# Patient Record
Sex: Female | Born: 1960
Health system: Southern US, Community
[De-identification: ages and names within clinical notes are randomized; demographics above are authoritative.]

## PROBLEM LIST (undated history)

## (undated) DIAGNOSIS — F32A Depression, unspecified: Secondary | ICD-10-CM

## (undated) DIAGNOSIS — F329 Major depressive disorder, single episode, unspecified: Secondary | ICD-10-CM

## (undated) DIAGNOSIS — G9081 Serotonin syndrome: Secondary | ICD-10-CM

## (undated) DIAGNOSIS — Z8619 Personal history of other infectious and parasitic diseases: Secondary | ICD-10-CM

## (undated) DIAGNOSIS — I341 Nonrheumatic mitral (valve) prolapse: Secondary | ICD-10-CM

## (undated) DIAGNOSIS — E785 Hyperlipidemia, unspecified: Secondary | ICD-10-CM

## (undated) DIAGNOSIS — K589 Irritable bowel syndrome without diarrhea: Secondary | ICD-10-CM

## (undated) DIAGNOSIS — G2579 Other drug induced movement disorders: Secondary | ICD-10-CM

## (undated) DIAGNOSIS — M797 Fibromyalgia: Secondary | ICD-10-CM

## (undated) DIAGNOSIS — F419 Anxiety disorder, unspecified: Secondary | ICD-10-CM

## (undated) DIAGNOSIS — R002 Palpitations: Secondary | ICD-10-CM

## (undated) DIAGNOSIS — K219 Gastro-esophageal reflux disease without esophagitis: Secondary | ICD-10-CM

## (undated) DIAGNOSIS — M1990 Unspecified osteoarthritis, unspecified site: Secondary | ICD-10-CM

## (undated) DIAGNOSIS — K8020 Calculus of gallbladder without cholecystitis without obstruction: Secondary | ICD-10-CM

## (undated) DIAGNOSIS — M419 Scoliosis, unspecified: Secondary | ICD-10-CM

## (undated) HISTORY — DX: Unspecified osteoarthritis, unspecified site: M19.90

## (undated) HISTORY — DX: Scoliosis, unspecified: M41.9

## (undated) HISTORY — DX: Palpitations: R00.2

## (undated) HISTORY — DX: Nonrheumatic mitral (valve) prolapse: I34.1

## (undated) HISTORY — DX: Calculus of gallbladder without cholecystitis without obstruction: K80.20

## (undated) HISTORY — DX: Fibromyalgia: M79.7

## (undated) HISTORY — DX: Other drug induced movement disorders: G25.79

## (undated) HISTORY — DX: Gastro-esophageal reflux disease without esophagitis: K21.9

## (undated) HISTORY — DX: Personal history of other infectious and parasitic diseases: Z86.19

## (undated) HISTORY — DX: Serotonin syndrome: G90.81

## (undated) HISTORY — DX: Hyperlipidemia, unspecified: E78.5

## (undated) HISTORY — DX: Anxiety disorder, unspecified: F41.9

## (undated) HISTORY — DX: Irritable bowel syndrome, unspecified: K58.9

---
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
## 1995-11-28 HISTORY — PX: CHOLECYSTECTOMY: SHX55

## 2002-01-26 ENCOUNTER — Emergency Department (HOSPITAL_COMMUNITY): Admission: EM | Admit: 2002-01-26 | Discharge: 2002-01-26 | Payer: Self-pay | Admitting: Emergency Medicine

## 2002-01-26 ENCOUNTER — Encounter: Payer: Self-pay | Admitting: Emergency Medicine

## 2002-08-06 ENCOUNTER — Encounter: Payer: Self-pay | Admitting: Internal Medicine

## 2002-08-06 ENCOUNTER — Encounter: Admission: RE | Admit: 2002-08-06 | Discharge: 2002-08-06 | Payer: Self-pay | Admitting: Internal Medicine

## 2003-06-01 ENCOUNTER — Emergency Department (HOSPITAL_COMMUNITY): Admission: EM | Admit: 2003-06-01 | Discharge: 2003-06-02 | Payer: Self-pay | Admitting: Emergency Medicine

## 2004-03-03 ENCOUNTER — Other Ambulatory Visit: Admission: RE | Admit: 2004-03-03 | Discharge: 2004-03-03 | Payer: Self-pay | Admitting: Family Medicine

## 2005-01-13 ENCOUNTER — Ambulatory Visit: Payer: Self-pay | Admitting: Internal Medicine

## 2005-01-20 ENCOUNTER — Ambulatory Visit: Payer: Self-pay | Admitting: Internal Medicine

## 2005-03-06 ENCOUNTER — Ambulatory Visit: Payer: Self-pay | Admitting: Family Medicine

## 2005-03-08 ENCOUNTER — Emergency Department: Payer: Self-pay | Admitting: Emergency Medicine

## 2005-04-11 ENCOUNTER — Ambulatory Visit: Payer: Self-pay | Admitting: Internal Medicine

## 2005-05-04 ENCOUNTER — Ambulatory Visit: Payer: Self-pay | Admitting: Internal Medicine

## 2005-08-22 ENCOUNTER — Ambulatory Visit (HOSPITAL_COMMUNITY): Admission: RE | Admit: 2005-08-22 | Discharge: 2005-08-22 | Payer: Self-pay | Admitting: *Deleted

## 2005-08-26 ENCOUNTER — Encounter: Admission: RE | Admit: 2005-08-26 | Discharge: 2005-08-26 | Payer: Self-pay | Admitting: *Deleted

## 2006-06-20 ENCOUNTER — Ambulatory Visit: Payer: Self-pay | Admitting: Family Medicine

## 2007-07-15 ENCOUNTER — Telehealth: Payer: Self-pay | Admitting: Internal Medicine

## 2007-07-15 DIAGNOSIS — R002 Palpitations: Secondary | ICD-10-CM | POA: Insufficient documentation

## 2007-07-30 ENCOUNTER — Ambulatory Visit: Payer: Self-pay | Admitting: Cardiology

## 2007-07-30 ENCOUNTER — Ambulatory Visit: Payer: Self-pay

## 2007-07-30 LAB — CONVERTED CEMR LAB
Basophils Relative: 0.6 % (ref 0.0–1.0)
Eosinophils Absolute: 0.2 10*3/uL (ref 0.0–0.6)
Lymphocytes Relative: 22 % (ref 12.0–46.0)
Monocytes Relative: 6.1 % (ref 3.0–11.0)
Neutro Abs: 5.2 10*3/uL (ref 1.4–7.7)
Platelets: 315 10*3/uL (ref 150–400)
RBC: 4.31 M/uL (ref 3.87–5.11)
TSH: 0.9 microintl units/mL (ref 0.35–5.50)

## 2007-10-05 ENCOUNTER — Telehealth: Payer: Self-pay | Admitting: Internal Medicine

## 2008-04-02 ENCOUNTER — Telehealth (INDEPENDENT_AMBULATORY_CARE_PROVIDER_SITE_OTHER): Payer: Self-pay | Admitting: *Deleted

## 2008-04-27 ENCOUNTER — Ambulatory Visit: Payer: Self-pay | Admitting: Family Medicine

## 2008-04-27 DIAGNOSIS — M129 Arthropathy, unspecified: Secondary | ICD-10-CM | POA: Insufficient documentation

## 2008-08-18 ENCOUNTER — Encounter: Admission: RE | Admit: 2008-08-18 | Discharge: 2008-08-18 | Payer: Self-pay | Admitting: Gastroenterology

## 2010-01-21 ENCOUNTER — Other Ambulatory Visit: Admission: RE | Admit: 2010-01-21 | Discharge: 2010-01-21 | Payer: Self-pay | Admitting: Family Medicine

## 2010-04-13 ENCOUNTER — Emergency Department (HOSPITAL_COMMUNITY): Admission: EM | Admit: 2010-04-13 | Discharge: 2010-04-13 | Payer: Self-pay | Admitting: Emergency Medicine

## 2010-10-07 ENCOUNTER — Ambulatory Visit: Payer: Self-pay | Admitting: Internal Medicine

## 2010-10-07 DIAGNOSIS — S335XXA Sprain of ligaments of lumbar spine, initial encounter: Secondary | ICD-10-CM | POA: Insufficient documentation

## 2010-10-07 DIAGNOSIS — M4120 Other idiopathic scoliosis, site unspecified: Secondary | ICD-10-CM | POA: Insufficient documentation

## 2010-12-08 ENCOUNTER — Ambulatory Visit
Admission: RE | Admit: 2010-12-08 | Discharge: 2010-12-08 | Payer: Self-pay | Source: Home / Self Care | Attending: Family Medicine | Admitting: Family Medicine

## 2010-12-08 DIAGNOSIS — J069 Acute upper respiratory infection, unspecified: Secondary | ICD-10-CM | POA: Insufficient documentation

## 2010-12-27 NOTE — Assessment & Plan Note (Signed)
Summary: BACK PAIN/CLE   Vital Signs:  Patient profile:   50 year old female Weight:      188 pounds Temp:     98.5 degrees F oral Pulse rate:   92 / minute Pulse rhythm:   regular BP sitting:   112 / 90  (left arm) Cuff size:   regular  Vitals Entered By: Delilah Shan CMA Duncan Dull) (October 07, 2010 4:14 PM) CC: Back pain   History of Present Illness: CC: back pain  presents with husband  4d h/o back pain R lower back/buttock.  pain down into hip.  throbbing pain.  No shooting pain down leg, no tingling/numbness/weakness.  No fevers/chills. No injury or falls on back.  Started when sweeping floor.  no bowel/bladder incontinence.  Trouble sleeping.  Tried tylenol, alleve and tramadol which isn't helping.  Alleve causes stomach upset.  tried flexeril as well.  heating pad hasn't helped.  h/o scoliosis in lower back around same area.  has had pain like this in past, usually resolves in 2-3 days.  PCP is [facility].  comes here because closer to home.  Current Medications (verified): 1)  Pantoprazole Sodium 40 Mg  Tbec (Pantoprazole Sodium) .... Take 1 Tablet By Mouth Once A Day 2)  Tramadol Hcl 50 Mg  Tabs (Tramadol Hcl) .... Take One Twice Daily As Needed 3)  Flexeril 10 Mg  Tabs (Cyclobenzaprine Hcl) .... 1/2 To 1 Every 8 Hrs As Needed Muscle Pain 4)  Alprazolam 0.25 Mg Tabs (Alprazolam) .... Take 1 By Mouth As Needed  Allergies: 1)  Nsaids  Past History:  Past Medical History: fibromyalgia arthritis h/o scoliosis  Past Surgical History: C/S cholecystectomy appendectomy  Social History: no smoking  Review of Systems       per HPI  Physical Exam  General:  alert, well-developed, well-nourished, and well-hydrated.  uncomfortable sitting in exam room, moves from 1 buttocks to other Msk:  no tenerness midline or paraspinous mm.  no point tenderness SI or greater trochanter.  + tenderness right mid buttock. pain with flexion/extension.  no pain with  lateral rotation or flexion. Negative SLR bilaterally. tenderness with FABER bilaterlaly R >L Pulses:  2+ dp/ pt bilat Extremities:  no edema Neurologic:  sensation intact to light touch and gait normal.     Impression & Recommendations:  Problem # 1:  LUMBAR SPRAIN AND STRAIN (ICD-847.2) lumbar strain vs mild sciatica.  treat with steroid course as intolerant to NSAIDs, tramadol not helping short course of vicodin for breakthrough pain  Problem # 2:  SCOLIOSIS, LUMBAR SPINE (ICD-737.30) exacerbating #1  Complete Medication List: 1)  Pantoprazole Sodium 40 Mg Tbec (Pantoprazole sodium) .... Take 1 tablet by mouth once a day 2)  Tramadol Hcl 50 Mg Tabs (Tramadol hcl) .... Take one twice daily as needed 3)  Flexeril 10 Mg Tabs (Cyclobenzaprine hcl) .... 1/2 to 1 every 8 hrs as needed muscle pain 4)  Alprazolam 0.25 Mg Tabs (Alprazolam) .... Take 1 by mouth as needed 5)  Prednisone 20 Mg Tabs (Prednisone) .... Take one twice daily for 7 days 6)  Vicodin 5-500 Mg Tabs (Hydrocodone-acetaminophen) .... Take one by mouth q6 hours as needed pain  Patient Instructions: 1)  I think you have inflmamation in your spine causing nerve irritation. 2)  treat with course of steroids for 7 days. 3)  course of vicodin. 4)  Continue flexeril, tramadol, tylenol, and heat. 5)  Good to see you today, call clinic with questions Prescriptions: VICODIN  5-500 MG TABS (HYDROCODONE-ACETAMINOPHEN) take one by mouth q6 hours as needed pain  #20 x 0   Entered and Authorized by:   Eustaquio Boyden  MD   Signed by:   Eustaquio Boyden  MD on 10/07/2010   Method used:   Print then Give to Patient   RxID:   [id number] PREDNISONE 20 MG TABS (PREDNISONE) take one twice daily for 7 days  #14 x 0   Entered and Authorized by:   Eustaquio Boyden  MD   Signed by:   Eustaquio Boyden  MD on 10/07/2010   Method used:   Electronically to        [facility]* (retail)       [address]       [place], [place]   [address]       Ph: [phone]       Fax: [phone]   RxID:   [id number]    Orders Added: 1)  Est. Patient Level III [64403]    Current Allergies (reviewed today): NSAIDS

## 2010-12-29 NOTE — Assessment & Plan Note (Signed)
Summary: ?SINUS INFECTION/CLE   Vital Signs:  Patient profile:   50 year old female Weight:      189 pounds Temp:     98.7 degrees F oral Pulse rate:   88 / minute Pulse rhythm:   regular BP sitting:   110 / 78  (left arm) Cuff size:   large  Vitals Entered By: Sydell Axon LPN (December 08, 2010 3:38 PM) CC: Has had a cold and fever X 3 weeks   History of Present Illness: Pt has had cold for three weeks. It began as congestion, ST, swollen glands and intestinal trouble. Now having fevers. The past few days has had white spot on the back of the throat. She has had 99.5 temp for the last few days at night, sha has cough productive of unknown color sputum. She has headache in the posterior occipital area with swollen glands behind the ears, ear congestion that has improved, rhinitis that is clear, ST that continues and cough productive from PND. She has had stomach upset with one day of N/V initially which has improved. She is achey and has been.  Problems Prior to Update: 1)  Scoliosis, Lumbar Spine  (ICD-737.30) 2)  Lumbar Sprain and Strain  (ICD-847.2) 3)  Unspecified Arthropathy Multiple Sites  (ICD-716.99) 4)  Palpitations  (ICD-785.1)  Medications Prior to Update: 1)  Pantoprazole Sodium 40 Mg  Tbec (Pantoprazole Sodium) .... Take 1 Tablet By Mouth Once A Day 2)  Tramadol Hcl 50 Mg  Tabs (Tramadol Hcl) .... Take One Twice Daily As Needed 3)  Flexeril 10 Mg  Tabs (Cyclobenzaprine Hcl) .... 1/2 To 1 Every 8 Hrs As Needed Muscle Pain 4)  Alprazolam 0.25 Mg Tabs (Alprazolam) .... Take 1 By Mouth As Needed 5)  Prednisone 20 Mg Tabs (Prednisone) .... Take One Twice Daily For 7 Days 6)  Vicodin 5-500 Mg Tabs (Hydrocodone-Acetaminophen) .... Take One By Mouth Q6 Hours As Needed Pain  Current Medications (verified): 1)  Pantoprazole Sodium 40 Mg  Tbec (Pantoprazole Sodium) .... Take 1 Tablet By Mouth Once A Day 2)  Tramadol Hcl 50 Mg  Tabs (Tramadol Hcl) .... Take One Twice Daily As  Needed 3)  Flexeril 10 Mg  Tabs (Cyclobenzaprine Hcl) .... 1/2 To 1 Every 8 Hrs As Needed Muscle Pain 4)  Alprazolam 0.25 Mg Tabs (Alprazolam) .... Take 1 By Mouth As Needed  Allergies: 1)  Nsaids  Physical Exam  General:  Well-developed,well-nourished,in no acute distress; alert,appropriate and cooperative throughout examination, conjgested and coughing some. Head:  Normocephalic and atraumatic without obvious abnormalities. Sinuses NT. Eyes:  Conjunctiva clear bilaterally.  Ears:  External ear exam shows no significant lesions or deformities.  Otoscopic examination reveals clear canals, tympanic membranes are intact bilaterally without bulging, retraction, inflammation or discharge. Hearing is grossly normal bilaterally. Nose:  External nasal examination shows no deformity or inflammation. Nasal mucosa are pink and moist without lesions or exudates. Min clear discharge with septum deviated left. Mouth:  Oral mucosa and oropharynx without lesions or exudates.  Teeth in good repair. Neck:  No deformities, masses, or tenderness noted. Lungs:  Normal respiratory effort, chest expands symmetrically. Lungs are clear to auscultation, no crackles or wheezes. Heart:  Normal rate and regular rhythm. S1 and S2 normal without gallop, murmur, click, rub or other extra sounds.   Impression & Recommendations:  Problem # 1:  URI (ICD-465.9) Assessment New  See instructions.  Instructed on symptomatic treatment. Call if symptoms persist or worsen.   Complete Medication  List: 1)  Pantoprazole Sodium 40 Mg Tbec (Pantoprazole sodium) .... Take 1 tablet by mouth once a day 2)  Tramadol Hcl 50 Mg Tabs (Tramadol hcl) .... Take one twice daily as needed 3)  Flexeril 10 Mg Tabs (Cyclobenzaprine hcl) .... 1/2 to 1 every 8 hrs as needed muscle pain 4)  Alprazolam 0.25 Mg Tabs (Alprazolam) .... Take 1 by mouth as needed  Patient Instructions: 1)  Take Guaifenesin by going to [facility] or [facility] and getting MUCOUS RELIEF EXPECTORANT (400mg ), take 11/2 tabs by mouth AM and NOON. 2)  Drink lots of fluids anytime taking Guaifenesin.  3)  Take Tyl ES 2 tabs three times a day  4)  Gargle as dir.  5)  RTC or call if sxs worsen.   Orders Added: 1)  Est. Patient Level III [91478]    Current Allergies (reviewed today): NSAIDS

## 2011-02-13 LAB — CBC
Platelets: 282 10*3/uL (ref 150–400)
RBC: 4.37 MIL/uL (ref 3.87–5.11)
WBC: 7.4 10*3/uL (ref 4.0–10.5)

## 2011-02-13 LAB — D-DIMER, QUANTITATIVE: D-Dimer, Quant: 0.22 ug/mL-FEU (ref 0.00–0.48)

## 2011-02-13 LAB — POCT CARDIAC MARKERS
CKMB, poc: <1 ng/mL — ABNORMAL LOW (ref 1.0–8.0)
Myoglobin, poc: 47 ng/mL (ref 12–200)
Troponin i, poc: <0.05 ng/mL (ref 0.00–0.09)

## 2011-02-13 LAB — POCT I-STAT, CHEM 8
Chloride: 109 mEq/L (ref 96–112)
HCT: 40 % (ref 36.0–46.0)
Potassium: 3.7 mEq/L (ref 3.5–5.1)

## 2011-04-11 ENCOUNTER — Ambulatory Visit (INDEPENDENT_AMBULATORY_CARE_PROVIDER_SITE_OTHER): Payer: BC Managed Care – PPO | Admitting: Family Medicine

## 2011-04-11 ENCOUNTER — Encounter: Payer: Self-pay | Admitting: Family Medicine

## 2011-04-11 VITALS — BP 92/70 | HR 88 | Temp 98.2°F | Ht 64.0 in | Wt 179.8 lb

## 2011-04-11 DIAGNOSIS — IMO0001 Reserved for inherently not codable concepts without codable children: Secondary | ICD-10-CM

## 2011-04-11 DIAGNOSIS — M797 Fibromyalgia: Secondary | ICD-10-CM | POA: Insufficient documentation

## 2011-04-11 DIAGNOSIS — Z1231 Encounter for screening mammogram for malignant neoplasm of breast: Secondary | ICD-10-CM

## 2011-04-11 DIAGNOSIS — M419 Scoliosis, unspecified: Secondary | ICD-10-CM

## 2011-04-11 DIAGNOSIS — R002 Palpitations: Secondary | ICD-10-CM

## 2011-04-11 NOTE — Assessment & Plan Note (Signed)
Unchanged. Discussed supplements often used for fibromyalgia along with therapies such as water therapy. Handouts given.   Pt will read them and get back to me.

## 2011-04-11 NOTE — Assessment & Plan Note (Signed)
[facility]                            CARDIOLOGY OFFICE NOTE   NAME:Margaret Wood, Margaret Wood                    MRN:          [id number]  DATE:07/30/2007                            DOB:          09/28/1961    PRIMARY CARE PHYSICIAN:  Karie Schwalbe, MD   REASON FOR CONSULTATION:  Evaluate patient with palpitations.   HISTORY OF PRESENT ILLNESS:  The patient is a 50 year old white female  with a history of palpitations. Ten years ago, she had these during her  pregnancy and hospitalization with twins. She said there was some  cardiac workup but no apparent etiology was identified. She says that  slowly over time her heart rate has continued to rise. She had some  palpitations apparently in 1999 as well and had an echocardiogram when  she was living in [place]. I do have these reports and I do not  see any evidence of abnormality. She says that she has been told in the  past that she had mitral valve prolapse. She is limited by fatigue. She  has some GI disorder that is chronic and does not carry a diagnosis at  this point. She has also been told that she has fibromyalgia. She has  back and neck pain related to arthritis. With this, she is slowly seeing  her activities of daily living reduced and her exercise tolerance become  worse. She is limited by the tiredness. She does notice at rest or maybe  while sitting at the computer or the TV, she will feel her heart racing.  She does not get any chest discomfort with this. However, she feels  weak. Her hands and toes start tingling. She may be mildly nauseated.  She goes and lies down on the couch and has these symptoms resolve after  about a half hour. She is not having any frank syncope with these. She  cannot bring these on. She does notice that her resting heart rate and  heart rate with activity seems always to be elevated and has been  creeping up over the years. She did see Dr.  Alphonsus Sias recently  and was  noted to have sinus tachycardia on an EKG.   PAST MEDICAL HISTORY:  1. Osteoarthritis.  2. Fibromyalgia.  3. Fifth's disease.  4. Stomach disorder.   PAST SURGICAL HISTORY:  1. Cholecystectomy.  2. Appendectomy.  3. C-section.   ALLERGIES:  None.   CURRENT MEDICATIONS:  1. Tramadol.  2. Protonix.   SOCIAL HISTORY:  The patient is married. She has two 53 year old sons.  She does not drink alcohol and does not smoke cigarettes.   FAMILY HISTORY:  Noncontributory for early coronary artery disease,  sudden cardiac death, syncope or heart failure.   REVIEW OF SYSTEMS:  As stated in the HPI and positive for headaches,  decreased sleep, vertigo, poor appetite, nausea, colitis, abdominal  pain, reflux, neck pain, scoliosis, leg pain, seasonal rhinitis.   PHYSICAL EXAMINATION:  The patient is in no acute distress. She is  pleasant. Blood pressure 130/88, heart rate 104 and regular. Weight 182  pounds.  HEENT: Eyelids unremarkable. Pupils equal, round, and reactive to light.  Fundi difficult to visualize. Oral mucosa is unremarkable.  NECK: No jugular venous distention at 45 degrees. Carotid upstroke brisk  and symmetrical. No bruits, no thyromegaly.  LYMPHATICS: No cervical, axillary or inguinal adenopathy.  LUNGS: Clear to auscultation bilaterally.  BACK: No costovertebral angle tenderness.  CHEST: Unremarkable.  HEART: PMI not displaced or sustained. S1, S2 within normal limits. No  S3. No S4. No clicks, rub or murmurs.  ABDOMEN: Flat, positive bowel sounds, normal in frequency and pitch. No  bruits. No rebounds. No guarding. No midline pulsatile mass. No  hepatomegaly or splenomegaly.  SKIN: No rashes, no nodules.  EXTREMITIES: 2+ pulses throughout. No edema, cyanosis or clubbing.  NEURO: Oriented to person, place and time. Cranial nerves II-XII grossly  intact. Motor grossly intact throughout.   EKG: Sinus tachycardia, rate 104, axis within normal limits, RSR  prime  in V1, no acute ST-T wave changes.   ASSESSMENT/PLAN:  1. Dizziness and tachycardia. The patient has symptoms and objective      finding. At this point, I am going to start with labs to include a      CBC, TSH. I will then start with a 24 hour Holter monitor. I want      to see what her heart rate does throughout the day. However, I will      most likely also get an event recorder unless she has an event      while wearing this monitor. I would like to see if there is any      sudden onset to any other rhythm besides sinus tachycardia.  2. Mitral valve prolapse. I do not hear any click. I suggested that      this is most likely a mild      form. No further testing is warranted.  3. Followup. I will see her back based on the results of the above.     Rollene Rotunda, MD, [facility]  Electronically Signed    JH/MedQ  DD: 07/30/2007  DT: 07/30/2007  Job #: [id number]   cc:   Karie Schwalbe, MD

## 2011-04-11 NOTE — Progress Notes (Signed)
50 yo here to establish care.  H/o palpitations- saw Dr. Antoine Poche in 2008 for this complaint. Notes reviewed.  TSH, CBC within normal limits. Holter monitor showed events but they were unremarkable. Pt still has palpitations a few times a day. Not associated with CP, SOB, diaphoresis, blurred vision or dizziness.  Fibromyalgia- takes Tramadol and Tylenol as needed. Tried SSRIs in past but could not tolerate side effects. Pain is mainly in hips and shoulders, under relatively good control right now.  Well woman- G1P2. Pap smear last February, no h/o abnormal pap smears. Has never had a mammogram.  The PMH, PSH, Social History, Family History, Medications, and allergies have been reviewed in [facility], and have been updated if relevant.  ROS: Patient reports no  vision/ hearing changes,anorexia, weight change, fever ,adenopathy, persistant / recurrent hoarseness, swallowing issues, chest pain, edema,persistant / recurrent cough, hemoptysis, dyspnea(rest, exertional, paroxysmal nocturnal), gastrointestinal  bleeding (melena, rectal bleeding), abdominal pain, excessive heart burn, GU symptoms(dysuria, hematuria, pyuria, voiding/incontinence  Issues) syncope, focal weakness, severe memory loss, concerning skin lesions, depression, anxiety, abnormal bruising/bleeding, major joint swelling, breast masses or abnormal vaginal bleeding.    Physical exam: BP 92/70  Pulse 88  Temp(Src) 98.2 F (36.8 C) (Oral)  Ht 5\' 4"  (1.626 m)  Wt 179 lb 12.8 oz (81.557 kg)  BMI 30.86 kg/m2  LMP 04/01/2011 The patient is in no acute distress. She is pleasant. HEENT: Eyelids unremarkable. Pupils equal, round, and reactive to light. Fundi difficult to visualize. Oral mucosa is unremarkable. NECK: No jugular venous distention at 45 degrees. Carotid upstroke brisk and symmetrical. No bruits, no thyromegaly. LYMPHATICS: No cervical, axillary or inguinal adenopathy. LUNGS: Clear to auscultation bilaterally. BACK: No  costovertebral angle tenderness. CHEST: Unremarkable. HEART: PMI not displaced or sustained. S1, S2 within normal limits. No S3. No S4. No clicks, rub or murmurs. ABDOMEN: Flat, positive bowel sounds, normal in frequency and pitch. No bruits. No rebounds. No guarding. No midline pulsatile mass. No hepatomegaly or splenomegaly. SKIN: No rashes, no nodules. EXTREMITIES: 2+ pulses throughout. No edema, cyanosis or clubbing. NEURO: Oriented to person, place and time. Cranial nerves II-XII grossly intact. Motor grossly intact throughout.

## 2011-04-11 NOTE — Assessment & Plan Note (Signed)
Unchanged. We can either refer back to cardiology or start low dose metoprolol. BP is rather low today so I would prefer to wait. Given pt information to read about metoprolol and she will get back to me about what she would prefer to do (referral).

## 2011-04-11 NOTE — Patient Instructions (Signed)
Please stop by to see Margaret Wood on your way out.  Here is some information about Metoprolol that can help with palpitations.   IMPORTANT: HOW TO USE THIS INFORMATION:  This is a summary and does NOT have all possible information about this product. This information does not assure that this product is safe, effective, or appropriate for you. This information is not individual medical advice and does not substitute for the advice of your health care professional. Always ask your health care professional for complete information about this product and your specific health needs.    METOPROLOL - ORAL (met-oh-PRO-lol)    COMMON BRAND NAME(S): Lopressor    WARNING:  Do not stop taking this medication without consulting your doctor. Some conditions may become worse when you suddenly stop this drug. Some people who have suddenly stopped taking similar drugs have had chest pain, heart attack, and irregular heartbeat. If your doctor decides you should no longer use this drug, he or she may direct you to gradually decrease your dose over 1 to 2 weeks. When gradually stopping this medication, it is recommended that you temporarily limit physical activity to decrease strain on the heart. Get medical help right away if you develop chest pain/tightness/pressure, chest pain spreading to the jaw/neck/arm, unusual sweating, trouble breathing, or fast/irregular heartbeat.    USES:  Metoprolol is used with or without other medications to treat high blood pressure (hypertension). Lowering high blood pressure helps prevent strokes, heart attacks, and kidney problems. This medication is also used to treat chest pain (angina) and to improve survival after a heart attack. Metoprolol belongs to a class of drugs known as beta blockers. It works by blocking the action of certain natural chemicals in your body, such as epinephrine, on the heart and blood vessels. This effect lowers the heart rate, blood pressure, and strain on the  heart.    OTHER USES:  This section contains uses of this drug that are not listed in the approved professional labeling for the drug but that may be prescribed by your health care professional. Use this drug for a condition that is listed in this section only if it has been so prescribed by your health care professional. This medication may also be used for irregular heartbeats, heart failure, migraine headache prevention, tremors and other conditions as determined by your doctor.    HOW TO USE:  See also Warning section. Take this medication by mouth, with or right after a meal, as directed by your doctor, usually once or twice a day. The dosage is based on your medical condition and response to treatment. To reduce your risk of side effects, your doctor may direct you to start this medication at a low dose and gradually increase your dose. Follow your doctor's instructions carefully. Use this medication regularly to get the most benefit from it. To help you remember, take it at the same time(s) each day. Do not suddenly stop taking this medication without consulting your doctor. Your condition may become worse when the drug is suddenly stopped. For the treatment of high blood pressure, it may take several weeks before you get the full benefit of this drug. It is important to continue taking this medication even if you feel well. Most people with high blood pressure do not feel sick. To prevent chest pain, a second heart attack, or migraine headaches, it is very important to take this medication regularly as prescribed. This drug should not be used to treat chest pain or migraines  when they occur. Use other medications to relieve sudden attacks as directed by your doctor (for example, nitroglycerin tablets placed under the tongue for chest pain, "triptan" drugs such as sumatriptan for migraines). Consult your doctor or pharmacist for details. Tell your doctor if your condition does not improve or if it worsens  (for example, if your routine blood pressure readings remain high or increase, if your chest pain or migraines occur more often).    SIDE EFFECTS:  Drowsiness, dizziness, tiredness, diarrhea, and slow heartbeat may occur. Decreased sexual ability has been reported infrequently. If any of these effects persist or worsen, tell your doctor or pharmacist promptly. To reduce the risk of dizziness and lightheadedness, get up slowly when rising from a sitting or lying position. This drug may reduce blood flow to your hands and feet, causing them to feel cold. Smoking may worsen this effect. Dress warmly and avoid tobacco use. Remember that your doctor has prescribed this medication because he or she has judged that the benefit to you is greater than the risk of side effects. Many people using this medication do not have serious side effects. Tell your doctor immediately if any of these unlikely but serious side effects occur: very slow heartbeat, severe dizziness, fainting, blue fingers/toes, trouble breathing, new or worsening symptoms of heart failure (such as swelling ankles/feet, severe tiredness, shortness of breath, unexplained/sudden weight gain), mental/mood changes (such as confusion, mood swings, depression). A very serious allergic reaction to this drug is rare. However, get medical help right away if you notice any symptoms of a serious allergic reaction, including: rash, itching/swelling (especially of the face/tongue/throat), severe dizziness, trouble breathing. This is not a complete list of possible side effects. If you notice other effects not listed above, contact your doctor or pharmacist. In the [place] - Call your doctor for medical advice about side effects. You may report side effects to FDA at 1-800-FDA-1088. In [place] - Call your doctor for medical advice about side effects. You may report side effects to [facility] [place] at [phone].    PRECAUTIONS:  Before taking metoprolol, tell your doctor or  pharmacist if you are allergic to it; or to other beta-blockers (such as atenolol, propranolol); or if you have any other allergies. This product may contain inactive ingredients, which can cause allergic reactions or other problems. Talk to your pharmacist for more details. Before using this medication, tell your doctor or pharmacist your medical history, especially of: certain types of heart rhythm problems (such as a slow heartbeat, sick sinus syndrome, second- or third-degree atrioventricular block), breathing problems (such as asthma, chronic bronchitis, emphysema), liver disease, serious allergic reactions, including those needing treatment with epinephrine, blood circulation problems (such as Raynaud's disease, peripheral vascular disease), mental/mood disorders (such as depression), a certain muscle disease (myasthenia gravis). If you have diabetes, this product may prevent the fast/pounding heartbeat you would usually feel when your blood sugar level falls too low (hypoglycemia). Other symptoms of a low blood sugar level, such as dizziness and sweating, are unaffected by this drug. This product may also make it harder to control your blood sugar levels. Check your blood sugar levels regularly as directed by your doctor. Tell your doctor immediately if you have symptoms of high blood sugar such as increased thirst/urination. Your doctor may need to adjust your diabetes medication, exercise program, or diet. Before having surgery, tell your doctor or dentist about all the products you use (including prescription drugs, nonprescription drugs, and herbal products). This drug may make  you dizzy or drowsy. Do not drive, use machinery, or do any activity that requires alertness until you are sure you can perform such activities safely. Limit alcoholic beverages. During pregnancy, this medication should be used only when clearly needed. It may harm an unborn baby. Discuss the risks and benefits with your doctor.  This drug passes into breast milk. Discuss the risks and benefits with your doctor before breast-feeding.    DRUG INTERACTIONS:  Drug interactions may change how your medications work or increase your risk for serious side effects. This document does not contain all possible drug interactions. Keep a list of all the products you use (including prescription/nonprescription drugs and herbal products) and share it with your doctor and pharmacist. Do not start, stop, or change the dosage of any medicines without your doctor's approval.    OVERDOSE:  If overdose is suspected, contact a poison control center or emergency room immediately. [place] residents can call the [place] National Poison Hotline at 856-832-3034. [place] residents can call a Technical sales engineer center. Symptoms of overdose may include: very slow heartbeat, severe dizziness, severe weakness, fainting, trouble breathing.    NOTES:  Do not share this medication with others. Talk with your doctor about lifestyle changes that may help this medication work better (such as stress reduction programs, exercise, and dietary changes). Have your blood pressure and pulse (heart rate) checked regularly while taking this medication. Learn how to check your own blood pressure and pulse at home, and share the results with your doctor. Laboratory and/or medical tests (such as liver function tests) may be performed periodically to monitor your progress or check for side effects. Consult your doctor for more details.    MISSED DOSE:  If you miss a dose, take it as soon as you remember. If it is near the time of the next dose, skip the missed dose and resume your usual dosing schedule. Do not double the dose to catch up.    STORAGE:  Store at room temperature away from light and moisture. Do not store in the bathroom. Keep all medicines away from children and pets. Do not flush medications down the toilet or pour them into a drain unless instructed to do so.  Properly discard this product when it is expired or no longer needed. Consult your pharmacist or local waste disposal company for more details about how to safely discard your product.    MEDICAL ALERT:  Your condition can cause complications in a medical emergency. For information about enrolling in [place], call [phone] ([place]) or [phone] ([place]).    Information last revised August 2010 Copyright(c) 2010 [employer].

## 2011-04-14 NOTE — Procedures (Signed)
EEG NUMBER:  [id number]   CLINICAL HISTORY:  The patient is a 50 year old woman with a history of tics  and tremors and the patient has generalized tics and left-sided tremors.  Study is being done to look for the presence of seizures.   PROCEDURE:  The tracing was carried out on a 32-channel digital Cadwell  recorder reformatted to 16-channel montages with 1 devoted to EKG.  The  patient was awake during the recording.  The International 10/20 System of  lead placement was used.  Medications include Zoloft and Protonix.   DESCRIPTION OF FINDINGS:  Dominant frequency is 10- to 11-Hz, well-  modulated, well-regulated 35- to 55-microvolt alpha range activity that was  prominent the posterior regions, but broadly distributed and attenuates with  eye opening.   Background activity is a mixture of 10-microvolt beta range activity  prominently in the frontal region.   Hyperventilation and photic stimulation failed to induce changes.  There was  no focal slowing.  There was no interictal epileptiform activity in the form  of spikes or sharp waves.   EKG showed a regular sinus rhythm with ventricular response of 90 beats per  minute.   IMPRESSION:  Normal waking record.      Deanna Artis. Sharene Skeans, M.D.  Electronically Signed     NWG:NFAO  D:  08/22/2005 15:38:54  T:  08/23/2005 08:52:26  Job #:  [id number]   cc:   Bevelyn Buckles. Nash Shearer, M.D.  Fax: [phone]

## 2011-05-22 ENCOUNTER — Encounter: Payer: Self-pay | Admitting: Family Medicine

## 2011-05-23 ENCOUNTER — Encounter: Payer: Self-pay | Admitting: Family Medicine

## 2011-05-23 ENCOUNTER — Ambulatory Visit (INDEPENDENT_AMBULATORY_CARE_PROVIDER_SITE_OTHER): Payer: BC Managed Care – PPO | Admitting: Family Medicine

## 2011-05-23 VITALS — BP 120/76 | HR 88 | Temp 98.4°F | Ht 64.0 in | Wt 177.2 lb

## 2011-05-23 DIAGNOSIS — J069 Acute upper respiratory infection, unspecified: Secondary | ICD-10-CM

## 2011-05-23 DIAGNOSIS — J029 Acute pharyngitis, unspecified: Secondary | ICD-10-CM

## 2011-05-23 NOTE — Assessment & Plan Note (Addendum)
Concern given recent abx course, however no fevers and exam overall normal.   Don't think recurrent bacterial infection as of now, ?viral infection given cough. Supportive care as per instructions.  Discussed ceftin should have covered strep throat well. Red flags to notify us discussed to make [place] consider other process (sinusitis, abscess, soft tissue infx etc), pt agrees.

## 2011-05-23 NOTE — Progress Notes (Signed)
  Subjective:    Patient ID: Margaret Wood, female    DOB: 03-14-1961, 50 y.o.   MRN: [id number]  HPI CC: "massive ST"  2 weeks ago, dx with rapid strep (elevated WBCs as well), took course of ceftin bid x 10 days and improved.  Feeling better for 4 days then 2d ago bad ST returned.  More pain R side extending into ear.  Endorses hoarseness.  No trismus.  Mild cough, tickle in back of throat.  Feeling somewhat dizzy.  No fevers, but has felt chilly.  No abd pain, n/v/d.  No change in HA.  No RN, congestion.  No other sick contacts at home.  No smokers at home.  No h/o asthma/copd.  Review of Systems Per HPI    Objective:   Physical Exam  Nursing note and vitals reviewed. Constitutional: She appears well-developed and well-nourished. No distress.  HENT:  Head: Normocephalic and atraumatic.  Right Ear: Hearing, tympanic membrane, external ear and ear canal normal.  Left Ear: Hearing, tympanic membrane, external ear and ear canal normal.  Nose: No mucosal edema or rhinorrhea. Right sinus exhibits no maxillary sinus tenderness and no frontal sinus tenderness. Left sinus exhibits no maxillary sinus tenderness and no frontal sinus tenderness.  Mouth/Throat: Uvula is midline, oropharynx is clear and moist and mucous membranes are normal. No oropharyngeal exudate, posterior oropharyngeal edema, posterior oropharyngeal erythema or tonsillar abscesses.       R tonsil slightly enlarged and erythematous compared to left.  No phlegmon, no exudate  Eyes: Conjunctivae and EOM are normal. Pupils are equal, round, and reactive to light. No scleral icterus.  Neck: Normal range of motion and full passive range of motion without pain. Neck supple. No edema, no erythema and normal range of motion present. No thyromegaly present.       No LAD, no neck swelling  Cardiovascular: Normal rate, regular rhythm, normal heart sounds and intact distal pulses.   No murmur heard. Pulmonary/Chest: Effort normal  and breath sounds normal. No respiratory distress. She has no wheezes. She has no rales.  Lymphadenopathy:       Head (right side): No submental, no submandibular, no tonsillar, no preauricular, no posterior auricular and no occipital adenopathy present.       Head (left side): No submental, no submandibular, no tonsillar, no preauricular, no posterior auricular and no occipital adenopathy present.    She has no cervical adenopathy.  Skin: Skin is warm and dry. No rash noted.          Assessment & Plan:

## 2011-05-23 NOTE — Patient Instructions (Signed)
Sounds like could be viral infection. Update [place] if any fever >101, worsening hoarseness, trouble opening mouth, or evident swelling or redness in neck. Push fluids and plenty of rest. Salt water gargles. Suck on cold things like popsicles or warm things like herbal teas (whichever soothes the throat better). Good to see you today, call clinic with questions.

## 2011-08-03 ENCOUNTER — Other Ambulatory Visit: Payer: Self-pay | Admitting: *Deleted

## 2011-08-03 MED ORDER — ALPRAZOLAM 0.25 MG PO TABS: 0.2500 mg | ORAL_TABLET | Freq: Every evening | ORAL | Status: DC | PRN

## 2011-08-03 MED ORDER — TRAMADOL HCL 50 MG PO TABS: 50.0000 mg | ORAL_TABLET | Freq: Two times a day (BID) | ORAL | Status: DC | PRN

## 2011-08-03 NOTE — Telephone Encounter (Signed)
Pt is asking that additional refills be added to both scripts.  She is ok to wait until Dr. Dayton Martes returns on Monday.  Please let her know when meds have been called in.

## 2011-08-08 NOTE — Telephone Encounter (Signed)
Xanax called to pharmacy, [facility] advising pt that meds had been sent in.

## 2011-08-09 ENCOUNTER — Telehealth: Payer: Self-pay | Admitting: Family Medicine

## 2011-08-09 NOTE — Telephone Encounter (Signed)
Patient advised.

## 2011-08-09 NOTE — Telephone Encounter (Signed)
Please call regarding questions on medications for trimadole and xanax.  They have questions about refills for doctor.  Pt can be called back at [phone] or cell [phone].

## 2011-08-09 NOTE — Telephone Encounter (Signed)
Patient wanted to know why she couldn't get more then one month supply on her xanax and tramadol. I explained to her that we only give one month supplies at a time at this office and it was nothing against her we do it for every patient. Also, her pervious Dr. Eulah Citizen give her 0.5 mg tablets of the xanax and let her cut them in half for 0.25 mg patient wants to know if their is anyway you could do this b/c it is more cost effective

## 2011-08-09 NOTE — Telephone Encounter (Signed)
Yes I am ok with prescribing 0.5 mg tablets and cutting them in half. I agree with your explanation.

## 2011-08-31 ENCOUNTER — Other Ambulatory Visit: Payer: Self-pay | Admitting: *Deleted

## 2011-08-31 MED ORDER — TRAMADOL HCL 50 MG PO TABS: 50.0000 mg | ORAL_TABLET | Freq: Two times a day (BID) | ORAL | Status: DC | PRN

## 2011-08-31 MED ORDER — ALPRAZOLAM 0.25 MG PO TABS: 0.2500 mg | ORAL_TABLET | Freq: Every evening | ORAL | Status: DC | PRN

## 2011-08-31 NOTE — Telephone Encounter (Signed)
Rx for Alprazolam called to [facility].

## 2011-09-18 ENCOUNTER — Encounter: Payer: Self-pay | Admitting: Family Medicine

## 2011-09-18 ENCOUNTER — Ambulatory Visit (INDEPENDENT_AMBULATORY_CARE_PROVIDER_SITE_OTHER): Payer: BC Managed Care – PPO | Admitting: Family Medicine

## 2011-09-18 VITALS — BP 115/93 | HR 109 | Temp 97.9°F | Ht 64.0 in | Wt 179.5 lb

## 2011-09-18 DIAGNOSIS — K1379 Other lesions of oral mucosa: Secondary | ICD-10-CM

## 2011-09-18 DIAGNOSIS — K1370 Unspecified lesions of oral mucosa: Secondary | ICD-10-CM

## 2011-09-18 DIAGNOSIS — R002 Palpitations: Secondary | ICD-10-CM

## 2011-09-18 DIAGNOSIS — N926 Irregular menstruation, unspecified: Secondary | ICD-10-CM

## 2011-09-18 NOTE — Patient Instructions (Signed)
Good to see you. Please stop by to see Margaret Wood on your way out. 

## 2011-09-18 NOTE — Progress Notes (Signed)
50 yo here for persistent palpitations. Initially was here for mouth sores but that has resolved.  H/o palpitations- saw Dr. Antoine Poche in 2008 for this complaint. Notes reviewed.  TSH, CBC within normal limits. Holter monitor showed events but they were unremarkable.  Saw me for same complaint in 03/2011.   We were contemplating starting metoprolol but she was hypotensive and orthostatic at the time.  For past month, her palpitations are occuring multiple times per day. Can occur at rest or with exertion. Not associated with pain but she can become dizzy.  No increased caffeine. No increased stessors.    The PMH, PSH, Social History, Family History, Medications, and allergies have been reviewed in [facility], and have been updated if relevant.  ROS: Patient reports no  vision/ hearing changes,anorexia, weight change, fever ,adenopathy, persistant / recurrent hoarseness, swallowing issues, chest pain, edema,persistant / recurrent cough, hemoptysis, dyspnea(rest, exertional, paroxysmal nocturnal), gastrointestinal  bleeding (melena, rectal bleeding), abdominal pain, excessive heart burn, GU symptoms(dysuria, hematuria, pyuria, voiding/incontinence  Issues) syncope, focal weakness, severe memory loss, concerning skin lesions, depression, anxiety, abnormal bruising/bleeding, major joint swelling, breast masses or abnormal vaginal bleeding.    Physical exam: BP 110/70  Pulse 94  Temp(Src) 97.9 F (36.6 C) (Oral)  Ht 5\' 4"  (1.626 m)  Wt 179 lb 8 oz (81.421 kg)  BMI 30.81 kg/m2  LMP 09/04/2011 The patient is in no acute distress. She is pleasant. HEENT: Eyelids unremarkable. Pupils equal, round, and reactive to light. Fundi difficult to visualize. Oral mucosa is unremarkable. NECK: No jugular venous distention at 45 degrees. Carotid upstroke brisk and symmetrical. No bruits, no thyromegaly. LYMPHATICS: No cervical, axillary or inguinal adenopathy. LUNGS: Clear to auscultation bilaterally. BACK:  No costovertebral angle tenderness. CHEST: Unremarkable. HEART: PMI not displaced or sustained. S1, S2 within normal limits. No S3. No S4. No clicks, rub or murmurs.  SKIN: No rashes, no nodules. EXTREMITIES: 2+ pulses throughout. No edema, cyanosis or clubbing. NEURO: Oriented to person, place and time. Cranial nerves II-XII grossly intact. Motor grossly intact throughout.  Assessment and Plan: 1. Palpitations   Deteriorated.   EKG relatively stable from 2008- tachycardia, V1 and short PR.   Still having symptoms of orthostasis so I am hesitant to start metoprolol. Will refer to cardiology ASAP for repeat holter monitor and further work up. The patient indicates understanding of these issues and agrees with the plan.   2. Mouth sore   New.   Resolved, likely aphthous ulcer.

## 2011-09-28 ENCOUNTER — Encounter: Payer: Self-pay | Admitting: Cardiovascular Disease

## 2011-10-02 ENCOUNTER — Encounter: Payer: Self-pay | Admitting: Cardiovascular Disease

## 2011-10-02 ENCOUNTER — Ambulatory Visit (INDEPENDENT_AMBULATORY_CARE_PROVIDER_SITE_OTHER): Payer: BC Managed Care – PPO | Admitting: Cardiovascular Disease

## 2011-10-02 DIAGNOSIS — R002 Palpitations: Secondary | ICD-10-CM

## 2011-10-02 DIAGNOSIS — R0602 Shortness of breath: Secondary | ICD-10-CM

## 2011-10-02 DIAGNOSIS — R0789 Other chest pain: Secondary | ICD-10-CM

## 2011-10-02 DIAGNOSIS — R000 Tachycardia, unspecified: Secondary | ICD-10-CM | POA: Insufficient documentation

## 2011-10-02 MED ORDER — METOPROLOL TARTRATE 25 MG PO TABS: 25.0000 mg | ORAL_TABLET | Freq: Two times a day (BID) | ORAL | Status: DC | PRN

## 2011-10-02 NOTE — Patient Instructions (Addendum)
You are doing well. No medication changes were made. Keep hydrated, high salt intake to keep your blood pressure up  Try metoprolol 1/2 twice a day for palpitations. You can take a full pill if needed for palpitations  Please call us if you have new issues that need to be addressed

## 2011-10-02 NOTE — Assessment & Plan Note (Signed)
Symptoms concerning for APCs or PVCs. She reports that this is what was found on her previous Holter or 4 years ago. She is more symptomatic this month. We have suggested she try low-dose beta blocker such as metoprolol tartrate 12.5 mg b.i.d. titrating upwards as tolerated. I suggested she increase her salt intake as well as fluid intake to maintain a high blood pressure.  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 She may need to replete her potassium and these episodes with bananas. Low potassium can be associated with arrhythmia. I suggested she avoid plain magnesium as this could make her GI condition worse.  If she has side effects from her metoprolol tartrate, she could try either shorter acting propranolol or  bystolic (less side effects). If symptoms get worse, a 30 day monitor could be performed

## 2011-10-02 NOTE — Progress Notes (Signed)
Patient ID: Margaret Wood, female    DOB: 05/07/1961, 50 y.o.   MRN: [id number]  HPI Comments: Very pleasant 50 year old woman with fibromyalgia, history of irritable bowel and dumping syndrome, long history of palpitations with previous Holter in 2008, who presents by referral for evaluation of palpitations.  She reports that she has occasional episodes of patient is described as a flip flopping in her chest. It has been worse over the past month, sometimes up to 20 extra beats per day. Other days she has no significant symptoms. Her husband wonders if it could be from menopause. She does continue to have occasional cycles of "dumping syndrome". She reports having a low potassium in the past. After the episodes of palpitations, sometimes she feels weak, sometimes with funny sensations in her hands. She denies any chest pain, lightheadedness, edema.   She is a nonsmoker, drinks one to 2 cups of coffee per day. She does report that for most of her life, her heart rate has been mildly elevated even at rest  EKG shows normal sinus rhythm with rate 97 beats per minute, no significant ST or T wave changes   Outpatient Encounter Prescriptions as of 10/02/2011  Medication Sig Dispense Refill  . acetaminophen (TYLENOL) 500 MG tablet Take 500 mg by mouth every 6 (six) hours as needed.        . ALPRAZolam (XANAX) 0.25 MG tablet Take 1 tablet (0.25 mg total) by mouth at bedtime as needed.  30 tablet  0  . cyclobenzaprine (FLEXERIL) 10 MG tablet Take 5-10 mg by mouth 3 (three) times daily as needed.       . dicyclomine (BENTYL) 10 MG capsule 1-2 tablets four times daily       . pantoprazole (PROTONIX) 40 MG tablet Take 40 mg by mouth daily.        . traMADol (ULTRAM) 50 MG tablet Take 1 tablet (50 mg total) by mouth 2 (two) times daily as needed.  60 tablet  0    Review of Systems  Constitutional: Negative.   HENT: Negative.   Eyes: Negative.   Respiratory: Negative.   Cardiovascular: Positive for  palpitations.  Gastrointestinal: Negative.   Musculoskeletal: Negative.   Skin: Negative.   Neurological: Negative.   Hematological: Negative.   Psychiatric/Behavioral: Negative.   All other systems reviewed and are negative.    BP 120/80  Pulse 97  Ht 5\' 4"  (1.626 m)  Wt 179 lb (81.194 kg)  BMI 30.73 kg/m2  LMP 09/04/2011  Physical Exam  Nursing note and vitals reviewed. Constitutional: She is oriented to person, place, and time. She appears well-developed and well-nourished.  HENT:  Head: Normocephalic.  Nose: Nose normal.  Mouth/Throat: Oropharynx is clear and moist.  Eyes: Conjunctivae are normal. Pupils are equal, round, and reactive to light.  Neck: Normal range of motion. Neck supple. No JVD present.  Cardiovascular: Normal rate, regular rhythm, S1 normal, S2 normal, normal heart sounds and intact distal pulses.  Exam reveals no gallop and no friction rub.   No murmur heard. Pulmonary/Chest: Effort normal and breath sounds normal. No respiratory distress. She has no wheezes. She has no rales. She exhibits no tenderness.  Abdominal: Soft. Bowel sounds are normal. She exhibits no distension. There is no tenderness.  Musculoskeletal: Normal range of motion. She exhibits no edema and no tenderness.  Lymphadenopathy:    She has no cervical adenopathy.  Neurological: She is alert and oriented to person, place, and time. Coordination normal.  Skin:  Skin is warm and dry. No rash noted. No erythema.  Psychiatric: She has a normal mood and affect. Her behavior is normal. Judgment and thought content normal.         Assessment and Plan

## 2011-10-02 NOTE — Assessment & Plan Note (Signed)
She does report having baseline tachycardia. We have suggested she try metoprolol as detailed above for better heart rate control. She does have occasional shortness of breath with exertion which could be secondary to tachycardia. Goal heart rate would be in the 70s to 80s at rest.

## 2011-10-05 ENCOUNTER — Other Ambulatory Visit: Payer: Self-pay | Admitting: *Deleted

## 2011-10-05 MED ORDER — TRAMADOL HCL 50 MG PO TABS: 50.0000 mg | ORAL_TABLET | Freq: Two times a day (BID) | ORAL | Status: DC | PRN

## 2011-10-05 MED ORDER — ALPRAZOLAM 0.25 MG PO TABS: 0.2500 mg | ORAL_TABLET | Freq: Every evening | ORAL | Status: DC | PRN

## 2011-10-05 NOTE — Telephone Encounter (Signed)
Rx called to [facility]. 

## 2011-10-05 NOTE — Telephone Encounter (Signed)
Both Rx's were last refilled on 08/31/2011.

## 2011-11-03 ENCOUNTER — Other Ambulatory Visit: Payer: Self-pay | Admitting: *Deleted

## 2011-11-04 MED ORDER — TRAMADOL HCL 50 MG PO TABS: 50.0000 mg | ORAL_TABLET | Freq: Two times a day (BID) | ORAL | Status: DC | PRN

## 2011-11-04 MED ORDER — CYCLOBENZAPRINE HCL 10 MG PO TABS: 5.0000 mg | ORAL_TABLET | Freq: Three times a day (TID) | ORAL | Status: DC | PRN

## 2011-11-04 MED ORDER — ALPRAZOLAM 0.25 MG PO TABS: 0.2500 mg | ORAL_TABLET | Freq: Every evening | ORAL | Status: DC | PRN

## 2011-11-06 NOTE — Telephone Encounter (Signed)
Rx for Alprazolam called to [facility], others were sent electronically by Dr. Dayton Martes.

## 2011-12-20 ENCOUNTER — Other Ambulatory Visit: Payer: Self-pay | Admitting: *Deleted

## 2011-12-20 MED ORDER — TRAMADOL HCL 50 MG PO TABS: 50.0000 mg | ORAL_TABLET | Freq: Two times a day (BID) | ORAL | Status: DC | PRN

## 2011-12-20 MED ORDER — ALPRAZOLAM 0.25 MG PO TABS: 0.2500 mg | ORAL_TABLET | Freq: Every evening | ORAL | Status: DC | PRN

## 2011-12-20 NOTE — Telephone Encounter (Signed)
Rx for Alprazolam called to [facility]. 

## 2011-12-20 NOTE — Telephone Encounter (Signed)
Please send back to [facility]

## 2011-12-20 NOTE — Telephone Encounter (Signed)
OK to refill? Last OV 09/18/11. Please send back to [facility].

## 2012-01-22 ENCOUNTER — Other Ambulatory Visit: Payer: Self-pay | Admitting: *Deleted

## 2012-01-22 MED ORDER — TRAMADOL HCL 50 MG PO TABS: 50.0000 mg | ORAL_TABLET | Freq: Two times a day (BID) | ORAL | Status: DC | PRN

## 2012-01-25 ENCOUNTER — Other Ambulatory Visit: Payer: Self-pay | Admitting: *Deleted

## 2012-01-25 MED ORDER — ALPRAZOLAM 0.25 MG PO TABS: 0.2500 mg | ORAL_TABLET | Freq: Every evening | ORAL | Status: DC | PRN

## 2012-01-25 NOTE — Telephone Encounter (Signed)
Medicine called to [facility]. 

## 2012-02-20 ENCOUNTER — Other Ambulatory Visit: Payer: Self-pay | Admitting: *Deleted

## 2012-02-20 MED ORDER — TRAMADOL HCL 50 MG PO TABS: 50.0000 mg | ORAL_TABLET | Freq: Two times a day (BID) | ORAL | Status: DC | PRN

## 2012-02-20 NOTE — Telephone Encounter (Signed)
Received faxed refill request from pharmacy for Tramadol, LR 01/22/12 #60. Is it okay to refill medication?

## 2012-03-13 ENCOUNTER — Other Ambulatory Visit: Payer: Self-pay | Admitting: *Deleted

## 2012-03-13 MED ORDER — PANTOPRAZOLE SODIUM 40 MG PO TBEC: 40.0000 mg | DELAYED_RELEASE_TABLET | Freq: Every day | ORAL | Status: DC

## 2012-03-13 MED ORDER — DICYCLOMINE HCL 10 MG PO CAPS: ORAL_CAPSULE | ORAL | Status: DC

## 2012-03-13 NOTE — Telephone Encounter (Signed)
Refill request from pt, prescribed by previous doctor.  Uses [facility].

## 2012-03-20 ENCOUNTER — Other Ambulatory Visit: Payer: Self-pay

## 2012-03-20 MED ORDER — TRAMADOL HCL 50 MG PO TABS: 50.0000 mg | ORAL_TABLET | Freq: Two times a day (BID) | ORAL | Status: DC | PRN

## 2012-03-20 MED ORDER — ALPRAZOLAM 0.25 MG PO TABS: 0.2500 mg | ORAL_TABLET | Freq: Every evening | ORAL | Status: DC | PRN

## 2012-03-20 NOTE — Telephone Encounter (Addendum)
[facility] faxed request Tramadol 50 mg and Alprazolam 0.25 mg. Pt last seen 09/18/11 and last refill on both meds 02/20/12.Please advise.

## 2012-03-20 NOTE — Telephone Encounter (Signed)
Xanax called to [facility]. 

## 2012-04-19 ENCOUNTER — Other Ambulatory Visit: Payer: Self-pay | Admitting: *Deleted

## 2012-04-19 MED ORDER — ALPRAZOLAM 0.25 MG PO TABS: 0.2500 mg | ORAL_TABLET | Freq: Every evening | ORAL | Status: DC | PRN

## 2012-04-19 NOTE — Telephone Encounter (Signed)
Faxed request from [facility], last filled 03/20/12 for # 90.

## 2012-04-19 NOTE — Telephone Encounter (Signed)
Alprazolam called to pharmacy  

## 2012-04-26 ENCOUNTER — Other Ambulatory Visit: Payer: Self-pay | Admitting: *Deleted

## 2012-04-26 NOTE — Telephone Encounter (Signed)
Faxed refill request from [facility], last filled 60 on 03/20/12.

## 2012-04-29 MED ORDER — TRAMADOL HCL 50 MG PO TABS: 50.0000 mg | ORAL_TABLET | Freq: Two times a day (BID) | ORAL | Status: DC | PRN

## 2012-05-23 ENCOUNTER — Ambulatory Visit: Payer: BC Managed Care – PPO | Admitting: Family Medicine

## 2012-05-29 ENCOUNTER — Ambulatory Visit (INDEPENDENT_AMBULATORY_CARE_PROVIDER_SITE_OTHER): Payer: BC Managed Care – PPO | Admitting: Family Medicine

## 2012-05-29 ENCOUNTER — Encounter: Payer: Self-pay | Admitting: Family Medicine

## 2012-05-29 VITALS — BP 128/84 | HR 76 | Temp 97.9°F | Wt 187.0 lb

## 2012-05-29 DIAGNOSIS — N951 Menopausal and female climacteric states: Secondary | ICD-10-CM | POA: Insufficient documentation

## 2012-05-29 MED ORDER — ALPRAZOLAM 0.25 MG PO TABS: 0.2500 mg | ORAL_TABLET | Freq: Every evening | ORAL | Status: DC | PRN

## 2012-05-29 MED ORDER — PROGESTERONE MICRONIZED 100 MG PO CAPS: ORAL_CAPSULE | ORAL | Status: DC

## 2012-05-29 MED ORDER — ESTROGENS CONJUGATED 0.3 MG PO TABS: ORAL_TABLET | ORAL | Status: DC

## 2012-05-29 MED ORDER — TRAMADOL HCL 50 MG PO TABS: 50.0000 mg | ORAL_TABLET | Freq: Two times a day (BID) | ORAL | Status: DC | PRN

## 2012-05-29 NOTE — Patient Instructions (Addendum)
Good to see you. Please call me in one month with an update of your symptoms.

## 2012-05-29 NOTE — Progress Notes (Signed)
  Subjective:    Patient ID: Margaret Wood, female    DOB: Oct 20, 1961, 51 y.o.   MRN: [id number]  HPI  51 yo here to discuss hot flashes.  Has missed last few periods. Hot flashes so severe, she cannot sleep. Also complains of vaginal dryness. Denies mood swings or HA.  She does not have any personal or family h/o breast, uterine, or ovarian CA. No h/o clotting disorders or DVTs.  Patient Active Problem List  Diagnosis  . UNSPECIFIED ARTHROPATHY MULTIPLE SITES  . SCOLIOSIS, LUMBAR SPINE  . PALPITATIONS  . LUMBAR SPRAIN AND STRAIN  . URI  . Fibromyalgia  . Scoliosis  . Mouth sore  . Tachycardia  . Symptoms, such as flushing, sleeplessness, headache, lack of concentration, associated with the menopause   Past Medical History  Diagnosis Date  . Palpitations   . Fibromyalgia   . Scoliosis   . Arthritis    Past Surgical History  Procedure Date  . Appendectomy 1980  . Cesarean section 1995  . Cholecystectomy 1997   History  Substance Use Topics  . Smoking status: Never Smoker   . Smokeless tobacco: Not on file  . Alcohol Use: No   No family history on file. Allergies  Allergen Reactions  . Nsaids     REACTION: abd pain   Current Outpatient Prescriptions on File Prior to Visit  Medication Sig Dispense Refill  . acetaminophen (TYLENOL) 500 MG tablet Take 500 mg by mouth every 6 (six) hours as needed.        . cyclobenzaprine (FLEXERIL) 10 MG tablet Take 0.5-1 tablets (5-10 mg total) by mouth 3 (three) times daily as needed.  30 tablet  0  . dicyclomine (BENTYL) 10 MG capsule 1-2 tablets four times daily  240 capsule  5  . metoprolol tartrate (LOPRESSOR) 25 MG tablet Take 1 tablet (25 mg total) by mouth 2 (two) times daily as needed.  60 tablet  11  . pantoprazole (PROTONIX) 40 MG tablet Take 1 tablet (40 mg total) by mouth daily.  30 tablet  5  . estrogens, conjugated, (PREMARIN) 0.3 MG tablet Take 1 tablet by mouth daily.  30 tablet  11  . progesterone  (PROMETRIUM) 100 MG capsule 1 tablet by mouth nightly  30 capsule  11   The PMH, PSH, Social History, Family History, Medications, and allergies have been reviewed in [facility], and have been updated if relevant.   Review of Systems See HPI    Objective:   Physical Exam BP 128/84  Pulse 76  Temp 97.9 F (36.6 C)  Wt 187 lb (84.823 kg) Gen:  Alert, pleasant, NAD Psych:  Good eye contact.  Not anxious or depressed appearing.    Assessment & Plan:   1. Symptoms, such as flushing, sleeplessness, headache, lack of concentration, associated with the menopause    >25 min spent with face to face with patient, >50% counseling and/or coordinating care Discussed risks and benefits of HRT.  Pt has a uterus and needs progesterone with estrogen to prevent endometrial hyperplasia (20-50% of patients on unopposed estrogen for 1 yr will develop hyperplasia). Start Prometrium 100 mg qhs with Premarin 0.3 mg daily. The patient indicates understanding of these issues and agrees with the plan.

## 2012-06-05 ENCOUNTER — Telehealth: Payer: Self-pay | Admitting: *Deleted

## 2012-06-05 MED ORDER — ESTROGENS CONJUGATED 0.625 MG PO TABS: ORAL_TABLET | ORAL | Status: DC

## 2012-06-05 NOTE — Telephone Encounter (Signed)
Advised patient

## 2012-06-05 NOTE — Telephone Encounter (Signed)
Patient called stating that she was recently seen and given Premarin and Progesterone. Patient stated that she is still having the symptoms that the medications were suppose to help with and they seem to be worse. Please advise if she should increase the medication or change to something else?

## 2012-06-05 NOTE — Telephone Encounter (Signed)
Let's try to increase dose of estrogen- new rx sent to pharmacy. Continue current dose of prometrium (she must take this while taking estrogen). Keep [place] posted with symptoms.

## 2012-06-27 ENCOUNTER — Other Ambulatory Visit: Payer: Self-pay | Admitting: *Deleted

## 2012-06-27 MED ORDER — ALPRAZOLAM 0.25 MG PO TABS: 0.2500 mg | ORAL_TABLET | Freq: Every evening | ORAL | Status: DC | PRN

## 2012-06-27 MED ORDER — TRAMADOL HCL 50 MG PO TABS: 50.0000 mg | ORAL_TABLET | Freq: Two times a day (BID) | ORAL | Status: DC | PRN

## 2012-06-27 NOTE — Telephone Encounter (Signed)
Faxed refill request from [facility], last filled 04/19/12.

## 2012-06-27 NOTE — Telephone Encounter (Signed)
Faxed refill request from [facility], last filled 05/29/12.

## 2012-06-27 NOTE — Telephone Encounter (Signed)
Medicine called to [facility]. 

## 2012-07-30 ENCOUNTER — Other Ambulatory Visit: Payer: Self-pay | Admitting: *Deleted

## 2012-07-30 MED ORDER — ALPRAZOLAM 0.25 MG PO TABS: 0.2500 mg | ORAL_TABLET | Freq: Every evening | ORAL | Status: DC | PRN

## 2012-07-30 MED ORDER — TRAMADOL HCL 50 MG PO TABS: 50.0000 mg | ORAL_TABLET | Freq: Two times a day (BID) | ORAL | Status: DC | PRN

## 2012-07-30 NOTE — Telephone Encounter (Signed)
Alprazolam called to [facility]. 

## 2012-07-30 NOTE — Telephone Encounter (Signed)
Faxed refill request from [facility] last filled 06/27/12.

## 2012-08-05 ENCOUNTER — Telehealth: Payer: Self-pay | Admitting: Family Medicine

## 2012-08-05 NOTE — Telephone Encounter (Signed)
Patient would like to change her (MEDS) Premarin to a Bioidentical Hormone.  Premarin is no longer available at [facility].

## 2012-08-06 NOTE — Telephone Encounter (Signed)
Noted! Thank you

## 2012-08-06 NOTE — Telephone Encounter (Signed)
Which preparation does [facility] carry?

## 2012-08-06 NOTE — Telephone Encounter (Signed)
Spoke with April at [facility], premarin is still available, she is confused as to why pt said that.  After talking with the patient I think she means that she can no longer get the premarin at [facility], needs [facility].  I suggested she call her insurance company to verify this.  Also, she thinks she may want to change to a more body friendly HRT.  I told her to check with her insurance about this also, to see what else they may cover.  Pt agreed.

## 2012-08-30 ENCOUNTER — Other Ambulatory Visit: Payer: Self-pay | Admitting: *Deleted

## 2012-08-30 MED ORDER — TRAMADOL HCL 50 MG PO TABS: 50.0000 mg | ORAL_TABLET | Freq: Two times a day (BID) | ORAL | Status: DC | PRN

## 2012-08-30 MED ORDER — ALPRAZOLAM 0.25 MG PO TABS: 0.2500 mg | ORAL_TABLET | Freq: Every evening | ORAL | Status: DC | PRN

## 2012-08-30 NOTE — Telephone Encounter (Signed)
Medicines called to pharmacy. 

## 2012-08-30 NOTE — Telephone Encounter (Signed)
Phoned refill request from [facility], both last filled 07/30/12.

## 2012-09-26 ENCOUNTER — Encounter: Payer: Self-pay | Admitting: Family Medicine

## 2012-09-26 ENCOUNTER — Ambulatory Visit (INDEPENDENT_AMBULATORY_CARE_PROVIDER_SITE_OTHER): Payer: BC Managed Care – PPO | Admitting: Family Medicine

## 2012-09-26 VITALS — BP 114/80 | HR 90 | Temp 98.4°F | Wt 190.0 lb

## 2012-09-26 DIAGNOSIS — G44209 Tension-type headache, unspecified, not intractable: Secondary | ICD-10-CM

## 2012-09-26 DIAGNOSIS — K1120 Sialoadenitis, unspecified: Secondary | ICD-10-CM

## 2012-09-26 MED ORDER — AMOXICILLIN-POT CLAVULANATE 875-125 MG PO TABS: 1.0000 | ORAL_TABLET | Freq: Two times a day (BID) | ORAL | Status: DC

## 2012-09-26 MED ORDER — BUTALBITAL-APAP-CAFFEINE 50-325-40 MG PO TABS: 1.0000 | ORAL_TABLET | Freq: Two times a day (BID) | ORAL | Status: DC | PRN

## 2012-09-26 NOTE — Patient Instructions (Addendum)
Warm compresses to side of jaw 3 times a day through the weekend.  If pain not improving by Monday, then please call dentist to have them evaluate you and make sure there is not a dental abscess

## 2012-09-26 NOTE — Progress Notes (Signed)
Nature conservation officer at [facility] [address] [place] [address] Phone: [phone] Fax: [phone]  Date:  09/26/2012   Name:  Margaret Wood   DOB:  [birth year]   MRN:  [id number] Gender: female Age: 51 y.o.  PCP:  Ruthe Mannan, MD  Evaluating MD: Hannah Beat, MD   Chief Complaint: Jaw Pain and Migraine   History of Present Illness:  Margaret Wood is a 51 y.o. pleasant patient who presents with the following:  Having pain on the left side of her face there's been ongoing for the last 3 days. -- has had trigeminal neuralgia in the past, but this pain feels quite different in that previous..  She is also having a significant amount of pain when she is biting down with her back molars. All on the upper LEFT side. There is no swelling on the inside of the mouth or around the lips or in the mandibular region. She also has some mild pain slightly posterior to the year and anterior to the ear and ear or canal.   No fever, chills --- hurts with biting down on food.   She denies sinus pain.  She actually went to the dentist several weeks ago, and had a full evaluation and full set of x-rays which were normal reportedly. At that time she was completely asymptomatic.  Patient Active Problem List  Diagnosis  . UNSPECIFIED ARTHROPATHY MULTIPLE SITES  . SCOLIOSIS, LUMBAR SPINE  . PALPITATIONS  . LUMBAR SPRAIN AND STRAIN  . URI  . Fibromyalgia  . Scoliosis  . Mouth sore  . Tachycardia  . Symptoms, such as flushing, sleeplessness, headache, lack of concentration, associated with the menopause    Past Medical History  Diagnosis Date  . Palpitations   . Fibromyalgia   . Scoliosis   . Arthritis     Past Surgical History  Procedure Date  . Appendectomy 1980  . Cesarean section 1995  . Cholecystectomy 1997    History  Substance Use Topics  . Smoking status: Never Smoker   . Smokeless tobacco: Not on file  . Alcohol Use: No    No family history on  file.  Allergies  Allergen Reactions  . Nsaids     REACTION: abd pain  . Tegretol (Carbamazepine)     Made her ill    Medication list has been reviewed and updated.  Outpatient Prescriptions Prior to Visit  Medication Sig Dispense Refill  . acetaminophen (TYLENOL) 500 MG tablet Take 500 mg by mouth every 6 (six) hours as needed.        . ALPRAZolam (XANAX) 0.25 MG tablet Take 1 tablet (0.25 mg total) by mouth at bedtime as needed.  30 tablet  0  . cyclobenzaprine (FLEXERIL) 10 MG tablet Take 0.5-1 tablets (5-10 mg total) by mouth 3 (three) times daily as needed.  30 tablet  0  . dicyclomine (BENTYL) 10 MG capsule 1-2 tablets four times daily  240 capsule  5  . estrogens, conjugated, (PREMARIN) 0.625 MG tablet Take 1 tablet by mouth daily.  30 tablet  11  . metoprolol tartrate (LOPRESSOR) 25 MG tablet Take 1 tablet (25 mg total) by mouth 2 (two) times daily as needed.  60 tablet  11  . progesterone (PROMETRIUM) 100 MG capsule 1 tablet by mouth nightly  30 capsule  11  . traMADol (ULTRAM) 50 MG tablet Take 1 tablet (50 mg total) by mouth 2 (two) times daily as needed.  60 tablet  0  . pantoprazole (PROTONIX) 40 MG tablet Take 1 tablet (40 mg total) by mouth daily.  30 tablet  5    Review of Systems:  Headache. Posterior. No photophobia. No photophobia. No aura or other visual disturbance. Mouth pain as described above. No chest pain or shortness of breath.  Physical Examination: Filed Vitals:   09/26/12 0824  BP: 114/80  Pulse: 90  Temp: 98.4 F (36.9 C)  TempSrc: Oral  Weight: 190 lb (86.183 kg)  SpO2: 97%    There is no height on file to calculate BMI. Ideal Body Weight:     GEN: WDWN, NAD, Non-toxic, Alert & Oriented x 3 HEENT: Atraumatic, Normocephalic. Tympanic membranes are clear bilaterally with easily visualized landmarks and normal appearance without redness or dullness. Nontender with movement of the entire year and nontender at the tragus. Minimally tender  posterior, consistent with posterior node. Notably tender at the angle of the jaw, just posterior to this at the parotid gland of the LEFT. Nontender the TMJ. Extraocular movements are intact.  Mouth: The entire LEFT side of the mouth was explored, and no pain was identified and the swelling was identified or warmth in the gumline. Ears and Nose: No external deformity. EXTR: No clubbing/cyanosis/edema NEURO: Normal gait.  PSYCH: Normally interactive. Conversant. Not depressed or anxious appearing.  Calm demeanor.    Assessment and Plan:  1. Parotiditis   2. Tension headache    The history and exam, most consistent with stone in the parotid gland versus potential infection of the parotid gland the LEFT. We'll treat with appropriate antibiotic coverage, Augmentin. Also recommended routine warm compresses.  The differential would also include dental abscess, and I recommended that if symptoms persist that she have a full dental evaluation next week to exclude this process or different mandibular or tooth involvement.   The patient did not agree with this advice, so my best recommendation would be if her symptoms persist that she follow up with her primary care provider, Dr. Dayton Martes.  Headaches are not consistent with migraine. The patient has no history of migraine. She has no symptoms that are classically associated with migraine and are most consistent with posterior tension headaches. Likely exacerbated by ongoing jaw pain. Tylenol, tramadol, or esgic prn.  Orders Today:  No orders of the defined types were placed in this encounter.    Updated Medication List: (Includes new medications, updates to list, dose adjustments) Meds ordered this encounter  Medications  . pantoprazole (PROTONIX) 40 MG tablet    Sig: Take 40 mg by mouth as needed.  Marland Kitchen amoxicillin-clavulanate (AUGMENTIN) 875-125 MG per tablet    Sig: Take 1 tablet by mouth 2 (two) times daily.    Dispense:  20 tablet    Refill:   0  . butalbital-acetaminophen-caffeine (ESGIC) 50-325-40 MG per tablet    Sig: Take 1 tablet by mouth 2 (two) times daily as needed for headache.    Dispense:  30 tablet    Refill:  0    Medications Discontinued: Medications Discontinued During This Encounter  Medication Reason  . pantoprazole (PROTONIX) 40 MG tablet      Hannah Beat, MD

## 2012-10-02 ENCOUNTER — Encounter: Payer: Self-pay | Admitting: Family Medicine

## 2012-10-02 ENCOUNTER — Ambulatory Visit (INDEPENDENT_AMBULATORY_CARE_PROVIDER_SITE_OTHER): Payer: BC Managed Care – PPO | Admitting: Family Medicine

## 2012-10-02 ENCOUNTER — Other Ambulatory Visit: Payer: Self-pay | Admitting: *Deleted

## 2012-10-02 VITALS — BP 122/88 | HR 88 | Temp 98.2°F | Wt 192.0 lb

## 2012-10-02 DIAGNOSIS — M129 Arthropathy, unspecified: Secondary | ICD-10-CM

## 2012-10-02 DIAGNOSIS — M797 Fibromyalgia: Secondary | ICD-10-CM

## 2012-10-02 DIAGNOSIS — R6884 Jaw pain: Secondary | ICD-10-CM

## 2012-10-02 MED ORDER — CYCLOBENZAPRINE HCL 10 MG PO TABS: ORAL_TABLET | ORAL | Status: DC

## 2012-10-02 MED ORDER — TRAMADOL HCL 50 MG PO TABS: 50.0000 mg | ORAL_TABLET | Freq: Two times a day (BID) | ORAL | Status: DC | PRN

## 2012-10-02 MED ORDER — ALPRAZOLAM 0.25 MG PO TABS: 0.2500 mg | ORAL_TABLET | Freq: Every evening | ORAL | Status: DC | PRN

## 2012-10-02 NOTE — Telephone Encounter (Signed)
Faxed refill requests from [facility], both last filled 08/30/12.

## 2012-10-02 NOTE — Patient Instructions (Addendum)
Temporomandibular Joint Pain Your exam shows that you have a problem with your temporomandibular joint (TMJ), the joint that moves when you open your mouth or chew food. TMJ problems can result from direct injuries, bite abnormalities, or tension states which cause you to grind or clench your teeth. Typical symptoms include pain around the joint, clicking, restricted movement, and headaches. The TMJ is like any other joint in the body; when it is strained, it needs rest to repair itself. To keep the joint at rest it is important that you do not open your mouth wider than the width of your index finger. If you must yawn, be sure to support your chin with your hand so your mouth does not open wide. Eat a soft diet (nothing firmer than ground beef, no raw vegetables), do not chew gum and do not talk if it causes you pain.  Apply topical heat by using a warm, moist cloth placed in front of the ear for 15 to 20 minutes several times daily. Alternating heat and ice may give even more relief. Anti-inflammatory pain medicine and muscle relaxants can also be helpful. A dental orthotic or splint may be used for temporary relief. Long-term problems may require treatment for stress as well as braces or surgery. Please check with your doctor or dentist if your symptoms do not improve within one week. Document Released: 12/21/2004 Document Revised: 02/05/2012 Document Reviewed: 11/13/2005 [facility] Patient Information 2013 [place], [place].   Take Flexeril 10 mg nightly- call me before the end of the week with an update.

## 2012-10-02 NOTE — Progress Notes (Signed)
Nature conservation officer at [facility] [address] [place] [address] Phone: [phone] Fax: [phone]  Date:  10/02/2012   Name:  Margaret Wood   DOB:  12-16-1960   MRN:  [id number] Gender: female Age: 51 y.o.   History of Present Illness:  Margaret Wood is a 51 y.o. pleasant patient who presents with the following:  Having pain on the left side of her face there's been ongoing for for 2 weeks.  Started immediately after she had extensive xrays done at the two separate dentists offices- per pt, no abscesses on xray.   Saw Dr. Patsy Lager on 10/31 for this complaint- note reviewed.   He felt this was most consistent with stone in the parotid gland versus potential infection of the parotid gland the LEFT and treated her appropriately with Augmentin.  Also recommended warm compresses. She did not take the Augmentin because she felt this was not an infection.  Has had trigeminal neuralgia in the past, but this pain feels quite different in that previous..    No fever, chills, significant pain with opening her mouth and chewing.  She denies sinus pain.  She also feels her jaw clicking and popping when she opens her mouth more than a few centimeters.  Patient Active Problem List  Diagnosis  . UNSPECIFIED ARTHROPATHY MULTIPLE SITES  . SCOLIOSIS, LUMBAR SPINE  . PALPITATIONS  . LUMBAR SPRAIN AND STRAIN  . URI  . Fibromyalgia  . Scoliosis  . Mouth sore  . Tachycardia  . Symptoms, such as flushing, sleeplessness, headache, lack of concentration, associated with the menopause    Past Medical History  Diagnosis Date  . Palpitations   . Fibromyalgia   . Scoliosis   . Arthritis     Past Surgical History  Procedure Date  . Appendectomy 1980  . Cesarean section 1995  . Cholecystectomy 1997    History  Substance Use Topics  . Smoking status: Never Smoker   . Smokeless tobacco: Not on file  . Alcohol Use: No    No family history on file.  Allergies    Allergen Reactions  . Nsaids     REACTION: abd pain  . Tegretol (Carbamazepine)     Made her ill    Medication list has been reviewed and updated.  Outpatient Prescriptions Prior to Visit  Medication Sig Dispense Refill  . acetaminophen (TYLENOL) 500 MG tablet Take 500 mg by mouth every 6 (six) hours as needed.        . ALPRAZolam (XANAX) 0.25 MG tablet Take 1 tablet (0.25 mg total) by mouth at bedtime as needed.  30 tablet  0  . butalbital-acetaminophen-caffeine (ESGIC) 50-325-40 MG per tablet Take 1 tablet by mouth 2 (two) times daily as needed for headache.  30 tablet  0  . cyclobenzaprine (FLEXERIL) 10 MG tablet Take 0.5-1 tablets (5-10 mg total) by mouth 3 (three) times daily as needed.  30 tablet  0  . dicyclomine (BENTYL) 10 MG capsule 1-2 tablets four times daily  240 capsule  5  . estrogens, conjugated, (PREMARIN) 0.625 MG tablet Take 1 tablet by mouth daily.  30 tablet  11  . pantoprazole (PROTONIX) 40 MG tablet Take 40 mg by mouth as needed.      . progesterone (PROMETRIUM) 100 MG capsule 1 tablet by mouth nightly  30 capsule  11  . traMADol (ULTRAM) 50 MG tablet Take 1 tablet (50 mg total) by mouth 2 (two) times daily as needed.  60 tablet  0  . metoprolol tartrate (LOPRESSOR) 25 MG tablet Take 1 tablet (25 mg total) by mouth 2 (two) times daily as needed.  60 tablet  11  . amoxicillin-clavulanate (AUGMENTIN) 875-125 MG per tablet Take 1 tablet by mouth 2 (two) times daily.  20 tablet  0    Review of Systems:  Headache. Posterior. No photophobia. No photophobia. No aura or other visual disturbance. Mouth pain as described above. No chest pain or shortness of breath.  Physical Examination: BP 122/88  Pulse 88  Temp 98.2 F (36.8 C)  Wt 192 lb (87.091 kg)  LMP 09/08/2012   GEN: WDWN, NAD, Non-toxic, Alert & Oriented x 3 HEENT: Atraumatic, Normocephalic. Tympanic membranes are clear bilaterally with easily visualized landmarks and normal appearance without redness or  dullness.  Left jaw:  Pain with opening mouth, audible TMJ noises with function.   EXTR: No clubbing/cyanosis/edema NEURO: Normal gait.  PSYCH: Normally interactive. Conversant. Not depressed or anxious appearing.  Calm demeanor.    Assessment and Plan: 1.  Jaw pain- Seems most consistent with TMJ likely triggered from having her mouth open for prolonged periods of time for dental work.  She does not think she grinds her teeth but does chew gum. Start with flexeril 10 mg qhs, warm compresses, avoid hard foods and gum.  If no improvement in next several days, refer to ENT and for possible PT. The patient indicates understanding of these issues and agrees with the plan.   1.  Jaw Pain-

## 2012-10-03 NOTE — Telephone Encounter (Signed)
Medicines called to pharmacy. 

## 2012-10-30 ENCOUNTER — Other Ambulatory Visit: Payer: Self-pay | Admitting: *Deleted

## 2012-10-30 MED ORDER — ALPRAZOLAM 0.25 MG PO TABS: 0.2500 mg | ORAL_TABLET | Freq: Every evening | ORAL | Status: DC | PRN

## 2012-10-30 MED ORDER — TRAMADOL HCL 50 MG PO TABS
50.0000 mg | ORAL_TABLET | Freq: Two times a day (BID) | ORAL | Status: DC | PRN
Start: 2012-10-30 — End: 2012-12-03

## 2012-10-30 NOTE — Telephone Encounter (Signed)
rx called in

## 2012-12-03 ENCOUNTER — Other Ambulatory Visit: Payer: Self-pay | Admitting: *Deleted

## 2012-12-03 MED ORDER — ALPRAZOLAM 0.25 MG PO TABS: 0.2500 mg | ORAL_TABLET | Freq: Every evening | ORAL | Status: DC | PRN

## 2012-12-03 MED ORDER — TRAMADOL HCL 50 MG PO TABS: 50.0000 mg | ORAL_TABLET | Freq: Two times a day (BID) | ORAL | Status: DC | PRN

## 2012-12-03 NOTE — Telephone Encounter (Signed)
Faxed requests from [facility], last filled 10/30/12.

## 2012-12-03 NOTE — Telephone Encounter (Signed)
Medicine called to [facility]. 

## 2013-01-02 ENCOUNTER — Other Ambulatory Visit: Payer: Self-pay | Admitting: *Deleted

## 2013-01-02 MED ORDER — ALPRAZOLAM 0.25 MG PO TABS: 0.2500 mg | ORAL_TABLET | Freq: Every evening | ORAL | Status: DC | PRN

## 2013-01-02 MED ORDER — TRAMADOL HCL 50 MG PO TABS: 50.0000 mg | ORAL_TABLET | Freq: Two times a day (BID) | ORAL | Status: DC | PRN

## 2013-01-06 NOTE — Telephone Encounter (Signed)
Medicine called to [facility]. 

## 2013-01-14 ENCOUNTER — Other Ambulatory Visit: Payer: Self-pay | Admitting: Family Medicine

## 2013-01-14 MED ORDER — ALPRAZOLAM 0.25 MG PO TABS: 0.2500 mg | ORAL_TABLET | Freq: Every evening | ORAL | Status: DC | PRN

## 2013-01-14 NOTE — Telephone Encounter (Signed)
Faxed refill request from [facility].  Last filled 01/02/2013.

## 2013-01-14 NOTE — Telephone Encounter (Signed)
Phoned in to [facility]. 

## 2013-01-17 ENCOUNTER — Ambulatory Visit: Payer: BC Managed Care – PPO | Admitting: Family Medicine

## 2013-01-22 ENCOUNTER — Encounter: Payer: Self-pay | Admitting: Family Medicine

## 2013-01-22 ENCOUNTER — Ambulatory Visit (INDEPENDENT_AMBULATORY_CARE_PROVIDER_SITE_OTHER): Payer: BC Managed Care – PPO | Admitting: Family Medicine

## 2013-01-22 VITALS — BP 112/90 | HR 64 | Temp 97.8°F | Wt 193.0 lb

## 2013-01-22 DIAGNOSIS — K644 Residual hemorrhoidal skin tags: Secondary | ICD-10-CM | POA: Insufficient documentation

## 2013-01-22 MED ORDER — HYDROCORTISONE ACETATE 25 MG RE SUPP: 25.0000 mg | Freq: Two times a day (BID) | RECTAL | Status: DC

## 2013-01-22 NOTE — Progress Notes (Signed)
Subjective:    Patient ID: Margaret Wood, female    DOB: 1961/01/23, 52 y.o.   MRN: [id number]  HPI  52 yo with h/o IBS with 1 month of hemorrhoid irritation- itching, some bright red blood. Not very painful.  Has had intermittent hemorrhoids for years.  No abdominal pain.  No nausea or vomiting.  Appetite ok.   Patient Active Problem List  Diagnosis  . UNSPECIFIED ARTHROPATHY MULTIPLE SITES  . SCOLIOSIS, LUMBAR SPINE  . Fibromyalgia  . Scoliosis  . Symptoms, such as flushing, sleeplessness, headache, lack of concentration, associated with the menopause  . External hemorrhoid, bleeding   Past Medical History  Diagnosis Date  . Palpitations   . Fibromyalgia   . Scoliosis   . Arthritis    Past Surgical History  Procedure Laterality Date  . Appendectomy  1980  . Cesarean section  1995  . Cholecystectomy  1997   History  Substance Use Topics  . Smoking status: Never Smoker   . Smokeless tobacco: Not on file  . Alcohol Use: No   No family history on file. Allergies  Allergen Reactions  . Nsaids     REACTION: abd pain  . Tegretol (Carbamazepine)     Made her ill   Current Outpatient Prescriptions on File Prior to Visit  Medication Sig Dispense Refill  . acetaminophen (TYLENOL) 500 MG tablet Take 500 mg by mouth every 6 (six) hours as needed.        . ALPRAZolam (XANAX) 0.25 MG tablet Take 1 tablet (0.25 mg total) by mouth at bedtime as needed.  30 tablet  0  . butalbital-acetaminophen-caffeine (ESGIC) 50-325-40 MG per tablet Take 1 tablet by mouth 2 (two) times daily as needed for headache.  30 tablet  0  . cyclobenzaprine (FLEXERIL) 10 MG tablet 1 tablet by mouth at bedtime.  30 tablet  0  . dicyclomine (BENTYL) 10 MG capsule 1-2 tablets four times daily  240 capsule  5  . estrogens, conjugated, (PREMARIN) 0.625 MG tablet Take 1 tablet by mouth daily.  30 tablet  11  . metoprolol tartrate (LOPRESSOR) 25 MG tablet Take 25 mg by mouth 2 (two) times daily as  needed.      . pantoprazole (PROTONIX) 40 MG tablet Take 40 mg by mouth as needed.      . progesterone (PROMETRIUM) 100 MG capsule 1 tablet by mouth nightly  30 capsule  11  . traMADol (ULTRAM) 50 MG tablet Take 1 tablet (50 mg total) by mouth 2 (two) times daily as needed.  60 tablet  0   No current facility-administered medications on file prior to visit.   The PMH, PSH, Social History, Family History, Medications, and allergies have been reviewed in [facility], and have been updated if relevant.  Review of Systems See HPI    Objective:   Physical Exam BP 112/90  Pulse 64  Temp(Src) 97.8 F (36.6 C)  Wt 193 lb (87.544 kg)  BMI 33.11 kg/m2  General:  Well-developed,well-nourished,in no acute distress; alert,appropriate and cooperative throughout examination Head:  normocephalic and atraumatic.   Eyes:  vision grossly intact, pupils equal, pupils round, and pupils reactive to light.   Ears:  R ear normal and L ear normal.   Nose:  no external deformity.   Mouth:  good dentition.   Lungs:  Normal respiratory effort, chest expands symmetrically. Lungs are clear to auscultation, no crackles or wheezes. Heart:  Normal rate and regular rhythm. S1 and S2 normal without gallop,  murmur, click, rub or other extra sounds. Abdomen:  Bowel sounds positive,abdomen soft and non-tender without masses, organomegaly or hernias noted. Rectal:  + external hemorrhoid, non thrombosed Msk:  No deformity or scoliosis noted of thoracic or lumbar spine.   Extremities:  No clubbing, cyanosis, edema, or deformity noted with normal full range of motion of all joints.   Psych:  Cognition and judgment appear intact. Alert and cooperative with normal attention span and concentration. No apparent delusions, illusions, hallucinations     Assessment & Plan:  1. External hemorrhoid, bleeding Non thrombosed Supportive care outlined in AVS. Anusol-HC suppositories twice daily until symptoms improve.

## 2013-01-22 NOTE — Patient Instructions (Signed)

## 2013-02-05 ENCOUNTER — Telehealth: Payer: Self-pay

## 2013-02-05 NOTE — Telephone Encounter (Signed)
Pt said Anusol HC suppository is causing terrible gas problems. Pt feels bloated when uses suppository and request changed to Clobetasol. [facility]; pt said hemorrhoids are somewhat better.Please advise.

## 2013-02-05 NOTE — Telephone Encounter (Signed)
Advised patient.  She says she has used the hydrocortisone and it didn't help.  She will wait and see how she does.  Will call back if problem continues.

## 2013-02-05 NOTE — Telephone Encounter (Signed)
Advised patient.  She says she has tried Prep H and it didn't help, didn't do anything.  Please advise.

## 2013-02-05 NOTE — Telephone Encounter (Signed)
I prefer she not use clobetasol in the rectum as it is a stronger steroid.  I would stop using the anusol and use either OTC prep H or other preparation OTC>

## 2013-02-05 NOTE — Telephone Encounter (Signed)
We don't want you to use steroids for more than two weeks and I cannot use something stronger than hydrocortisone.  If she does not want to use the suppositories, I would recommend OTC hydrocortisone 1% twice daily to area and use with desitin or other zinc barrier creams to help with irritation.

## 2013-02-10 ENCOUNTER — Other Ambulatory Visit: Payer: Self-pay | Admitting: *Deleted

## 2013-02-10 MED ORDER — ALPRAZOLAM 0.25 MG PO TABS: 0.2500 mg | ORAL_TABLET | Freq: Every evening | ORAL | Status: DC | PRN

## 2013-02-10 MED ORDER — TRAMADOL HCL 50 MG PO TABS: 50.0000 mg | ORAL_TABLET | Freq: Two times a day (BID) | ORAL | Status: DC | PRN

## 2013-02-10 NOTE — Telephone Encounter (Signed)
Xanax called to [facility]. 

## 2013-02-10 NOTE — Telephone Encounter (Signed)
Faxed refill requests from [facility], xanax last filled on 01/14/13, tramadol last filled on 01/06/13.

## 2013-02-17 ENCOUNTER — Other Ambulatory Visit: Payer: Self-pay | Admitting: *Deleted

## 2013-02-17 MED ORDER — HYDROCORTISONE ACETATE 25 MG RE SUPP: 25.0000 mg | Freq: Two times a day (BID) | RECTAL | Status: DC

## 2013-02-17 NOTE — Telephone Encounter (Signed)
Faxed refill request from [facility], last filled 01/22/13.

## 2013-03-19 ENCOUNTER — Other Ambulatory Visit: Payer: Self-pay | Admitting: *Deleted

## 2013-03-19 MED ORDER — ALPRAZOLAM 0.25 MG PO TABS: 0.2500 mg | ORAL_TABLET | Freq: Every evening | ORAL | Status: DC | PRN

## 2013-03-19 MED ORDER — TRAMADOL HCL 50 MG PO TABS: 50.0000 mg | ORAL_TABLET | Freq: Two times a day (BID) | ORAL | Status: DC | PRN

## 2013-03-19 NOTE — Telephone Encounter (Signed)
Last filled 02/10/13 

## 2013-03-19 NOTE — Telephone Encounter (Signed)
Alprazolam called to [facility].

## 2013-04-22 ENCOUNTER — Other Ambulatory Visit: Payer: Self-pay | Admitting: Family Medicine

## 2013-04-22 NOTE — Telephone Encounter (Signed)
Xanax called to [facility]. 

## 2013-06-05 ENCOUNTER — Other Ambulatory Visit: Payer: Self-pay | Admitting: Family Medicine

## 2013-06-11 ENCOUNTER — Other Ambulatory Visit: Payer: Self-pay | Admitting: Family Medicine

## 2013-06-12 NOTE — Telephone Encounter (Signed)
Refill called to [facility]. 

## 2013-07-11 ENCOUNTER — Other Ambulatory Visit: Payer: Self-pay | Admitting: Family Medicine

## 2013-07-11 MED ORDER — ALPRAZOLAM 0.25 MG PO TABS: ORAL_TABLET | ORAL | Status: DC

## 2013-07-11 MED ORDER — TRAMADOL HCL 50 MG PO TABS: ORAL_TABLET | ORAL | Status: DC

## 2013-07-11 NOTE — Telephone Encounter (Addendum)
Xanax Last filled 7/16 Tramadol last filled 7/10

## 2013-07-14 ENCOUNTER — Other Ambulatory Visit: Payer: Self-pay | Admitting: Family Medicine

## 2013-07-14 NOTE — Telephone Encounter (Signed)
Refills called to [facility].

## 2013-07-15 ENCOUNTER — Other Ambulatory Visit: Payer: Self-pay | Admitting: *Deleted

## 2013-07-15 MED ORDER — PROGESTERONE MICRONIZED 100 MG PO CAPS: ORAL_CAPSULE | ORAL | Status: DC

## 2013-07-21 ENCOUNTER — Other Ambulatory Visit: Payer: Self-pay | Admitting: Family Medicine

## 2013-07-21 MED ORDER — PROGESTERONE MICRONIZED 100 MG PO CAPS: ORAL_CAPSULE | ORAL | Status: DC

## 2013-07-21 NOTE — Telephone Encounter (Signed)
Pt requesting RX be called in to [facility] instead of [facility].

## 2013-07-22 ENCOUNTER — Emergency Department (HOSPITAL_COMMUNITY)
Admission: EM | Admit: 2013-07-22 | Discharge: 2013-07-23 | Disposition: A | Discharge status: Home / Self Care | Payer: BC Managed Care – PPO | Attending: Emergency Medicine | Admitting: Emergency Medicine

## 2013-07-22 ENCOUNTER — Encounter (HOSPITAL_COMMUNITY): Payer: Self-pay

## 2013-07-22 DIAGNOSIS — T450X4A Poisoning by antiallergic and antiemetic drugs, undetermined, initial encounter: Secondary | ICD-10-CM | POA: Insufficient documentation

## 2013-07-22 DIAGNOSIS — T43502A Poisoning by unspecified antipsychotics and neuroleptics, intentional self-harm, initial encounter: Secondary | ICD-10-CM | POA: Insufficient documentation

## 2013-07-22 DIAGNOSIS — T50992A Poisoning by other drugs, medicaments and biological substances, intentional self-harm, initial encounter: Secondary | ICD-10-CM | POA: Insufficient documentation

## 2013-07-22 DIAGNOSIS — Z8739 Personal history of other diseases of the musculoskeletal system and connective tissue: Secondary | ICD-10-CM | POA: Insufficient documentation

## 2013-07-22 DIAGNOSIS — R45851 Suicidal ideations: Secondary | ICD-10-CM

## 2013-07-22 DIAGNOSIS — T424X4A Poisoning by benzodiazepines, undetermined, initial encounter: Secondary | ICD-10-CM | POA: Insufficient documentation

## 2013-07-22 DIAGNOSIS — T1491XA Suicide attempt, initial encounter: Secondary | ICD-10-CM

## 2013-07-22 DIAGNOSIS — T398X1A Poisoning by other nonopioid analgesics and antipyretics, not elsewhere classified, accidental (unintentional), initial encounter: Secondary | ICD-10-CM | POA: Insufficient documentation

## 2013-07-22 DIAGNOSIS — F329 Major depressive disorder, single episode, unspecified: Secondary | ICD-10-CM | POA: Insufficient documentation

## 2013-07-22 DIAGNOSIS — Z79899 Other long term (current) drug therapy: Secondary | ICD-10-CM | POA: Insufficient documentation

## 2013-07-22 DIAGNOSIS — F3289 Other specified depressive episodes: Secondary | ICD-10-CM | POA: Insufficient documentation

## 2013-07-22 DIAGNOSIS — R404 Transient alteration of awareness: Secondary | ICD-10-CM | POA: Insufficient documentation

## 2013-07-22 DIAGNOSIS — Z3202 Encounter for pregnancy test, result negative: Secondary | ICD-10-CM | POA: Insufficient documentation

## 2013-07-22 DIAGNOSIS — T394X2A Poisoning by antirheumatics, not elsewhere classified, intentional self-harm, initial encounter: Secondary | ICD-10-CM | POA: Insufficient documentation

## 2013-07-22 HISTORY — DX: Major depressive disorder, single episode, unspecified: F32.9

## 2013-07-22 HISTORY — DX: Depression, unspecified: F32.A

## 2013-07-22 LAB — COMPREHENSIVE METABOLIC PANEL
ALT: 9 U/L (ref 0–35)
Albumin: 4.4 g/dL (ref 3.5–5.2)
Alkaline Phosphatase: 58 U/L (ref 39–117)
Potassium: 3.7 mEq/L (ref 3.5–5.1)
Sodium: 137 mEq/L (ref 135–145)
Total Protein: 8.4 g/dL — ABNORMAL HIGH (ref 6.0–8.3)

## 2013-07-22 LAB — GLUCOSE, CAPILLARY

## 2013-07-22 LAB — CBC
MCHC: 36.1 g/dL — ABNORMAL HIGH (ref 30.0–36.0)
Platelets: 262 10*3/uL (ref 150–400)
RDW: 12.7 % (ref 11.5–15.5)

## 2013-07-22 LAB — RAPID URINE DRUG SCREEN, HOSP PERFORMED
Amphetamines: NOT DETECTED
Cocaine: NOT DETECTED
Opiates: NOT DETECTED
Tetrahydrocannabinol: NOT DETECTED

## 2013-07-22 MED ORDER — SODIUM CHLORIDE 0.9 % IV SOLN
1000.0000 mL | INTRAVENOUS | Status: DC
  Administered 2013-07-22: 1000 mL via INTRAVENOUS

## 2013-07-22 MED ORDER — SODIUM CHLORIDE 0.9 % IV SOLN
1000.0000 mL | Freq: Once | INTRAVENOUS | Status: AC
  Administered 2013-07-22: 1000 mL via INTRAVENOUS

## 2013-07-22 NOTE — ED Notes (Signed)
PER EMS: pt overdosed, approx 20 0.25mg  xanax, 4 tramadol, 4 benadryl. Pt left a note and went into woods, ems took 20-30 mins to find her. Pt very lethargic but alert and oriented. Hx of depression. VS- BP-130/70, 100HR, NSR, 98% RA.

## 2013-07-22 NOTE — ED Provider Notes (Signed)
CSN: [id number]     Arrival date & time 07/22/13  2102 History   First MD Initiated Contact with Patient 07/22/13 2109     Chief Complaint  Patient presents with  . Drug Overdose   (Consider location/radiation/quality/duration/timing/severity/associated sxs/prior Treatment) Patient is a 52 y.o. female presenting with Overdose. The history is provided by the patient and medical records. No language interpreter was used.  Drug Overdose Pertinent negatives include no abdominal pain, chest pain, coughing, diaphoresis, fatigue, fever, headaches, nausea, rash or vomiting.    Margaret Wood is a 52 y.o. female  with a hx of depression, fibromyalgia, scoliosis, arthritis, depression presents to the Emergency Department complaining of acute overdose of Xanax, tramadol and Benadryl.   He states she took approximately 20 Xanax (0.25mg ) but she did not count and she is unsure of how many she had in her hand. She took 2 tramadol and 4 Benadryl. Associated symptoms include drowsiness.  Patient left a suicide note for her family are fine and wanted into the [place]. He took the family more than 30 minutes to find her. Patient's ingestion occurred greater than 2 hours ago.  Patient reports that she was suicidal and that was her intent to kill herself tonight. She states that Margaret Wood stresses drove her to this and she has never sought any counseling.  She denies auditory or visual hallucinations.    Past Medical History  Diagnosis Date  . Palpitations   . Fibromyalgia   . Scoliosis   . Arthritis   . Depression    Past Surgical History  Procedure Laterality Date  . Appendectomy  1980  . Cesarean section  1995  . Cholecystectomy  1997   No family history on file. History  Substance Use Topics  . Smoking status: Never Smoker   . Smokeless tobacco: Not on file  . Alcohol Use: No   OB History   Grav Para Term Preterm Abortions TAB SAB Ect Mult Living                 Review of Systems   Constitutional: Negative for fever, diaphoresis, appetite change, fatigue and unexpected weight change.  HENT: Negative for mouth sores and neck stiffness.   Eyes: Negative for visual disturbance.  Respiratory: Negative for cough, chest tightness, shortness of breath and wheezing.   Cardiovascular: Negative for chest pain.  Gastrointestinal: Negative for nausea, vomiting, abdominal pain, diarrhea and constipation.  Endocrine: Negative for polydipsia, polyphagia and polyuria.  Genitourinary: Negative for dysuria, urgency, frequency and hematuria.  Musculoskeletal: Negative for back pain.  Skin: Negative for rash.  Allergic/Immunologic: Negative for immunocompromised state.  Neurological: Negative for syncope, light-headedness and headaches.  Hematological: Does not bruise/bleed easily.  Psychiatric/Behavioral: Positive for suicidal ideas, self-injury and dysphoric mood. Negative for sleep disturbance. The patient is not nervous/anxious.     Allergies  Amoxicillin; Nsaids; and Tegretol  Home Medications   Current Outpatient Rx  Name  Route  Sig  Dispense  Refill  . acetaminophen (TYLENOL) 500 MG tablet   Oral   Take 500 mg by mouth every 6 (six) hours as needed.           . ALPRAZolam (XANAX) 0.25 MG tablet   Oral   Take 0.25 mg by mouth at bedtime as needed for sleep.         . cyclobenzaprine (FLEXERIL) 10 MG tablet      1 tablet by mouth at bedtime.   30 tablet   0   .  dicyclomine (BENTYL) 10 MG capsule      1-2 tablets four times daily   240 capsule   5   . estrogens, conjugated, (PREMARIN) 0.625 MG tablet   Oral   Take 0.625 mg by mouth daily. Take daily for 21 days then do not take for 7 days.         . metoprolol tartrate (LOPRESSOR) 25 MG tablet   Oral   Take 25 mg by mouth 2 (two) times daily as needed (arrythmia).          . pantoprazole (PROTONIX) 40 MG tablet   Oral   Take 40 mg by mouth as needed.         . progesterone (PROMETRIUM) 100 MG  capsule      1 tablet by mouth nightly   30 capsule   5   . traMADol (ULTRAM) 50 MG tablet   Oral   Take 50 mg by mouth 2 (two) times daily as needed for pain.          BP 103/58  Pulse 89  Temp(Src) 98.3 F (36.8 C) (Oral)  Resp 16  SpO2 100%  LMP 06/17/2013 Physical Exam  Nursing note and vitals reviewed. Constitutional: She appears well-developed and well-nourished. No distress.  Awake, alert, nontoxic appearance  HENT:  Head: Normocephalic and atraumatic.  Mouth/Throat: Oropharynx is clear and moist. No oropharyngeal exudate.  Eyes: Conjunctivae are normal. No scleral icterus.  Neck: Normal range of motion. Neck supple.  Cardiovascular: Normal rate, regular rhythm, normal heart sounds and intact distal pulses.   No murmur heard. Pulmonary/Chest: Effort normal and breath sounds normal. No respiratory distress. She has no wheezes. She has no rales.  Abdominal: Soft. Bowel sounds are normal. She exhibits no distension. There is no tenderness. There is no rebound.  Musculoskeletal: Normal range of motion. She exhibits no edema.  Neurological: She is alert. She exhibits normal muscle tone. Coordination normal.  Speech is clear and goal oriented Moves extremities without ataxia  Skin: Skin is warm and dry. No rash noted. She is not diaphoretic. No erythema.  Psychiatric: She is slowed. She is not actively hallucinating. She exhibits a depressed mood. She expresses suicidal ideation. She expresses no homicidal ideation. She expresses suicidal plans. She expresses no homicidal plans.  Pt drowsy    ED Course  Procedures (including critical care time) Labs Review Labs Reviewed  CBC - Abnormal; Notable for the following:    Hemoglobin 15.9 (*)    MCHC 36.1 (*)    All other components within normal limits  COMPREHENSIVE METABOLIC PANEL - Abnormal; Notable for the following:    Total Protein 8.4 (*)    Total Bilirubin 1.3 (*)    GFR calc non Af Amer 73 (*)    GFR calc Af  Amer 85 (*)    All other components within normal limits  SALICYLATE LEVEL - Abnormal; Notable for the following:    Salicylate Lvl <2.0 (*)    All other components within normal limits  URINE RAPID DRUG SCREEN (HOSP PERFORMED) - Abnormal; Notable for the following:    Benzodiazepines POSITIVE (*)    All other components within normal limits  ETHANOL  ACETAMINOPHEN LEVEL  GLUCOSE, CAPILLARY  POCT PREGNANCY, URINE   Imaging Review No results found.  ECG:  Date: 07/23/2013  Rate: 99  Rhythm: normal sinus rhythm  QRS Axis: normal  Intervals: normal  ST/T Wave abnormalities: normal  Conduction Disutrbances:none  Narrative Interpretation: Nonischemic ECG, unchanged from 04/13/10  Old EKG Reviewed: unchanged    MDM   1. Suicidal ideation   2. Suicide attempt   3. Overdose, initial encounter      GIANINA OLINDE presents after intentional overdose.  Poison control recommended 6 hours of monitoring, seizure precautions, fluids, ECG monitoring and four-hour Tylenol level.  Patient develops any further symptoms they recommended overnight observation.  We'll begin fluids, gather labs and monitor.  12:06 AM Patient remains alert, no longer tachycardic and without seizures.  Will recheck Tylenol level in one hour. Consult to ACT for placement.  1:02 AM 4 hr tylenol level pending.  Discussed patient with Junious Silk, PA-C who will follow.  Pt can be moved to Pod C at 0300.  Pt remains arousable, without hypoxia or seizure.    Dahlia Client Callaway Hailes, PA-C 07/23/13 0103  Dahlia Client Evalisse Prajapati, PA-C 07/23/13 1610

## 2013-07-22 NOTE — ED Notes (Signed)
Seizure pads placed on pt's bed.

## 2013-07-22 NOTE — ED Notes (Signed)
This nurse spoke with Patty from Poison control and Patty told this nurse to initiate seizure precautions, fluids, cardiac monitoring, monitor pt for 6 hours but more if pt still symptomatic, and a 4 hour post ingestion tylenol level. PA Hannah at bedside during conversation and aware.

## 2013-07-23 ENCOUNTER — Encounter (HOSPITAL_COMMUNITY): Payer: Self-pay

## 2013-07-23 ENCOUNTER — Inpatient Hospital Stay (HOSPITAL_COMMUNITY)
Admission: AD | Admit: 2013-07-23 | Discharge: 2013-07-28 | DRG: 885 | Disposition: A | Discharge status: Home / Self Care | Payer: PRIVATE HEALTH INSURANCE | Source: Intra-hospital | Attending: Emergency Medicine | Admitting: Emergency Medicine

## 2013-07-23 DIAGNOSIS — R110 Nausea: Secondary | ICD-10-CM

## 2013-07-23 DIAGNOSIS — M4120 Other idiopathic scoliosis, site unspecified: Secondary | ICD-10-CM

## 2013-07-23 DIAGNOSIS — IMO0001 Reserved for inherently not codable concepts without codable children: Secondary | ICD-10-CM | POA: Diagnosis present

## 2013-07-23 DIAGNOSIS — N951 Menopausal and female climacteric states: Secondary | ICD-10-CM

## 2013-07-23 DIAGNOSIS — K644 Residual hemorrhoidal skin tags: Secondary | ICD-10-CM

## 2013-07-23 DIAGNOSIS — F332 Major depressive disorder, recurrent severe without psychotic features: Principal | ICD-10-CM | POA: Diagnosis present

## 2013-07-23 DIAGNOSIS — Z79899 Other long term (current) drug therapy: Secondary | ICD-10-CM

## 2013-07-23 DIAGNOSIS — F411 Generalized anxiety disorder: Secondary | ICD-10-CM | POA: Diagnosis present

## 2013-07-23 DIAGNOSIS — M129 Arthropathy, unspecified: Secondary | ICD-10-CM

## 2013-07-23 DIAGNOSIS — M419 Scoliosis, unspecified: Secondary | ICD-10-CM

## 2013-07-23 DIAGNOSIS — R45851 Suicidal ideations: Secondary | ICD-10-CM

## 2013-07-23 DIAGNOSIS — M797 Fibromyalgia: Secondary | ICD-10-CM

## 2013-07-23 MED ORDER — ESTROGENS CONJUGATED 0.625 MG PO TABS
0.6250 mg | ORAL_TABLET | Freq: Every day | ORAL | Status: DC
  Administered 2013-07-24 – 2013-07-28 (×5): 0.625 mg via ORAL
  Filled 2013-07-23 (×8): qty 1
  Filled 2013-07-23: qty 3

## 2013-07-23 MED ORDER — TRAZODONE HCL 100 MG PO TABS
50.0000 mg | ORAL_TABLET | Freq: Every evening | ORAL | Status: DC | PRN
  Administered 2013-07-24 – 2013-07-26 (×3): 50 mg via ORAL
  Filled 2013-07-23 (×9): qty 1
  Filled 2013-07-23: qty 3
  Filled 2013-07-23 (×3): qty 1
  Filled 2013-07-23: qty 3
  Filled 2013-07-23 (×2): qty 1

## 2013-07-23 MED ORDER — TRAZODONE HCL 50 MG PO TABS: 50.0000 mg | ORAL_TABLET | Freq: Every evening | ORAL | Status: DC | PRN

## 2013-07-23 MED ORDER — PANTOPRAZOLE SODIUM 40 MG PO TBEC
40.0000 mg | DELAYED_RELEASE_TABLET | Freq: Every day | ORAL | Status: DC
  Administered 2013-07-24 – 2013-07-28 (×5): 40 mg via ORAL
  Filled 2013-07-23 (×7): qty 1
  Filled 2013-07-23: qty 3

## 2013-07-23 MED ORDER — PROGESTERONE MICRONIZED 100 MG PO CAPS
100.0000 mg | ORAL_CAPSULE | Freq: Every day | ORAL | Status: DC
  Filled 2013-07-23: qty 1

## 2013-07-23 MED ORDER — METOPROLOL TARTRATE 25 MG PO TABS: 25.0000 mg | ORAL_TABLET | Freq: Two times a day (BID) | ORAL | Status: DC | PRN

## 2013-07-23 MED ORDER — NICOTINE 21 MG/24HR TD PT24
21.0000 mg | MEDICATED_PATCH | Freq: Every day | TRANSDERMAL | Status: DC
  Filled 2013-07-23: qty 1

## 2013-07-23 MED ORDER — DICYCLOMINE HCL 10 MG PO CAPS: 10.0000 mg | ORAL_CAPSULE | Freq: Four times a day (QID) | ORAL | Status: DC | PRN

## 2013-07-23 MED ORDER — MAGNESIUM HYDROXIDE 400 MG/5ML PO SUSP
30.0000 mL | Freq: Every day | ORAL | Status: DC | PRN
  Filled 2013-07-23: qty 30

## 2013-07-23 MED ORDER — ACETAMINOPHEN 325 MG PO TABS
650.0000 mg | ORAL_TABLET | Freq: Four times a day (QID) | ORAL | Status: DC | PRN
  Administered 2013-07-23 – 2013-07-25 (×3): 650 mg via ORAL
  Filled 2013-07-23: qty 2

## 2013-07-23 MED ORDER — ONDANSETRON HCL 4 MG PO TABS: 4.0000 mg | ORAL_TABLET | Freq: Three times a day (TID) | ORAL | Status: DC | PRN

## 2013-07-23 MED ORDER — METOPROLOL TARTRATE 50 MG PO TABS: 25.0000 mg | ORAL_TABLET | Freq: Two times a day (BID) | ORAL | Status: DC | PRN

## 2013-07-23 MED ORDER — PROGESTERONE MICRONIZED 100 MG PO CAPS
100.0000 mg | ORAL_CAPSULE | Freq: Every day | ORAL | Status: DC
  Administered 2013-07-23 – 2013-07-27 (×5): 100 mg via ORAL
  Filled 2013-07-23 (×3): qty 1
  Filled 2013-07-23: qty 3
  Filled 2013-07-23 (×5): qty 1

## 2013-07-23 MED ORDER — ALUM & MAG HYDROXIDE-SIMETH 200-200-20 MG/5ML PO SUSP: 30.0000 mL | ORAL | Status: DC | PRN

## 2013-07-23 MED ORDER — PANTOPRAZOLE SODIUM 40 MG PO TBEC
40.0000 mg | DELAYED_RELEASE_TABLET | Freq: Every day | ORAL | Status: DC
  Administered 2013-07-23: 40 mg via ORAL
  Filled 2013-07-23: qty 1

## 2013-07-23 MED ORDER — DICYCLOMINE HCL 10 MG PO CAPS
10.0000 mg | ORAL_CAPSULE | Freq: Four times a day (QID) | ORAL | Status: DC | PRN
  Filled 2013-07-23: qty 1

## 2013-07-23 MED ORDER — ESTROGENS CONJUGATED 0.625 MG PO TABS
0.6250 mg | ORAL_TABLET | Freq: Every day | ORAL | Status: DC
Start: 2013-07-23 — End: 2013-07-23
  Administered 2013-07-23: 0.625 mg via ORAL
  Filled 2013-07-23: qty 1

## 2013-07-23 NOTE — ED Notes (Signed)
PATIENT HAS BEEN ACCEPTED AT [facility]. SPOKE TO DONNA. GAVE HER REPORT. SHE ADVISES TO LET PT LEAVE OUR ED AT 1930.

## 2013-07-23 NOTE — ED Notes (Signed)
This nurse spoke with Patty from Poison control and this nurse gave her an update on vitals, pts mentation, meds given and recent tylenol level. Patty stated she will close the case at this moment.

## 2013-07-23 NOTE — ED Notes (Signed)
Patient beginnning to question how long she will be held here in the emergency department. Explained to pt that she is awaiting a bed to become available for her to be moved into.

## 2013-07-23 NOTE — Progress Notes (Signed)
Pt came to the nurses station in tears and apologized for her behavior and writer talked to pt and pt seemed very sincere.

## 2013-07-23 NOTE — ED Notes (Signed)
Patient given water per request.

## 2013-07-23 NOTE — Progress Notes (Signed)
Pt got upset and was complaining that she wanted to leave, pt stated she was going to call the police, and she was going to call her husband to pick her up tonight. Writer tried to explain the 72 hr discharge paperwork, but pt got upset with writer walked away and did not want to talk. Another attempt to talk to pt after the pt went into the phone room and attempted to make a call, was unsuccessful, because the pt just walked away from the writer fussing. The situation was explained to charge nurse.

## 2013-07-23 NOTE — ED Notes (Signed)
PT AMBULATORY TO DESK TO CALL HER FAMILY TO BRING HER GLASSES. PT STATES SHE CANNOT SEE ANYTHING WITHOUT THEM BECAUSE SHE IS VERY NEARSIGHTED

## 2013-07-23 NOTE — ED Notes (Signed)
Patient brought meal tray. 

## 2013-07-23 NOTE — ED Provider Notes (Signed)
7:09 PM patient accepted to [facility]. Will transfer.  Audree Camel, MD 07/23/13 1910

## 2013-07-23 NOTE — BH Assessment (Signed)
Tele Assessment Note   Margaret Wood is an 52 y.o. female. Pt presents to [facility] via EMS after suicide attempt last night. Pt sts she intentionally ingested approx. 20 Xanax 0.5, 4 benadryl, and 4 tramadol. Pt says, "I have just been really overwhelmed and depressed". She says she left a note after ingestion and walked into woods. Pt is vague as to contents of note. Pt does say that she wrote that she loved her husband "but couldn't take it anymore". Pt has no prior suicide attempts. She reports current stressors including her sister and brother relying on her too much emotionally and financially. She also cites her depression which she has experienced since her teenage years as a source of current stress. Pt states she is formerly [employer] and when she left the church at age 65, she lost her entire community as the church shunned her and her family d/t their departure. Pt polite and cooperative. She keeps her eyes closed b/c she can't find her glasses and can't see screen well. She is oriented. Pt sts she can contract for safety. Pt endorses depressed mood with isolating, tearfulness, loss of appetite, insomnia, irritability, and fatigue. She currently denies SI. Pt denies HI and [facility]. No delusions noted. She doesn't have a current MH provider. Her PCP Dr. Dayton Martes writes her script for Xanax  which she takes to help her sleep. She has never been inpatient and saw a psychiatrist one time as a teenager for depression. Pt is in need of inpatient admission as she carried out her plan for suicide including leaving suicide note for her husband. Pt sts she used to take Prozac 20 yrs ago but "it didn't do anything". Pt sts she doesn't remember who found her in woods.    Axis I: Major Depressive Disorder, Recurrent, Severe without Psychotic Features Axis II: Deferred Axis III:  Past Medical History  Diagnosis Date  . Palpitations   . Fibromyalgia   . Scoliosis   . Arthritis   . Depression    Axis IV:  other psychosocial or environmental problems and problems related to social environment Axis V: 31-40 impairment in reality testing  Past Medical History:  Past Medical History  Diagnosis Date  . Palpitations   . Fibromyalgia   . Scoliosis   . Arthritis   . Depression     Past Surgical History  Procedure Laterality Date  . Appendectomy  1980  . Cesarean section  1995  . Cholecystectomy  1997    Family History: No family history on file.  Social History:  reports that she has never smoked. She does not have any smokeless tobacco history on file. She reports that she does not drink alcohol or use illicit drugs.  Additional Social History:  Alcohol / Drug Use Pain Medications: see PTA meds list Prescriptions: see PTA meds list Over the Counter: see PTA meds list History of alcohol / drug use?: No history of alcohol / drug abuse Longest period of sobriety (when/how long): n/a  CIWA: CIWA-Ar BP: 96/65 mmHg Pulse Rate: 96 COWS:    Allergies:  Allergies  Allergen Reactions  . Amoxicillin Nausea Only  . Nsaids     REACTION: abd pain  . Tegretol [Carbamazepine]     Made her ill    Home Medications:  (Not in a hospital admission)  OB/GYN Status:  Patient's last menstrual period was 06/17/2013.  General Assessment Data Location of Assessment: [facility] Is this a Tele or Face-to-Face Assessment?: Tele Assessment  Is this an Initial Assessment or a Re-assessment for this encounter?: Initial Assessment Living Arrangements: Spouse/significant other;Children (husband and twin 69 yo boys) Can pt return to current living arrangement?: Yes Admission Status: Voluntary Is patient capable of signing voluntary admission?: Yes Transfer from: Acute Hospital Referral Source: Other (EMS)     [facility] Crisis Care Plan Living Arrangements: Spouse/significant other;Children (husband and twin 25 yo boys)  Education Status Is patient currently in school?: No Current  Grade: na Highest grade of school patient has completed: 12 HS grad  Risk to self Suicidal Ideation: No Suicidal Intent: No Is patient at risk for suicide?: Yes Suicidal Plan?: No (pt denies SI, intent or plan) Access to Means: Yes Specify Access to Suicidal Means: access to pills as she took OD last night What has been your use of drugs/alcohol within the last 12 months?: none Previous Attempts/Gestures: No How many times?: 0 Other Self Harm Risks: none Intentional Self Injurious Behavior: None Family Suicide History: No Recent stressful life event(s): Conflict (Comment);Other (Comment) (family stress including brother & sister relying on her too ) Persecutory voices/beliefs?: No Depression: Yes Depression Symptoms: Insomnia;Tearfulness;Isolating;Feeling angry/irritable;Fatigue (loss of appetite) Substance abuse history and/or treatment for substance abuse?: No Suicide prevention information given to non-admitted patients: Not applicable  Risk to Others Homicidal Ideation: No Thoughts of Harm to Others: No Current Homicidal Intent: No Current Homicidal Plan: No Access to Homicidal Means: No Identified Victim: none History of harm to others?: No Assessment of Violence: None Noted Violent Behavior Description: none Does patient have access to weapons?: No Criminal Charges Pending?: No Does patient have a court date: No  Psychosis Hallucinations: None noted Delusions: None noted  Mental Status Report Appear/Hygiene: Other (Comment) (appropriate) Eye Contact: Other (Comment) (pt kept eyes closed b/c doesn't have her glasses andcantsee) Motor Activity: Freedom of movement Speech: Logical/coherent Level of Consciousness: Alert Mood: Depressed;Sad Affect: Appropriate to circumstance;Depressed Anxiety Level: Minimal Thought Processes: Coherent;Relevant Judgement: Unimpaired Orientation: Person;Place;Time;Situation Obsessive Compulsive Thoughts/Behaviors:  None  Cognitive Functioning Concentration: Normal Memory: Remote Intact;Recent Intact IQ: Average Insight: Fair Impulse Control: Poor Appetite: Fair Weight Loss: 0 Weight Gain: 0 Sleep: Decreased Total Hours of Sleep: 4 Vegetative Symptoms: None  ADLScreening [facility] Assessment Services) Patient's cognitive ability adequate to safely complete daily activities?: Yes Patient able to express need for assistance with ADLs?: Yes Independently performs ADLs?: Yes (appropriate for developmental age)  Prior Inpatient Therapy Prior Inpatient Therapy: No Prior Therapy Dates: none Prior Therapy Facilty/Provider(s): none Reason for Treatment: none  Prior Outpatient Therapy Prior Outpatient Therapy: Yes Prior Therapy Dates: 30 yrs ago Prior Therapy Facilty/Provider(s): saw an MD once Reason for Treatment: depression  ADL Screening (condition at time of admission) Patient's cognitive ability adequate to safely complete daily activities?: Yes Is the patient deaf or have difficulty hearing?: No Does the patient have difficulty seeing, even when wearing glasses/contacts?: No Does the patient have difficulty concentrating, remembering, or making decisions?: No Patient able to express need for assistance with ADLs?: Yes Does the patient have difficulty dressing or bathing?: No Independently performs ADLs?: Yes (appropriate for developmental age) Does the patient have difficulty walking or climbing stairs?: No Weakness of Legs: None Weakness of Arms/Hands: None  Home Assistive Devices/Equipment Home Assistive Devices/Equipment: Eyeglasses    Abuse/Neglect Assessment (Assessment to be complete while patient is alone) Physical Abuse: Denies Verbal Abuse: Denies Sexual Abuse: Denies Exploitation of patient/patient's resources: Denies Self-Neglect: Denies Values / Beliefs Cultural Requests During Hospitalization: None Spiritual Requests During Hospitalization: None   Advance  Directives  (For Healthcare) Advance Directive: Patient does not have advance directive;Patient would not like information    Additional Information 1:1 In Past 12 Months?: No CIRT Risk: No Elopement Risk: No Does patient have medical clearance?: Yes     Disposition:  Disposition Initial Assessment Completed for this Encounter: Yes Disposition of Patient: Inpatient treatment program Type of inpatient treatment program: Adult  Thornell Sartorius 07/23/2013 10:29 AM

## 2013-07-23 NOTE — Treatment Plan (Signed)
Pt accepted to [facility] by Dr. Lucianne Muss to room [phone].

## 2013-07-23 NOTE — ED Provider Notes (Signed)
Medical screening examination/treatment/procedure(s) were conducted as a shared visit with non-physician practitioner(s) and myself.  I personally evaluated the patient during the encounter  Acute overdose of benadryl, xanax, tramadol in suicide attempt. Sleepy but arousable, protecting airway and following commands.  Glynn Octave, MD 07/23/13 [phone]

## 2013-07-23 NOTE — ED Notes (Signed)
Pt placed in paper scrubs.

## 2013-07-23 NOTE — Progress Notes (Signed)
Patient ID: Margaret Wood, female   DOB: [birth year], 52 y.o.   MRN: [id number] Admission note: D:Patient is a  Voluntary admission in no acute distress for depression and drug overdose. Pt stated she told husband yesterday that she was depressed and stressed out but he didn't believe her and he left the house to get take out. Pt reports she took a handful of 0.25 mg of xanax and run to the woods. Husband called police and search dogs found her passed out in the woods. Pt reports stressed out because she left her church ([employer]) 17 years ago because she doesn't believe in their doctrine. She stated losing a lot of her support system from the church. Her family have problems with alcoholism and she is tired of taking care of them. Pt also states her hormone imbalance is part to blame for her depression. Pt is currently taking estrogen and progesterone. Pt sign a 72 hour discharge because she was not allowed to bring her kindle to the unit.   A: Pt admitted to unit per protocol, skin assessment and belonging search done. No skin issues noted. Consent signed by pt. Pt educated on therapeutic milieu rules. Pt was introduced to milieu by nursing staff. Fall risk safety plan explained to the patient. 15 minutes checks started for safety.  R: Pt was receptive to education. Writer offered support.

## 2013-07-23 NOTE — ED Notes (Signed)
TELE ACT COMPLETED WITH PAIGE.

## 2013-07-24 ENCOUNTER — Telehealth: Payer: Self-pay | Admitting: *Deleted

## 2013-07-24 DIAGNOSIS — F332 Major depressive disorder, recurrent severe without psychotic features: Secondary | ICD-10-CM | POA: Diagnosis present

## 2013-07-24 DIAGNOSIS — R45851 Suicidal ideations: Secondary | ICD-10-CM

## 2013-07-24 DIAGNOSIS — F411 Generalized anxiety disorder: Secondary | ICD-10-CM | POA: Diagnosis present

## 2013-07-24 MED ORDER — BUSPIRONE HCL 15 MG PO TABS
7.5000 mg | ORAL_TABLET | Freq: Two times a day (BID) | ORAL | Status: DC
  Administered 2013-07-24 – 2013-07-25 (×2): 7.5 mg via ORAL
  Filled 2013-07-24 (×7): qty 1

## 2013-07-24 MED ORDER — DULOXETINE HCL 20 MG PO CPEP
20.0000 mg | ORAL_CAPSULE | Freq: Every day | ORAL | Status: DC
  Administered 2013-07-24 – 2013-07-25 (×2): 20 mg via ORAL
  Filled 2013-07-24 (×5): qty 1

## 2013-07-24 MED ORDER — DULOXETINE HCL 20 MG PO CPEP
20.0000 mg | ORAL_CAPSULE | Freq: Every day | ORAL | Status: DC
  Filled 2013-07-24 (×2): qty 1

## 2013-07-24 MED ORDER — DIPHENHYDRAMINE HCL 25 MG PO CAPS
25.0000 mg | ORAL_CAPSULE | Freq: Four times a day (QID) | ORAL | Status: DC | PRN
  Administered 2013-07-24 – 2013-07-27 (×2): 25 mg via ORAL

## 2013-07-24 MED ORDER — BUSPIRONE HCL 15 MG PO TABS
7.5000 mg | ORAL_TABLET | Freq: Two times a day (BID) | ORAL | Status: DC
  Filled 2013-07-24 (×2): qty 1

## 2013-07-24 NOTE — [facility] Counselor (Signed)
Adult Comprehensive Assessment  Patient ID: Margaret Wood, female   DOB: [birth year], 52 y.o.   MRN: [id number]  Information Source: Information source: Patient  Current Stressors:  Educational / Learning stressors: None Employment / Job issues: None Family Relationships: Stressors related to brother and sister who have addiction problems Surveyor, quantity / Lack of resources (include bankruptcy): None Housing / Lack of housing: None Physical health (include injuries & life threatening diseases): Fibromyaligia , Scoliosis and IBS Social relationships: None Bereavement / Loss: Loss father eight years ago - mother-in-law of three years ago and sister-in-law to suicide  Living/Environment/Situation:  Living Arrangements: Spouse/significant other;Children Living conditions (as described by patient or guardian): good How long has patient lived in current situation?: Two years What is atmosphere in current home: Comfortable;Loving;Supportive  Family History:  Marital status: Married Number of Years Married: 27 What types of issues is patient dealing with in the relationship?: None Additional relationship information: None Does patient have children?: Yes How many children?: 2 How is patient's relationship with their children?: Gets along well with twin sons who are 18  Childhood History:  By whom was/is the patient raised?: Both parents Additional childhood history information: Realy good Description of patient's relationship with caregiver when they were a child: Good relationships Patient's description of current relationship with people who raised him/her: Good relationship wth mother - father is deceased Does patient have siblings?: Yes Number of Siblings: 2 Description of patient's current relationship with siblings: For the most part, good relationships  Did patient suffer any verbal/emotional/physical/sexual abuse as a child?: No Did patient suffer from severe childhood neglect?:  No Has patient ever been sexually abused/assaulted/raped as an adolescent or adult?: No Was the patient ever a victim of a crime or a disaster?: No Witnessed domestic violence?: No Has patient been effected by domestic violence as an adult?: No  Education:  Highest grade of school patient has completed: High school Currently a student?: No  Employment/Work Situation:   Employment situation: Unemployed Patient's job has been impacted by current illness: No What is the longest time patient has a held a job?: two years 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 Has patient ever been in the [employer]?: No Has patient ever served in Buyer, retail?: No  Financial Resources:   Financial resources: Income from spouse Does patient have a representative payee or guardian?: No  Alcohol/Substance Abuse:   What has been your use of drugs/alcohol within the last 12 months?: None If attempted suicide, did drugs/alcohol play a role in this?: No Alcohol/Substance Abuse Treatment Hx: Denies past history Has alcohol/substance abuse ever caused legal problems?: No  Social Support System:   Forensic psychologist System: None Describe Community Support System: N/A Type of faith/religion: None How does patient's faith help to cope with current illness?: N/A  Leisure/Recreation:   Leisure and Hobbies: Reads a lot  Strengths/Needs:   What things does the patient do well?: Likes to draw In what areas does patient struggle / problems for patient: None  Discharge Plan:   Does patient have access to transportation?: Yes Will patient be returning to same living situation after discharge?: Yes Currently receiving community mental health services: No If no, would patient like referral for services when discharged?: Yes (What county?) ([place]) Does patient have financial barriers related to discharge medications?: No  Summary/Recommendations:  Margaret Wood is a 52 year old  Caucasian female admitted with Major Depression Disorder.  She will benefit from crisis stabilization, evaluation for medication,  psycho-education groups for coping skills development, group therapy and case management for discharge planning.     Margaret Wood, Margaret Wood July. 07/24/2013

## 2013-07-24 NOTE — Progress Notes (Signed)
NP notified of pt's complaint of itching. Pt concerned she is having a drug reaction to new meds buspar and cymabalta that she received at 1800 however bites noted on observation. Per NP instructions, pt assisted into shower, linens changed, mattress and pillow case washed down with purple top wipes.  Roommate moved and order received for private room. Clothes gathered and placed in washer with hot soapy water. Pt currently in gowns. Continues to complain of itching. Reports finding 3 small ticks while showering. Pt given benardryl along with hs meds. She reports mood improvement. "I will never do that again (SI attempt). Next time I will go to my husband and I know he will take me seriously." Pt flat, depressed. Slow to process at times. Denies SI/HI/AVH and remains safe. Lawrence Marseilles

## 2013-07-24 NOTE — [facility] Group Notes (Signed)
[facility] LCSW Group Therapy  [facility] 1:15 - 2:30 PM  07/24/2013   Type of Therapy:  Group Therapy  Participation Level:  Minimal  Participation Quality:  Attentive  Affect:  Appropriate  Cognitive:  Appropriate  Insight:  Developing/Improving and Engaged  Engagement in Therapy:  Developing/Improving Engaged  Modes of Intervention:  Discussion, Education, Exploration, Problem-Solving, Rapport Building, Support   Summary of Progress/Problems:  Patient listened attentively to speaker from [facility].  Patient made no comments on the presentation.  Wynn Banker 07/24/2013

## 2013-07-24 NOTE — H&P (Signed)
Psychiatric Admission Assessment Adult  Patient Identification:  Margaret Wood Date of Evaluation:  07/24/2013 Chief Complaint:  MAJOR DEPRESSIVE DISORDER History of Present Illness: Patient admitted voluntarily and emergently from process: Emergency department for the diagnosis of major depressive disorder anxiety and suicide attempt with overdose on her medication.  patient reported she has been feeling overwhelmed anxiety and has taken intentionally  overdose on her medication approximately 20 Xanax 0.5, 4 benadryl, and 4 tramadol with the intention to kill herself. She says she left a note after ingestion and walked into woods.  patient reported he having symptoms of depression anxiety crying repeat lites and also has anxiety each episode consists of uncontrollable anxiety, increased heart rate, flushing in her face, cannot sit still and has a fright response. Patient wrote a suicide note saying that she loved her husband "but couldn't take it anymore". She reports current stressors including her sister and brother relying on her too much emotionally and financially.  Patient has been experiencing symptoms of depression since she was teenage years and was received medication in the past but did not work well for her which is Prozac. Reportedly she was  formerly [employer] and when she left the church at age 52. Reportedly she was alienated by her entire family  andentire community as the church shunned her and her family  because of their departure religiously. Patient endorses depressed mood with isolating, tearfulness, loss of appetite, insomnia, irritability, and fatigue. Her PCP Dr. Dayton Martes writes her script for Xanax which she takes to  as needed. She has never been inpatient and saw a psychiatrist one time as a teenager for depression.  Patient states she doesn't remember who found her in woods.  Elements:  Location:  [facility] adult unit. Quality:  depression and anxiety. Severity:  overdose on  xanax. Timing:  family stresses. Duration:  few weeks. Context:  needs crisis stabilization. Associated Signs/Synptoms: Depression Symptoms:  depressed mood, anhedonia, insomnia, psychomotor retardation, fatigue, feelings of worthlessness/guilt, difficulty concentrating, hopelessness, impaired memory, recurrent thoughts of death, anxiety, panic attacks, disturbed sleep, decreased labido, decreased appetite, (Hypo) Manic Symptoms:  Impulsivity, Anxiety Symptoms:  Excessive Worry, Panic Symptoms, Psychotic Symptoms:  no PTSD Symptoms: NA  Psychiatric Specialty Exam: Physical Exam  ROS  Blood pressure 120/83, pulse 108, temperature 98 F (36.7 C), temperature source Oral, resp. rate 18, height 5\' 2"  (1.575 m), weight 86.637 kg (191 lb), last menstrual period 06/17/2013.Body mass index is 34.93 kg/(m^2).  General Appearance: Casual and Fairly Groomed  Patent attorney::  Fair  Speech:  Clear and Coherent and Normal Rate  Volume:  Normal  Mood:  Anxious, Depressed, Hopeless and Worthless  Affect:  Congruent and Depressed  Thought Process:  Goal Directed, Intact and Linear  Orientation:  Full (Time, Place, and Person)  Thought Content:  Rumination  Suicidal Thoughts:  Yes.  with intent/plan  Homicidal Thoughts:  No  Memory:  Immediate;   Fair  Judgement:  Impaired  Insight:  Lacking  Psychomotor Activity:  Psychomotor Retardation and Restlessness  Concentration:  Fair  Recall:  Fair  Akathisia:  NA  Handed:  Right  AIMS (if indicated):     Assets:  Communication Skills Desire for Improvement Financial Resources/Insurance Physical Health Resilience Social Support Transportation  Sleep:       Past Psychiatric History: Diagnosis: anxiety  Hospitalizations:no  Outpatient Care:no  Substance Abuse Care:no  Self-Mutilation:no  Suicidal Attempts: yes  Violent Behaviors:no   Past Medical History:   Past Medical History  Diagnosis Date  . Palpitations   .  Fibromyalgia   . Scoliosis   . Arthritis   . Depression    None. Allergies:   Allergies  Allergen Reactions  . Amoxicillin Nausea Only  . Nsaids     REACTION: abd pain  . Tegretol [Carbamazepine]     Made her ill   PTA Medications: Prescriptions prior to admission  Medication Sig Dispense Refill  . acetaminophen (TYLENOL) 500 MG tablet Take 500 mg by mouth every 6 (six) hours as needed.        . ALPRAZolam (XANAX) 0.25 MG tablet Take 0.25 mg by mouth at bedtime as needed for sleep.      . cyclobenzaprine (FLEXERIL) 10 MG tablet Take 10 mg by mouth at bedtime. 1 tablet by mouth at bedtime.      . dicyclomine (BENTYL) 10 MG capsule Take 10-20 mg by mouth 4 (four) times daily. 1-2 tablets four times daily      . estrogens, conjugated, (PREMARIN) 0.625 MG tablet Take 0.625 mg by mouth daily. Take daily for 21 days then do not take for 7 days.      . pantoprazole (PROTONIX) 40 MG tablet Take 40 mg by mouth as needed.      . progesterone (PROMETRIUM) 100 MG capsule Take 100 mg by mouth at bedtime. 1 tablet by mouth nightly      . traMADol (ULTRAM) 50 MG tablet Take 50 mg by mouth 2 (two) times daily as needed for pain.      . [DISCONTINUED] cyclobenzaprine (FLEXERIL) 10 MG tablet 1 tablet by mouth at bedtime.  30 tablet  0  . [DISCONTINUED] dicyclomine (BENTYL) 10 MG capsule 1-2 tablets four times daily  240 capsule  5  . [DISCONTINUED] progesterone (PROMETRIUM) 100 MG capsule 1 tablet by mouth nightly  30 capsule  5  . metoprolol tartrate (LOPRESSOR) 25 MG tablet Take 25 mg by mouth 2 (two) times daily as needed (arrythmia).         Previous Psychotropic Medications:  Medication/Dose  Xanax by PCP               Substance Abuse History in the last 12 months:  no  Consequences of Substance Abuse: NA  Social History:  reports that she has never smoked. She does not have any smokeless tobacco history on file. She reports that she does not drink alcohol or use illicit  drugs. Additional Social History:  Current Place of Residence:   Place of Birth:   Family Members: Marital Status:  Married Children:  Sons:  Daughters: Relationships: Education:  [employer] Problems/Performance: Religious Beliefs/Practices: History of Abuse (Emotional/Phsycial/Sexual) Teacher, music History:  None. Legal History: Hobbies/Interests:  Family History:  History reviewed. No pertinent family history.  Results for orders placed during the hospital encounter of 07/22/13 (from the past 72 hour(s))  GLUCOSE, CAPILLARY     Status: None   Collection Time    07/22/13  9:30 PM      Result Value Range   Glucose-Capillary 74  70 - 99 mg/dL   Comment 1 Documented in Chart     Comment 2 Notify RN    URINE RAPID DRUG SCREEN (HOSP PERFORMED)     Status: Abnormal   Collection Time    07/22/13  9:54 PM      Result Value Range   Opiates NONE DETECTED  NONE DETECTED   Cocaine NONE DETECTED  NONE DETECTED   Benzodiazepines POSITIVE (*) NONE DETECTED  Amphetamines NONE DETECTED  NONE DETECTED   Tetrahydrocannabinol NONE DETECTED  NONE DETECTED   Barbiturates NONE DETECTED  NONE DETECTED   Comment:            DRUG SCREEN FOR MEDICAL PURPOSES     ONLY.  IF CONFIRMATION IS NEEDED     FOR ANY PURPOSE, NOTIFY LAB     WITHIN 5 DAYS.                LOWEST DETECTABLE LIMITS     FOR URINE DRUG SCREEN     Drug Class       Cutoff (ng/mL)     Amphetamine      1000     Barbiturate      200     Benzodiazepine   200     Tricyclics       300     Opiates          300     Cocaine          300     THC              50  POCT PREGNANCY, URINE     Status: None   Collection Time    07/22/13 10:00 PM      Result Value Range   Preg Test, Ur NEGATIVE  NEGATIVE   Comment:            THE SENSITIVITY OF THIS     METHODOLOGY IS >24 mIU/mL  CBC     Status: Abnormal   Collection Time    07/22/13 10:11 PM      Result Value Range   WBC 8.4  4.0 - 10.5 K/uL    RBC 5.01  3.87 - 5.11 MIL/uL   Hemoglobin 15.9 (*) 12.0 - 15.0 g/dL   HCT 08.6  57.8 - 46.9 %   MCV 88.0  78.0 - 100.0 fL   MCH 31.7  26.0 - 34.0 pg   MCHC 36.1 (*) 30.0 - 36.0 g/dL   RDW 62.9  52.8 - 41.3 %   Platelets 262  150 - 400 K/uL  COMPREHENSIVE METABOLIC PANEL     Status: Abnormal   Collection Time    07/22/13 10:11 PM      Result Value Range   Sodium 137  135 - 145 mEq/L   Potassium 3.7  3.5 - 5.1 mEq/L   Chloride 101  96 - 112 mEq/L   CO2 24  19 - 32 mEq/L   Glucose, Bld 75  70 - 99 mg/dL   BUN 10  6 - 23 mg/dL   Creatinine, Ser 2.44  0.50 - 1.10 mg/dL   Calcium 9.5  8.4 - 01.0 mg/dL   Total Protein 8.4 (*) 6.0 - 8.3 g/dL   Albumin 4.4  3.5 - 5.2 g/dL   AST 17  0 - 37 U/L   ALT 9  0 - 35 U/L   Alkaline Phosphatase 58  39 - 117 U/L   Total Bilirubin 1.3 (*) 0.3 - 1.2 mg/dL   GFR calc non Af Amer 73 (*) >90 mL/min   GFR calc Af Amer 85 (*) >90 mL/min   Comment: (NOTE)     The eGFR has been calculated using the CKD EPI equation.     This calculation has not been validated in all clinical situations.     eGFR's persistently <90 mL/min signify possible Chronic Kidney     Disease.  ETHANOL  Status: None   Collection Time    07/22/13 10:11 PM      Result Value Range   Alcohol, Ethyl (B) <11  0 - 11 mg/dL   Comment:            LOWEST DETECTABLE LIMIT FOR     SERUM ALCOHOL IS 11 mg/dL     FOR MEDICAL PURPOSES ONLY  ACETAMINOPHEN LEVEL     Status: None   Collection Time    07/22/13 10:11 PM      Result Value Range   Acetaminophen (Tylenol), Serum <15.0  10 - 30 ug/mL   Comment:            THERAPEUTIC CONCENTRATIONS VARY     SIGNIFICANTLY. A RANGE OF 10-30     ug/mL MAY BE AN EFFECTIVE     CONCENTRATION FOR MANY PATIENTS.     HOWEVER, SOME ARE BEST TREATED     AT CONCENTRATIONS OUTSIDE THIS     RANGE.     ACETAMINOPHEN CONCENTRATIONS     >150 ug/mL AT 4 HOURS AFTER     INGESTION AND >50 ug/mL AT 12     HOURS AFTER INGESTION ARE     OFTEN ASSOCIATED  WITH TOXIC     REACTIONS.  SALICYLATE LEVEL     Status: Abnormal   Collection Time    07/22/13 10:11 PM      Result Value Range   Salicylate Lvl <2.0 (*) 2.8 - 20.0 mg/dL  ACETAMINOPHEN LEVEL     Status: None   Collection Time    07/23/13 12:52 AM      Result Value Range   Acetaminophen (Tylenol), Serum <15.0  10 - 30 ug/mL   Comment:            THERAPEUTIC CONCENTRATIONS VARY     SIGNIFICANTLY. A RANGE OF 10-30     ug/mL MAY BE AN EFFECTIVE     CONCENTRATION FOR MANY PATIENTS.     HOWEVER, SOME ARE BEST TREATED     AT CONCENTRATIONS OUTSIDE THIS     RANGE.     ACETAMINOPHEN CONCENTRATIONS     >150 ug/mL AT 4 HOURS AFTER     INGESTION AND >50 ug/mL AT 12     HOURS AFTER INGESTION ARE     OFTEN ASSOCIATED WITH TOXIC     REACTIONS.   Psychological Evaluations:  Assessment:   DSM5:  Schizophrenia Disorders:   Obsessive-Compulsive Disorders:   Trauma-Stressor Disorders:   Substance/Addictive Disorders:   Depressive Disorders:  Major Depressive Disorder - Severe (296.23)  AXIS I:  Generalized Anxiety Disorder and Major Depression, Recurrent severe AXIS II:  Deferred AXIS III:   Past Medical History  Diagnosis Date  . Palpitations   . Fibromyalgia   . Scoliosis   . Arthritis   . Depression    AXIS IV:  economic problems, other psychosocial or environmental problems, problems related to social environment and problems with primary support group AXIS V:  41-50 serious symptoms  Treatment Plan/Recommendations:  Admit for crisis stabilization and safety monitoring  Treatment Plan Summary: Daily contact with patient to assess and evaluate symptoms and progress in treatment Medication management Current Medications:  Current Facility-Administered Medications  Medication Dose Route Frequency Provider Last Rate Last Dose  . acetaminophen (TYLENOL) tablet 650 mg  650 mg Oral Q6H PRN Nelly Rout, MD   650 mg at 07/23/13 2338  . alum & mag hydroxide-simeth  (MAALOX/MYLANTA) 200-200-20 MG/5ML suspension 30 mL  30 mL Oral  Q4H PRN Nelly Rout, MD      . dicyclomine (BENTYL) capsule 10 mg  10 mg Oral Q6H PRN Nelly Rout, MD      . estrogens (conjugated) (PREMARIN) tablet 0.625 mg  0.625 mg Oral Daily Nelly Rout, MD   0.625 mg at 07/24/13 1610  . magnesium hydroxide (MILK OF MAGNESIA) suspension 30 mL  30 mL Oral Daily PRN Nelly Rout, MD      . metoprolol (LOPRESSOR) tablet 25 mg  25 mg Oral BID PRN Nelly Rout, MD      . pantoprazole (PROTONIX) EC tablet 40 mg  40 mg Oral Daily Nelly Rout, MD   40 mg at 07/24/13 9604  . progesterone (PROMETRIUM) capsule 100 mg  100 mg Oral QHS Nelly Rout, MD   100 mg at 07/23/13 2339  . traZODone (DESYREL) tablet 50 mg  50 mg Oral QHS,MR X 1 Spencer E Simon, PA-C   50 mg at 07/24/13 0010    Observation Level/Precautions:  15 minute checks  Laboratory:  reviewed admssion labs  Psychotherapy:  Individual, group and milieu therapy  Medications:  Start Cymbalata for depression and buspar for GAD  Consultations:  None   Discharge Concerns:  safety  Estimated LOS: 4-7 days  Other:     I certify that inpatient services furnished can reasonably be expected to improve the patient's condition.    Nehemiah Settle., MD 8/28/20149:55 AM

## 2013-07-24 NOTE — Progress Notes (Signed)
Adult Psychoeducational Group Note  Date:  07/24/2013 Time:  10:15 PM  Group Topic/Focus:  Making Healthy Choices:   The focus of this group is to help patients identify negative/unhealthy choices they were using prior to admission and identify positive/healthier coping strategies to replace them upon discharge.  Participation Level:  Did Not Attend  Participation Quality:  did not attend  Affect:  did not attend  Cognitive:  did not attend  Insight: None  Engagement in Group:  did not attend  Modes of Intervention:  Problem-solving  Additional Comments:  Did not attend   Annell Greening Casina 07/24/2013, 10:15 PM

## 2013-07-24 NOTE — Progress Notes (Signed)
Adult Psychoeducational Group Note  Date:  07/24/2013 Time:  11:00AM Group Topic/Focus:  Leisure and Lifestyle Changes  Participation Level:  Active  Participation Quality:  Appropriate and Attentive  Affect:  Appropriate  Cognitive:  Alert and Appropriate  Insight: Appropriate  Engagement in Group:  Engaged  Modes of Intervention:  Discussion  Additional Comments: Pt. Was attentive and appropriate during today's group discussion. Pt. Was able to complete self esteem worksheet on positive thing about yourself for each letter A-Z. Pt was able to come up with the following words in a group setting better and honest.      April Manson 07/24/2013, 1:36 PM

## 2013-07-24 NOTE — Progress Notes (Signed)
D:  Patient's self inventory sheet, patient has fair sleep, good appetite, normal energy level, good attention span.  Rated depression and hopelessness #1.  Denied withdrawals.  Denied SI.  "No, just tired!  Not used to 6:00 a.m. Hahaha!  No pain."  Worst pain #1.  After discharge, plans to see MD, takes antidepressants, meds.  No questions for staff.  No problems taking meds after discharge. A:  Medications administered per MD orders.  Emotional support and encouragement given patient. R:  Denied SI and HI.  Denied A/V hallucinations.  Denied pain.  Will continue to monitor patient for safety with 15 minute checks.  Safety maintained.

## 2013-07-24 NOTE — Telephone Encounter (Signed)
Pt's husband has brought in letter explaining to Dr Dayton Martes that pt is in hospital and why, asks for referral for therapy for himself and his family.  Advised him that Dr Alphonsus Sias, his doctor, will address this when he arrives in the office this afternoon.

## 2013-07-24 NOTE — [facility] Suicide Risk Assessment (Signed)
[facility] INPATIENT:  Family/Significant Other Suicide Prevention Education  Suicide Prevention Education:  Education Completed; Margaret Wood, Husband, [phone]  has been identified by the patient as the family member/significant other with whom the patient will be residing, and identified as the person(s) who will aid the patient in the event of a mental health crisis (suicidal ideations/suicide attempt).  With written consent from the patient, the family member/significant other has been provided the following suicide prevention education, prior to the and/or following the discharge of the patient.  The suicide prevention education provided includes the following:  Suicide risk factors  Suicide prevention and interventions  National Suicide Hotline telephone number  [facility] assessment telephone number  [facility] Emergency Assistance 911  [facility] and/or Residential Mobile Crisis Unit telephone number  Request made of family/significant other to:  Remove weapons (e.g., guns, rifles, knives), all items previously/currently identified as safety concern.  Husband advised patient does not have access to guns.  Remove drugs/medications (over-the-counter, prescriptions, illicit drugs), all items previously/currently identified as a safety concern.  The family member/significant other verbalizes understanding of the suicide prevention education information provided.  The family member/significant other agrees to remove the items of safety concern listed above.  Wynn Banker 07/24/2013, 11:34 AM

## 2013-07-24 NOTE — Progress Notes (Signed)
The focus of this group is to educate the patient on the purpose and policies of crisis stabilization and provide a format to answer questions about their admission.  The group details unit policies and expectations of patients while admitted.  Patient attended 0900 nurse education orientation group.  Patient actively participated, appropriate affect, alert, appropriate insight and engagement.  Patient will work on goals for discharge today.  

## 2013-07-24 NOTE — Progress Notes (Signed)
Patient ID: Margaret Wood, female   DOB: 02/07/1961, 52 y.o.   MRN: [id number]  Patient complains of widespread pruritis that has been present since this morning.  She denies known contact with infectious persons. Linear erythematous uniformed purpuric macules noted on lower and upper abdomen.  Advised RN to administer Benadryl as ordered for pruritis. Patient and her roommates clothing and bed linen to be washed in hot water- [employer] notified.  Charge Nurse Patty- RN also notified, new bed assignments to be arranged.

## 2013-07-24 NOTE — Progress Notes (Signed)
[facility] Group Notes:  (Nursing/MHT/Case Management/Adjunct)  Date:  07/24/2013  Time:  10:51 AM  Type of Therapy:  Therapeutic Activity  Participation Level:  Minimal  Participation Quality:  Appropriate and Resistant  Affect:  Appropriate and Flat  Cognitive:  Appropriate  Insight:  Limited  Engagement in Group:  Engaged and Poor  Modes of Intervention:  Activity and Socialization  Summary of Progress/Problems: Pt attended group with minimal participation.  Caswell Corwin 07/24/2013, 10:51 AM

## 2013-07-25 MED ORDER — BUSPIRONE HCL 10 MG PO TABS
10.0000 mg | ORAL_TABLET | Freq: Two times a day (BID) | ORAL | Status: DC
  Administered 2013-07-25 – 2013-07-28 (×6): 10 mg via ORAL
  Filled 2013-07-25 (×5): qty 1
  Filled 2013-07-25 (×2): qty 6
  Filled 2013-07-25 (×5): qty 1

## 2013-07-25 MED ORDER — ONDANSETRON HCL 4 MG PO TABS
4.0000 mg | ORAL_TABLET | Freq: Three times a day (TID) | ORAL | Status: DC | PRN
  Administered 2013-07-25 – 2013-07-28 (×4): 4 mg via ORAL
  Filled 2013-07-25 (×2): qty 1

## 2013-07-25 MED ORDER — DULOXETINE HCL 30 MG PO CPEP
30.0000 mg | ORAL_CAPSULE | Freq: Every day | ORAL | Status: DC
  Administered 2013-07-26 – 2013-07-28 (×3): 30 mg via ORAL
  Filled 2013-07-25 (×3): qty 1
  Filled 2013-07-25: qty 3
  Filled 2013-07-25 (×2): qty 1

## 2013-07-25 NOTE — Progress Notes (Signed)
Adult Psychoeducational Group Note  Date:  07/25/2013 Time:  12:37 PM  Group Topic/Focus:  Relapse Prevention Planning:   The focus of this group is to define relapse and discuss the need for planning to combat relapse.  Participation Level:  Active  Participation Quality:  Appropriate and Attentive  Affect:  Appropriate  Cognitive:  Alert and Appropriate  Insight: Good  Engagement in Group:  Engaged  Modes of Intervention:  Discussion, Socialization and Support  Additional Comments:  Pt came to group and shared that breathing techniques and talking to someone would be her go to techniques when faced with relapse.   Cathlean Cower 07/25/2013, 12:37 PM

## 2013-07-25 NOTE — Progress Notes (Signed)
[facility] MD Progress Note  07/25/2013 12:58 PM EDLA PARA  MRN:  [id number] Subjective:  Patient continues to be restless, anxious and depressed. She has suicidal attempt with her medication with intention to end her life. She has limited insight into her emotional problems. She has no outpatient psychiatric services at this time.   Diagnosis:   DSM5: Schizophrenia Disorders:   Obsessive-Compulsive Disorders:   Trauma-Stressor Disorders:   Substance/Addictive Disorders:   Depressive Disorders:  Major Depressive Disorder - Severe (296.23)  Axis I: Generalized Anxiety Disorder and Major Depression, Recurrent severe  ADL's:  Impaired  Sleep: Poor  Appetite:  Fair  Suicidal Ideation:  Plan:  Yes Intent:  YES Means:  YEs Homicidal Ideation:  NO AEB (as evidenced by):  Psychiatric Specialty Exam: ROS  Blood pressure 119/87, pulse 110, temperature 97.7 F (36.5 C), temperature source Oral, resp. rate 18, height 5\' 2"  (1.575 m), weight 86.637 kg (191 lb), last menstrual period 06/17/2013.Body mass index is 34.93 kg/(m^2).  General Appearance: Bizarre and Guarded  Eye Contact::  Minimal  Speech:  Slow  Volume:  Decreased  Mood:  Anxious, Depressed, Dysphoric, Hopeless and Worthless  Affect:  Congruent, Depressed and Flat  Thought Process:  Irrelevant and Loose  Orientation:  Full (Time, Place, and Person)  Thought Content:  Rumination  Suicidal Thoughts:  Yes.  with intent/plan  Homicidal Thoughts:  No  Memory:  Immediate;   Fair  Judgement:  Impaired  Insight:  Lacking  Psychomotor Activity:  Psychomotor Retardation and Restlessness  Concentration:  Fair  Recall:  Fair  Akathisia:  NA  Handed:  Right  AIMS (if indicated):     Assets:  Communication Skills Desire for Improvement Physical Health Resilience  Sleep:  Number of Hours: 4.75   Current Medications: Current Facility-Administered Medications  Medication Dose Route Frequency Provider Last Rate Last Dose   . acetaminophen (TYLENOL) tablet 650 mg  650 mg Oral Q6H PRN Nelly Rout, MD   650 mg at 07/24/13 2159  . alum & mag hydroxide-simeth (MAALOX/MYLANTA) 200-200-20 MG/5ML suspension 30 mL  30 mL Oral Q4H PRN Nelly Rout, MD      . busPIRone (BUSPAR) tablet 10 mg  10 mg Oral BID Nehemiah Settle, MD      . dicyclomine (BENTYL) capsule 10 mg  10 mg Oral Q6H PRN Nelly Rout, MD      . diphenhydrAMINE (BENADRYL) capsule 25 mg  25 mg Oral Q6H PRN Nehemiah Settle, MD   25 mg at 07/24/13 2100  . [START ON 07/26/2013] DULoxetine (CYMBALTA) DR capsule 30 mg  30 mg Oral Daily Nehemiah Settle, MD      . estrogens (conjugated) (PREMARIN) tablet 0.625 mg  0.625 mg Oral Daily Nelly Rout, MD   0.625 mg at 07/25/13 0930  . magnesium hydroxide (MILK OF MAGNESIA) suspension 30 mL  30 mL Oral Daily PRN Nelly Rout, MD      . metoprolol (LOPRESSOR) tablet 25 mg  25 mg Oral BID PRN Nelly Rout, MD      . ondansetron [facility] tablet 4 mg  4 mg Oral Q8H PRN Nehemiah Settle, MD   4 mg at 07/25/13 0846  . pantoprazole (PROTONIX) EC tablet 40 mg  40 mg Oral Daily Nelly Rout, MD   40 mg at 07/25/13 0930  . progesterone (PROMETRIUM) capsule 100 mg  100 mg Oral QHS Nelly Rout, MD   100 mg at 07/24/13 2100  . traZODone (DESYREL) tablet 50 mg  50  mg Oral QHS,MR X 1 Kerry Hough, PA-C   50 mg at 07/24/13 0010    Lab Results: No results found for this or any previous visit (from the past 48 hour(s)).  Physical Findings: AIMS: Facial and Oral Movements Muscles of Facial Expression: None, normal Lips and Perioral Area: None, normal Jaw: None, normal Tongue: None, normal,Extremity Movements Upper (arms, wrists, hands, fingers): None, normal Lower (legs, knees, ankles, toes): None, normal, Trunk Movements Neck, shoulders, hips: None, normal, Overall Severity Severity of abnormal movements (highest score from questions above): None, normal Incapacitation due to  abnormal movements: None, normal Patient's awareness of abnormal movements (rate only patient's report): No Awareness, Dental Status Current problems with teeth and/or dentures?: No Does patient usually wear dentures?: No  CIWA:  CIWA-Ar Total: 1 COWS:  COWS Total Score: 3  Treatment Plan Summary: Daily contact with patient to assess and evaluate symptoms and progress in treatment Medication management  Plan: Increase Buspar 10 mg BId Increase Cymbalta 30 mg QD Treatment Plan/Recommendations:  1. Admit for crisis management and stabilization. 2. Medication management to reduce current symptoms to base line and improve the patient's overall level of functioning. 3. Treat health problems as indicated. 4. Develop treatment plan to decrease risk of relapse upon discharge and to reduce the need for readmission. 5. Psycho-social education regarding relapse prevention and self care. 6. Health care follow up as needed for medical problems. 7. Restart home medications where appropriate.   Medical Decision Making Problem Points:  Established problem, worsening (2), Review of last therapy session (1) and Review of psycho-social stressors (1) Data Points:  Review or order clinical lab tests (1) Review or order medicine tests (1) Review of medication regiment & side effects (2) Review of new medications or change in dosage (2)  I certify that inpatient services furnished can reasonably be expected to improve the patient's condition.   Nehemiah Settle., MD 07/25/2013, 12:58 PM

## 2013-07-25 NOTE — Progress Notes (Signed)
Patient came to medication window and c/o headache she rated a 6. Patient received 2 tylenol for headache. Patient requested her scheduled medications at the same time reporting that she was tired and wanted to get some rest. Patient voiced no other complaints. She attended group this evening and participated. Support and encouragement given, Clinical research associate will monitor effectiveness of meds given. Patient denies si/hi/a/v hallucinations. Safety maintained with 15 min checks, will continue to monitor.

## 2013-07-25 NOTE — Progress Notes (Signed)
Date: 07/25/2013  Time: 9:11 PM  Group Topic/Focus:  Wrap-Up Group: The focus of this group is to help patients review their daily goal of treatment and discuss progress on daily workbooks.  Participation Level: Active  Participation Quality: Appropriate, Sharing and Supportive  Affect: Appropriate  Cognitive: Appropriate  Insight: Appropriate  Engagement in Group: Engaged and Supportive  Modes of Intervention: Discussion, Education and Support  Additional Comments: pt attend group, pt is working on getting on the right medication working on depression and going to out patient therapy.  Isla Pence M  07/25/2013, 9:11 PM

## 2013-07-25 NOTE — Tx Team (Signed)
Interdisciplinary Treatment Plan Update   Date Reviewed:  07/25/2013  Time Reviewed:  10:18 AM  Progress in Treatment:   Attending groups: Yes Participating in groups: Yes Taking medication as prescribed: Yes  Tolerating medication: Yes Family/Significant other contact made: Yes, contact made with her husband  Patient understands diagnosis: Yes  Discussing patient identified problems/goals with staff: Yes Medical problems stabilized or resolved: Yes Denies suicidal/homicidal ideation: Yes Patient has not harmed self or others: Yes  For review of initial/current patient goals, please see plan of care.  Estimated Length of Stay:  1-3 days  Reasons for Continued Hospitalization:  Anxiety Depression Medication stabilization  New Problems/Goals identified:    Discharge Plan or Barriers:   Home with outpatient follow up [place]  Additional Comments: N/A  Attendees:  Patient:  07/25/2013 10:18 AM   Signature: Mervyn Gay, MD 07/25/2013 10:18 AM  Signature:  Verne Spurr, PA 07/25/2013 10:18 AM  Signature: Harold Barban, RN 07/25/2013 10:18 AM  Signature:Janet Hyman Hopes, RN 07/25/2013 10:18 AM  Signature:   07/25/2013 10:18 AM  Signature:  Juline Patch, LCSW 07/25/2013 10:18 AM  Signature:  Reyes Ivan, LCSW 07/25/2013 10:18 AM  Signature:  Maseta Dorley,Care Coordinator 07/25/2013 10:18 AM  Signature: 07/25/2013 10:18 AM  Signature:    Signature:    Signature:      Scribe for Treatment Team:   Juline Patch,  07/25/2013 10:18 AM

## 2013-07-25 NOTE — [facility] Group Notes (Signed)
[facility] LCSW Group Therapy  Feelings Around Relapse 1:15 -2:30        07/25/2013     Type of Therapy:  Group Therapy  Participation Level:  Appropriate  Participation Quality:  Appropriate  Affect:  Appropriate  Cognitive:  Attentive Appropriate  Insight:  Developing/Improving  Engagement in Therapy: Developing/Improving  Modes of Intervention:  Discussion Exploration Problem-Solving Supportive  Summary of Progress/Problems:  The topic for today was feelings around relapse.    Patient processed feelings toward relapse and was able to relate to peers.  She shared she spends too much time alone.  She shared she plans to spend more time with her sister and go to a movie.   Patient identified coping skills that can be used to prevent a relapse.   Wynn Banker 07/25/2013

## 2013-07-25 NOTE — Progress Notes (Addendum)
Pt states she does not feel good this am. C/O vomitting times 4 this am. MD aware and pt was given zofran. Pt refused to take meds as scheduled will take when she feels better. Pt missed the first group secondary to not feeling good. No SI or HI and contracts for safety. Pt states her energy level is very low today. Pt states she has not been sleeping good here. Pt does plan to see a therapist once discharged form [facility]. Pt stated she feels much better from the zofran and did go outside with the other pts. Pt has been drinking gatarode and has not had anymore episodes of N/V. 6pm -family came to visit pt. Pt requested 4mg  of zofran for nausea. Given this.

## 2013-07-25 NOTE — Progress Notes (Signed)
Patient ID: Margaret Wood, female   DOB: 1961/11/01, 52 y.o.   MRN: [id number]

## 2013-07-26 ENCOUNTER — Emergency Department (HOSPITAL_COMMUNITY)
Admission: EM | Admit: 2013-07-26 | Discharge: 2013-07-26 | Disposition: A | Discharge status: Other Acute Inpatient Hospital | Payer: PRIVATE HEALTH INSURANCE | Attending: Emergency Medicine | Admitting: Emergency Medicine

## 2013-07-26 ENCOUNTER — Encounter (HOSPITAL_COMMUNITY): Payer: Self-pay | Admitting: Emergency Medicine

## 2013-07-26 DIAGNOSIS — Z79899 Other long term (current) drug therapy: Secondary | ICD-10-CM | POA: Insufficient documentation

## 2013-07-26 DIAGNOSIS — F332 Major depressive disorder, recurrent severe without psychotic features: Secondary | ICD-10-CM | POA: Insufficient documentation

## 2013-07-26 DIAGNOSIS — IMO0001 Reserved for inherently not codable concepts without codable children: Secondary | ICD-10-CM | POA: Insufficient documentation

## 2013-07-26 DIAGNOSIS — F411 Generalized anxiety disorder: Secondary | ICD-10-CM | POA: Insufficient documentation

## 2013-07-26 DIAGNOSIS — R45851 Suicidal ideations: Secondary | ICD-10-CM | POA: Insufficient documentation

## 2013-07-26 LAB — POCT I-STAT, CHEM 8
Chloride: 107 mEq/L (ref 96–112)
HCT: 42 % (ref 36.0–46.0)
Hemoglobin: 14.3 g/dL (ref 12.0–15.0)
Potassium: 3.4 mEq/L — ABNORMAL LOW (ref 3.5–5.1)
Sodium: 138 mEq/L (ref 135–145)

## 2013-07-26 LAB — CBC WITH DIFFERENTIAL/PLATELET
Eosinophils Absolute: 0 10*3/uL (ref 0.0–0.7)
Eosinophils Relative: 0 % (ref 0–5)
Lymphs Abs: 1.2 10*3/uL (ref 0.7–4.0)
MCH: 30.2 pg (ref 26.0–34.0)
MCV: 87.6 fL (ref 78.0–100.0)
Platelets: 254 10*3/uL (ref 150–400)
RBC: 4.5 MIL/uL (ref 3.87–5.11)

## 2013-07-26 LAB — URINALYSIS, ROUTINE W REFLEX MICROSCOPIC
Bilirubin Urine: NEGATIVE
Glucose, UA: NEGATIVE mg/dL
Ketones, ur: 15 mg/dL — AB
Nitrite: NEGATIVE
Protein, ur: NEGATIVE mg/dL
Urobilinogen, UA: 0.2 mg/dL (ref 0.0–1.0)

## 2013-07-26 LAB — URINE MICROSCOPIC-ADD ON

## 2013-07-26 LAB — HEPATIC FUNCTION PANEL
ALT: 8 U/L (ref 0–35)
AST: 19 U/L (ref 0–37)
Albumin: 4.1 g/dL (ref 3.5–5.2)
Bilirubin, Direct: 0.2 mg/dL (ref 0.0–0.3)
Total Protein: 7.8 g/dL (ref 6.0–8.3)

## 2013-07-26 MED ORDER — DOXYCYCLINE HYCLATE 100 MG PO CAPS: 100.0000 mg | ORAL_CAPSULE | Freq: Two times a day (BID) | ORAL | Status: DC

## 2013-07-26 MED ORDER — PANTOPRAZOLE SODIUM 40 MG PO TBEC: 40.0000 mg | DELAYED_RELEASE_TABLET | ORAL | Status: DC | PRN

## 2013-07-26 MED ORDER — ACETAMINOPHEN 325 MG PO TABS
650.0000 mg | ORAL_TABLET | Freq: Once | ORAL | Status: AC
  Administered 2013-07-26: 650 mg via ORAL

## 2013-07-26 MED ORDER — DICYCLOMINE HCL 10 MG PO CAPS: 10.0000 mg | ORAL_CAPSULE | Freq: Four times a day (QID) | ORAL | Status: DC | PRN

## 2013-07-26 MED ORDER — ONDANSETRON HCL 4 MG/2ML IJ SOLN
4.0000 mg | Freq: Once | INTRAMUSCULAR | Status: AC
  Administered 2013-07-26: 4 mg via INTRAVENOUS
  Filled 2013-07-26: qty 2

## 2013-07-26 MED ORDER — ONDANSETRON HCL 4 MG PO TABS: 4.0000 mg | ORAL_TABLET | Freq: Three times a day (TID) | ORAL | Status: DC | PRN

## 2013-07-26 MED ORDER — TRAZODONE HCL 50 MG PO TABS: 50.0000 mg | ORAL_TABLET | Freq: Every evening | ORAL | Status: DC | PRN

## 2013-07-26 MED ORDER — SODIUM CHLORIDE 0.9 % IV BOLUS (SEPSIS)
1000.0000 mL | Freq: Once | INTRAVENOUS | Status: AC
  Administered 2013-07-26: 1000 mL via INTRAVENOUS

## 2013-07-26 MED ORDER — BUSPIRONE HCL 10 MG PO TABS: 10.0000 mg | ORAL_TABLET | Freq: Two times a day (BID) | ORAL | Status: DC

## 2013-07-26 MED ORDER — DULOXETINE HCL 30 MG PO CPEP: 30.0000 mg | ORAL_CAPSULE | Freq: Every day | ORAL | Status: DC

## 2013-07-26 MED ORDER — PROMETHAZINE HCL 25 MG PO TABS
25.0000 mg | ORAL_TABLET | Freq: Once | ORAL | Status: AC
  Administered 2013-07-26: 25 mg via ORAL
  Filled 2013-07-26: qty 1

## 2013-07-26 NOTE — Progress Notes (Signed)
Patient ID: Margaret Wood, female   DOB: 03/10/1961, 52 y.o.   MRN: [id number] Psychoeducational Group Note  Date:  07/26/2013 Time:1100am  Group Topic/Focus:  Identifying Needs:   The focus of this group is to help patients identify their personal needs that have been historically problematic and identify healthy behaviors to address their needs.  Participation Level:  Did Not Attend  Participation Quality:    Affect: Cognitive:   Insight:Engagement in Group:   Additional Comments:  Psychoeducational group-life skills   Valente David 07/26/2013,12:39 PM

## 2013-07-26 NOTE — ED Notes (Signed)
Notified RN. Margaret Wood, pt. i-stat Chem 8 results potassium 3.4.

## 2013-07-26 NOTE — Progress Notes (Signed)
Writer spoke with patient and she reports that she feels great compared to last night. She reports her husband and children came to visit on today. Patient has voiced no complaints, preparing to attend wrap up group. Support and encouragement offered, safety maintained with 15 min checks.

## 2013-07-26 NOTE — [facility] Group Notes (Signed)
[facility] Group Notes:  (Clinical Social Work)  07/26/2013   3:00-4:00PM  Summary of Progress/Problems:   The main focus of today's process group was for the patient to identify ways in which they have sabotaged their own mental health wellness/recovery.  Motivational interviewing was used to explore the reasons they engage in this behavior, and reasons they may have for wanting to change.  The Stages of Change were explained to the group using a handout, and patients identified where they are with regard to changing self-defeating behaviors.  The patient expressed that she has been married 27 years, and that she self-sabotages by assuming that her husband knows what she is thinking about, and what is wrong.  The patient was in and out of the room several times needing the restroom, and missed the discussion about stages of change.  Type of Therapy:  Process Group  Participation Level:  Active  Participation Quality:  Attentive  Affect:  Anxious and Blunted  Cognitive:  Oriented  Insight:  Developing/Improving  Engagement in Therapy:  Developing  Modes of Intervention:  Education, Motivational Interviewing   Ambrose Mantle, LCSW 07/26/2013, 4:55 PM

## 2013-07-26 NOTE — ED Notes (Signed)
Awaiting security for transport.

## 2013-07-26 NOTE — ED Notes (Addendum)
Pt. Transferred from [facility] with complaint of Nausea, vomiting and headache which started in the morning of yesterday. Denies diarrhea. Pt. Has sitter for SI. Pt. Reported of  3 ticks that were found on her body after being in woods   Prior to admission at [facility].

## 2013-07-26 NOTE — ED Notes (Signed)
Patient having emesis

## 2013-07-26 NOTE — ED Notes (Signed)
Patient continues to complain of nausea. Will hold medications until phenergan given

## 2013-07-26 NOTE — Progress Notes (Signed)
[facility] MD Progress Note  07/26/2013 2:05 PM Margaret Wood  MRN:  [id number] Subjective:  Patient resended her 72 hour discharge papers because of her physical issues.  She is feeling better mentally but physically feeling poorly--she had found ticks on her abdomen and back and was sent to the ED early this am.  Antibiotics started and patient currently lying in her bed. Diagnosis:   DSM5:  Depressive Disorders:  Major Depressive Disorder - Severe (296.23)  Axis I: Anxiety Disorder NOS and Major Depression, Recurrent severe Axis II: Deferred Axis III:  Past Medical History  Diagnosis Date  . Palpitations   . Fibromyalgia   . Scoliosis   . Arthritis   . Depression    Axis IV: other psychosocial or environmental problems, problems related to social environment and problems with primary support group Axis V: 41-50 serious symptoms  ADL's:  Intact  Sleep: Fair  Appetite:  Poor  Suicidal Ideation:  Denies Homicidal Ideation:  Denies  Psychiatric Specialty Exam: Review of Systems  Constitutional: Positive for malaise/fatigue.  HENT: Negative.   Eyes: Negative.   Respiratory: Negative.   Cardiovascular: Negative.   Gastrointestinal: Positive for nausea.  Genitourinary: Negative.   Musculoskeletal: Negative.   Skin: Negative.   Neurological: Negative.   Endo/Heme/Allergies: Negative.   Psychiatric/Behavioral: Positive for depression. The patient is nervous/anxious.     Blood pressure 133/66, pulse 112, temperature 98.1 F (36.7 C), temperature source Oral, resp. rate 20, height 5\' 2"  (1.575 m), weight 86.637 kg (191 lb), last menstrual period 06/17/2013, SpO2 97.00%.Body mass index is 34.93 kg/(m^2).  General Appearance: Casual  Eye Contact::  Fair  Speech:  Normal Rate  Volume:  Normal  Mood:  Anxious and Depressed  Affect:  Congruent  Thought Process:  Coherent  Orientation:  Full (Time, Place, and Person)  Thought Content:  WDL  Suicidal Thoughts:  No   Homicidal Thoughts:  No  Memory:  Immediate;   Fair Recent;   Fair Remote;   Fair  Judgement:  Impaired  Insight:  Fair  Psychomotor Activity:  Decreased  Concentration:  Fair  Recall:  Fair  Akathisia:  No  Handed:  Right  AIMS (if indicated):     Assets:  Communication Skills Physical Health Resilience Social Support  Sleep:  Number of Hours: 2.25   Current Medications: Current Facility-Administered Medications  Medication Dose Route Frequency Provider Last Rate Last Dose  . acetaminophen (TYLENOL) tablet 650 mg  650 mg Oral Q6H PRN Nelly Rout, MD   650 mg at 07/25/13 2057  . alum & mag hydroxide-simeth (MAALOX/MYLANTA) 200-200-20 MG/5ML suspension 30 mL  30 mL Oral Q4H PRN Nelly Rout, MD      . busPIRone (BUSPAR) tablet 10 mg  10 mg Oral BID Nehemiah Settle, MD   10 mg at 07/26/13 1346  . dicyclomine (BENTYL) capsule 10 mg  10 mg Oral Q6H PRN Nelly Rout, MD      . diphenhydrAMINE (BENADRYL) capsule 25 mg  25 mg Oral Q6H PRN Nehemiah Settle, MD   25 mg at 07/24/13 2100  . DULoxetine (CYMBALTA) DR capsule 30 mg  30 mg Oral Daily Nehemiah Settle, MD   30 mg at 07/26/13 1346  . estrogens (conjugated) (PREMARIN) tablet 0.625 mg  0.625 mg Oral Daily Nelly Rout, MD   0.625 mg at 07/26/13 1347  . magnesium hydroxide (MILK OF MAGNESIA) suspension 30 mL  30 mL Oral Daily PRN Nelly Rout, MD      .  metoprolol (LOPRESSOR) tablet 25 mg  25 mg Oral BID PRN Nelly Rout, MD      . ondansetron [facility]) tablet 4 mg  4 mg Oral Q8H PRN Nehemiah Settle, MD   4 mg at 07/26/13 0319  . pantoprazole (PROTONIX) EC tablet 40 mg  40 mg Oral Daily Nelly Rout, MD   40 mg at 07/26/13 1346  . progesterone (PROMETRIUM) capsule 100 mg  100 mg Oral QHS Nelly Rout, MD   100 mg at 07/25/13 2056  . traZODone (DESYREL) tablet 50 mg  50 mg Oral QHS,MR X 1 Kerry Hough, PA-C   50 mg at 07/25/13 2057    Lab Results:  Results for orders placed during  the hospital encounter of 07/23/13 (from the past 48 hour(s))  CBC WITH DIFFERENTIAL     Status: Abnormal   Collection Time    07/26/13  6:49 AM      Result Value Range   WBC 9.6  4.0 - 10.5 K/uL   RBC 4.50  3.87 - 5.11 MIL/uL   Hemoglobin 13.6  12.0 - 15.0 g/dL   HCT 45.4  09.8 - 11.9 %   MCV 87.6  78.0 - 100.0 fL   MCH 30.2  26.0 - 34.0 pg   MCHC 34.5  30.0 - 36.0 g/dL   RDW 14.7  82.9 - 56.2 %   Platelets 254  150 - 400 K/uL   Neutrophils Relative % 83 (*) 43 - 77 %   Neutro Abs 7.9 (*) 1.7 - 7.7 K/uL   Lymphocytes Relative 12  12 - 46 %   Lymphs Abs 1.2  0.7 - 4.0 K/uL   Monocytes Relative 4  3 - 12 %   Monocytes Absolute 0.4  0.1 - 1.0 K/uL   Eosinophils Relative 0  0 - 5 %   Eosinophils Absolute 0.0  0.0 - 0.7 K/uL   Basophils Relative 0  0 - 1 %   Basophils Absolute 0.0  0.0 - 0.1 K/uL  HEPATIC FUNCTION PANEL     Status: Abnormal   Collection Time    07/26/13  6:49 AM      Result Value Range   Total Protein 7.8  6.0 - 8.3 g/dL   Albumin 4.1  3.5 - 5.2 g/dL   AST 19  0 - 37 U/L   ALT 8  0 - 35 U/L   Alkaline Phosphatase 56  39 - 117 U/L   Total Bilirubin 1.6 (*) 0.3 - 1.2 mg/dL   Bilirubin, Direct 0.2  0.0 - 0.3 mg/dL   Indirect Bilirubin 1.4 (*) 0.3 - 0.9 mg/dL  LIPASE, BLOOD     Status: None   Collection Time    07/26/13  6:49 AM      Result Value Range   Lipase 34  11 - 59 U/L  POCT I-STAT, CHEM 8     Status: Abnormal   Collection Time    07/26/13  6:57 AM      Result Value Range   Sodium 138  135 - 145 mEq/L   Potassium 3.4 (*) 3.5 - 5.1 mEq/L   Chloride 107  96 - 112 mEq/L   BUN 7  6 - 23 mg/dL   Creatinine, Ser 1.30  0.50 - 1.10 mg/dL   Glucose, Bld 865 (*) 70 - 99 mg/dL   Calcium, Ion 7.84 (*) 1.12 - 1.23 mmol/L   TCO2 21  0 - 100 mmol/L   Hemoglobin 14.3  12.0 -  15.0 g/dL   HCT 16.1  09.6 - 04.5 %  URINALYSIS, ROUTINE W REFLEX MICROSCOPIC     Status: Abnormal   Collection Time    07/26/13  7:02 AM      Result Value Range   Color, Urine YELLOW   YELLOW   APPearance CLEAR  CLEAR   Specific Gravity, Urine 1.012  1.005 - 1.030   pH 7.0  5.0 - 8.0   Glucose, UA NEGATIVE  NEGATIVE mg/dL   Hgb urine dipstick TRACE (*) NEGATIVE   Bilirubin Urine NEGATIVE  NEGATIVE   Ketones, ur 15 (*) NEGATIVE mg/dL   Protein, ur NEGATIVE  NEGATIVE mg/dL   Urobilinogen, UA 0.2  0.0 - 1.0 mg/dL   Nitrite NEGATIVE  NEGATIVE   Leukocytes, UA NEGATIVE  NEGATIVE  URINE MICROSCOPIC-ADD ON     Status: Abnormal   Collection Time    07/26/13  7:02 AM      Result Value Range   Squamous Epithelial / LPF MANY (*) RARE   RBC / HPF 0-2  <3 RBC/hpf    Physical Findings: AIMS: Facial and Oral Movements Muscles of Facial Expression: None, normal Lips and Perioral Area: None, normal Jaw: None, normal Tongue: None, normal,Extremity Movements Upper (arms, wrists, hands, fingers): None, normal Lower (legs, knees, ankles, toes): None, normal, Trunk Movements Neck, shoulders, hips: None, normal, Overall Severity Severity of abnormal movements (highest score from questions above): None, normal Incapacitation due to abnormal movements: None, normal Patient's awareness of abnormal movements (rate only patient's report): No Awareness, Dental Status Current problems with teeth and/or dentures?: No Does patient usually wear dentures?: No  CIWA:  CIWA-Ar Total: 1 COWS:  COWS Total Score: 3  Treatment Plan Summary: Daily contact with patient to assess and evaluate symptoms and progress in treatment Medication management  Plan:  Review of chart, vital signs, medications, and notes. 1-Individual and group therapy 2-Medication management for depression and anxiety:  Medications reviewed with the patient and changes made in the ED this am 3-Coping skills for depression and anxiety 4-Continue crisis stabilization and management 5-Address health issues--monitoring vital signs, stable 6-Treatment plan in progress to prevent relapse of depression and anxiety  Medical  Decision Making Problem Points:  Established problem, stable/improving (1) and Review of psycho-social stressors (1) Data Points:  Review of new medications or change in dosage (2)  I certify that inpatient services furnished can reasonably be expected to improve the patient's condition.   Nanine Means, PMH-NP 07/26/2013, 2:05 PM

## 2013-07-26 NOTE — Progress Notes (Signed)
Patient ID: Margaret Wood, female   DOB: 01-25-1961, 52 y.o.   MRN: [id number] S-requested to evaluate bugs pt got off her inframammary area on the rt.Pt has hx of lying in woods after attempting OD.She has c/o of nausea /malaise in addition to irritability.She has been tachycardic since admission.  O- brown 2 mm roundish appearing bugs able to crawl in specimen cup.Under microscope there is the typical appendage of a tic .The central body is filled with red blood  A-Tic infestation-seed dog vs deer  P-Referred to ED for total body exam and tic removal as indicated.Will order labs here.Treatment per ED or attending.

## 2013-07-26 NOTE — ED Provider Notes (Signed)
CSN: [id number]     Arrival date & time 07/26/13  0609 History   First MD Initiated Contact with Patient 07/26/13 [phone]     Chief Complaint  Patient presents with  . Nausea  . Emesis  . Suicidal   (Consider location/radiation/quality/duration/timing/severity/associated sxs/prior Treatment) HPI Comments: 52 year old female who is currently in  [facility] for an overdose and suicidal attempt presents with nausea vomiting and headache. [place] and states she's taken off 3-5 ticks the last 2 days. She feels generally weak but has no focal weakness. She has not had any fevers. She also is complaining of right upper quadrant abdominal pain that is chronic for her. States she's had problems with her gallbladder. Denies any neck pain or neck stiffness. Denies any urinary symptoms.  The history is provided by the patient.    Past Medical History  Diagnosis Date  . Palpitations   . Fibromyalgia   . Scoliosis   . Arthritis   . Depression    Past Surgical History  Procedure Laterality Date  . Appendectomy  1980  . Cesarean section  1995  . Cholecystectomy  1997   History reviewed. No pertinent family history. History  Substance Use Topics  . Smoking status: Never Smoker   . Smokeless tobacco: Not on file  . Alcohol Use: No   OB History   Grav Para Term Preterm Abortions TAB SAB Ect Mult Living                 Review of Systems  Constitutional: Negative for fever and chills.  HENT: Negative for neck pain and neck stiffness.   Gastrointestinal: Positive for nausea, vomiting and abdominal pain.  Genitourinary: Negative for dysuria.  Skin: Positive for rash (has noticed small spots wheres she picked off the ticks).  Neurological: Positive for weakness (generalized) and headaches. Negative for numbness.  All other systems reviewed and are negative.    Allergies  Amoxicillin; Nsaids; and Tegretol  Home Medications   Current Outpatient Rx  Name  Route  Sig  Dispense   Refill  . acetaminophen (TYLENOL) 500 MG tablet   Oral   Take 500 mg by mouth every 6 (six) hours as needed.           . ALPRAZolam (XANAX) 0.25 MG tablet   Oral   Take 0.25 mg by mouth at bedtime as needed for sleep.         . cyclobenzaprine (FLEXERIL) 10 MG tablet   Oral   Take 10 mg by mouth at bedtime. 1 tablet by mouth at bedtime.         . dicyclomine (BENTYL) 10 MG capsule   Oral   Take 10-20 mg by mouth 4 (four) times daily. 1-2 tablets four times daily         . estrogens, conjugated, (PREMARIN) 0.625 MG tablet   Oral   Take 0.625 mg by mouth daily. Take daily for 21 days then do not take for 7 days.         . pantoprazole (PROTONIX) 40 MG tablet   Oral   Take 40 mg by mouth as needed.         . progesterone (PROMETRIUM) 100 MG capsule   Oral   Take 100 mg by mouth at bedtime. 1 tablet by mouth nightly         . traMADol (ULTRAM) 50 MG tablet   Oral   Take 50 mg by mouth 2 (two) times  daily as needed for pain.         . metoprolol tartrate (LOPRESSOR) 25 MG tablet   Oral   Take 25 mg by mouth 2 (two) times daily as needed (arrythmia).           BP 142/102  Pulse 115  Temp(Src) 98.4 F (36.9 C) (Oral)  Resp 20  Ht 5\' 2"  (1.575 m)  Wt 191 lb (86.637 kg)  BMI 34.93 kg/m2  SpO2 95%  LMP 06/17/2013 Physical Exam  Vitals reviewed. Constitutional: She is oriented to person, place, and time. She appears well-developed and well-nourished.  HENT:  Head: Normocephalic and atraumatic.  Right Ear: External ear normal.  Left Ear: External ear normal.  Nose: Nose normal.  Eyes: EOM are normal. Pupils are equal, round, and reactive to light. Right eye exhibits no discharge. Left eye exhibits no discharge.  Neck: Normal range of motion. Neck supple.  Cardiovascular: Normal rate, regular rhythm and normal heart sounds.   Pulmonary/Chest: Effort normal and breath sounds normal.  Abdominal: Soft. There is tenderness (patient very dramatic on exam)  in the right upper quadrant.  Neurological: She is alert and oriented to person, place, and time. She has normal strength. No cranial nerve deficit or sensory deficit. GCS eye subscore is 4. GCS verbal subscore is 5. GCS motor subscore is 6.  Skin: Skin is warm and dry.  Small erythematous spots c/w insect bites on RLQ and lower extremities. No fine rash, petechiae or other systemic rash.    ED Course  Procedures (including critical care time) Labs Review Labs Reviewed  CBC WITH DIFFERENTIAL - Abnormal; Notable for the following:    Neutrophils Relative % 83 (*)    Neutro Abs 7.9 (*)    All other components within normal limits  URINALYSIS, ROUTINE W REFLEX MICROSCOPIC - Abnormal; Notable for the following:    Hgb urine dipstick TRACE (*)    Ketones, ur 15 (*)    All other components within normal limits  HEPATIC FUNCTION PANEL - Abnormal; Notable for the following:    Total Bilirubin 1.6 (*)    Indirect Bilirubin 1.4 (*)    All other components within normal limits  URINE MICROSCOPIC-ADD ON - Abnormal; Notable for the following:    Squamous Epithelial / LPF MANY (*)    All other components within normal limits  POCT I-STAT, CHEM 8 - Abnormal; Notable for the following:    Potassium 3.4 (*)    Glucose, Bld 114 (*)    Calcium, Ion 1.01 (*)    All other components within normal limits  LIPASE, BLOOD  B. BURGDORFI ANTIBODIES  ROCKY MTN SPOTTED FVR AB, IGG-BLOOD  ROCKY MTN SPOTTED FVR AB, IGM-BLOOD   Imaging Review No results found.  MDM   1. MDD (major depressive disorder), recurrent severe, without psychosis   2. GAD (generalized anxiety disorder)   3. Nausea    Exam is benign, including normal neuro exam. There are no signs of meningismus. She was given fluids and antibiotics in the ED. Her labs are benign, including no hyponatremia or altered platelets. However given that she states she is pulled multiple takes often has a new headache we will treat with doxycycline for  possible RMSF. She is otherwise medically clear to return to behavioral health.    Audree Camel, MD 07/26/13 [phone]

## 2013-07-26 NOTE — Progress Notes (Signed)
D) Pt returning from the hospital. Continues to feel nauseated, but has not vomited. Went to sleep stating that "I cannot go to the groups.". Slept for a little while. Approached Pt with the fact her 72 hour form will be up this evening and she still is not well.  A) Encouraged Pt to resend her 72 hour form and to work the program until the Dr. Carlyon Prows she is able to function safely on her own. R) Pt rates her depression and hopelessness both at a 1 and denies SI and HI.

## 2013-07-27 LAB — GLUCOSE, CAPILLARY: Glucose-Capillary: 101 mg/dL — ABNORMAL HIGH (ref 70–99)

## 2013-07-27 MED ORDER — DOXYCYCLINE HYCLATE 100 MG PO TABS
100.0000 mg | ORAL_TABLET | Freq: Two times a day (BID) | ORAL | Status: DC
  Administered 2013-07-27 – 2013-07-28 (×2): 100 mg via ORAL
  Filled 2013-07-27: qty 6
  Filled 2013-07-27 (×2): qty 1
  Filled 2013-07-27: qty 6
  Filled 2013-07-27 (×2): qty 1

## 2013-07-27 NOTE — Progress Notes (Deleted)
D) Pt attending the groups and interacting with her peers appropriately. Denies SI and HI Rates her depression and hopelessness both at a 1. Pt discussed her issues prior to coming to the hospital with this Clinical research associate. Stated that she was overwhelmed at work and at home and really couldn't see a way out. Stated '"It was like all I could see was in front of me, I couldn't see or think anything else except I wanted to get rid of the pain". Pt. Talked about her job and that it is very stressful for her. States she often brings home issues and worries from her job. A) Provided Pt with a 1:1. Talked with Pt about choices and how to leave things that happen at work, at work and not taking it home with her. Given support, encouragement and praise. R) Pt denies SI and HI.

## 2013-07-27 NOTE — [facility] Group Notes (Signed)
Adult Psychoeducational Group Note  Date:  07/27/2013 Time:  9:57 PM  Group Topic/Focus:  Wrap-Up Group:   The focus of this group is to help patients review their daily goal of treatment and discuss progress on daily workbooks.  Participation Level:  Minimal  Participation Quality:  Appropriate  Affect:  Appropriate  Cognitive:  Appropriate  Insight: Appropriate  Engagement in Group:  Limited  Modes of Intervention:  Discussion  Additional Comments:  Margaret Wood stated that she attended all groups and visited with family.  Overall, she had a good day.  Caroll Rancher A 07/27/2013, 9:57 PM

## 2013-07-27 NOTE — [facility] Group Notes (Signed)
[facility] Group Notes:  (Clinical Social Work)  07/27/2013   3:00-4:00PM  Summary of Progress/Problems:   The main focus of today's process group was to   identify the patient's current support system and decide on other supports that can be put in place.  The picture on workbook was used to discuss why additional supports are needed, and a hand-out was distributed with four definitions/levels of support, then used to talk about how patients have given and received all different kinds of support.  An emphasis was placed on using counselor, doctor, therapy groups, 12-step groups, and problem-specific support groups to expand supports.  The patient identified one additional support as being interests and activities, such as learning to paint or taking a photography class.  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  Type of Therapy:  Process Group  Participation Level:  Active  Participation Quality:  Attentive and Sharing  Affect:  Blunted  Cognitive:  Appropriate and Oriented  Insight:  Engaged  Engagement in Therapy:  Engaged  Modes of Intervention:  Education,  Support and [employer], LCSW 07/27/2013, 5:06 PM

## 2013-07-27 NOTE — Progress Notes (Signed)
Writer spoke with patient at medication window and she reports having had a good day. Patient's husband and 2 sons had visited earlier and she reports feeling  much better. Patient currently denies si/hi/a/v hallucinations. Support and encouragement offered. Safety maintained with 15 min checks. Patient is compliant with meds. She reports looking forward to discharge.

## 2013-07-27 NOTE — Progress Notes (Signed)
[facility] MD Progress Note  07/27/2013 11:17 AM Margaret Wood  MRN:  [id number] Subjective:  Physical discomforts of vomiting and diarrhea have stopped and she "feels great."  She would like to go home tomorrow, she resended her  72 hour discharge form yesterday so the psychiatrist would not commit her.  Margaret Wood has a good support system of her husband and two teenage twin boys, visit frequently and would like to have her come home.  Margaret Wood denies depression and anxiety, more hopeful with sleep "real good" and appetite improving from stomach virus, denies suicidal/homicidal ideations and hallucinations. Diagnosis:   DSM5:  Depressive Disorders:  Major Depressive Disorder - Severe (296.23)  Axis I: Anxiety Disorder NOS and Major Depression, Recurrent severe Axis II: Deferred Axis III:  Past Medical History  Diagnosis Date  . Palpitations   . Fibromyalgia   . Scoliosis   . Arthritis   . Depression    Axis IV: other psychosocial or environmental problems, problems related to social environment and problems with primary support group Axis V: 41-50 serious symptoms  ADL's:  Intact  Sleep: Good  Appetite:  Fair  Suicidal Ideation:  Denies Homicidal Ideation:  Denies  Psychiatric Specialty Exam: Review of Systems  Constitutional: Negative.   HENT: Negative.   Eyes: Negative.   Respiratory: Negative.   Cardiovascular: Negative.   Gastrointestinal: Negative.   Genitourinary: Negative.   Musculoskeletal: Negative.   Skin: Negative.   Neurological: Negative.   Endo/Heme/Allergies: Negative.   Psychiatric/Behavioral: The patient is nervous/anxious.     Blood pressure 121/82, pulse 123, temperature 98.3 F (36.8 C), temperature source Oral, resp. rate 20, height 5\' 2"  (1.575 m), weight 86.637 kg (191 lb), last menstrual period 06/17/2013, SpO2 97.00%.Body mass index is 34.93 kg/(m^2).  General Appearance: Casual  Eye Contact::  Fair  Speech:  Normal Rate  Volume:  Normal   Mood:  Anxious  Affect:  Congruent  Thought Process:  Coherent  Orientation:  Full (Time, Place, and Person)  Thought Content:  WDL  Suicidal Thoughts:  No  Homicidal Thoughts:  No  Memory:  Immediate;   Fair Recent;   Fair Remote;   Fair  Judgement:  Fair  Insight:  Fair  Psychomotor Activity:  Normal  Concentration:  Fair  Recall:  Fair  Akathisia:  No  Handed:  Right  AIMS (if indicated):     Assets:  Physical Health Resilience Social Support  Sleep:  Number of Hours: 6.75   Current Medications: Current Facility-Administered Medications  Medication Dose Route Frequency Provider Last Rate Last Dose  . acetaminophen (TYLENOL) tablet 650 mg  650 mg Oral Q6H PRN Nelly Rout, MD   650 mg at 07/25/13 2057  . alum & mag hydroxide-simeth (MAALOX/MYLANTA) 200-200-20 MG/5ML suspension 30 mL  30 mL Oral Q4H PRN Nelly Rout, MD      . busPIRone (BUSPAR) tablet 10 mg  10 mg Oral BID Nehemiah Settle, MD   10 mg at 07/27/13 0815  . dicyclomine (BENTYL) capsule 10 mg  10 mg Oral Q6H PRN Nelly Rout, MD      . diphenhydrAMINE (BENADRYL) capsule 25 mg  25 mg Oral Q6H PRN Nehemiah Settle, MD   25 mg at 07/24/13 2100  . DULoxetine (CYMBALTA) DR capsule 30 mg  30 mg Oral Daily Nehemiah Settle, MD   30 mg at 07/27/13 0815  . estrogens (conjugated) (PREMARIN) tablet 0.625 mg  0.625 mg Oral Daily Nelly Rout, MD   0.625 mg at 07/27/13  0820  . magnesium hydroxide (MILK OF MAGNESIA) suspension 30 mL  30 mL Oral Daily PRN Nelly Rout, MD      . metoprolol (LOPRESSOR) tablet 25 mg  25 mg Oral BID PRN Nelly Rout, MD      . ondansetron [facility]) tablet 4 mg  4 mg Oral Q8H PRN Nehemiah Settle, MD   4 mg at 07/26/13 0319  . pantoprazole (PROTONIX) EC tablet 40 mg  40 mg Oral Daily Nelly Rout, MD   40 mg at 07/27/13 0815  . progesterone (PROMETRIUM) capsule 100 mg  100 mg Oral QHS Nelly Rout, MD   100 mg at 07/26/13 2051  . traZODone (DESYREL)  tablet 50 mg  50 mg Oral QHS,MR X 1 Kerry Hough, PA-C   50 mg at 07/26/13 2052    Lab Results:  Results for orders placed during the hospital encounter of 07/23/13 (from the past 48 hour(s))  CBC WITH DIFFERENTIAL     Status: Abnormal   Collection Time    07/26/13  6:49 AM      Result Value Range   WBC 9.6  4.0 - 10.5 K/uL   RBC 4.50  3.87 - 5.11 MIL/uL   Hemoglobin 13.6  12.0 - 15.0 g/dL   HCT 47.8  29.5 - 62.1 %   MCV 87.6  78.0 - 100.0 fL   MCH 30.2  26.0 - 34.0 pg   MCHC 34.5  30.0 - 36.0 g/dL   RDW 30.8  65.7 - 84.6 %   Platelets 254  150 - 400 K/uL   Neutrophils Relative % 83 (*) 43 - 77 %   Neutro Abs 7.9 (*) 1.7 - 7.7 K/uL   Lymphocytes Relative 12  12 - 46 %   Lymphs Abs 1.2  0.7 - 4.0 K/uL   Monocytes Relative 4  3 - 12 %   Monocytes Absolute 0.4  0.1 - 1.0 K/uL   Eosinophils Relative 0  0 - 5 %   Eosinophils Absolute 0.0  0.0 - 0.7 K/uL   Basophils Relative 0  0 - 1 %   Basophils Absolute 0.0  0.0 - 0.1 K/uL  HEPATIC FUNCTION PANEL     Status: Abnormal   Collection Time    07/26/13  6:49 AM      Result Value Range   Total Protein 7.8  6.0 - 8.3 g/dL   Albumin 4.1  3.5 - 5.2 g/dL   AST 19  0 - 37 U/L   ALT 8  0 - 35 U/L   Alkaline Phosphatase 56  39 - 117 U/L   Total Bilirubin 1.6 (*) 0.3 - 1.2 mg/dL   Bilirubin, Direct 0.2  0.0 - 0.3 mg/dL   Indirect Bilirubin 1.4 (*) 0.3 - 0.9 mg/dL  LIPASE, BLOOD     Status: None   Collection Time    07/26/13  6:49 AM      Result Value Range   Lipase 34  11 - 59 U/L  POCT I-STAT, CHEM 8     Status: Abnormal   Collection Time    07/26/13  6:57 AM      Result Value Range   Sodium 138  135 - 145 mEq/L   Potassium 3.4 (*) 3.5 - 5.1 mEq/L   Chloride 107  96 - 112 mEq/L   BUN 7  6 - 23 mg/dL   Creatinine, Ser 9.62  0.50 - 1.10 mg/dL   Glucose, Bld 952 (*) 70 - 99 mg/dL  Calcium, Ion 1.01 (*) 1.12 - 1.23 mmol/L   TCO2 21  0 - 100 mmol/L   Hemoglobin 14.3  12.0 - 15.0 g/dL   HCT 45.4  09.8 - 11.9 %  URINALYSIS,  ROUTINE W REFLEX MICROSCOPIC     Status: Abnormal   Collection Time    07/26/13  7:02 AM      Result Value Range   Color, Urine YELLOW  YELLOW   APPearance CLEAR  CLEAR   Specific Gravity, Urine 1.012  1.005 - 1.030   pH 7.0  5.0 - 8.0   Glucose, UA NEGATIVE  NEGATIVE mg/dL   Hgb urine dipstick TRACE (*) NEGATIVE   Bilirubin Urine NEGATIVE  NEGATIVE   Ketones, ur 15 (*) NEGATIVE mg/dL   Protein, ur NEGATIVE  NEGATIVE mg/dL   Urobilinogen, UA 0.2  0.0 - 1.0 mg/dL   Nitrite NEGATIVE  NEGATIVE   Leukocytes, UA NEGATIVE  NEGATIVE  URINE MICROSCOPIC-ADD ON     Status: Abnormal   Collection Time    07/26/13  7:02 AM      Result Value Range   Squamous Epithelial / LPF MANY (*) RARE   RBC / HPF 0-2  <3 RBC/hpf    Physical Findings: AIMS: Facial and Oral Movements Muscles of Facial Expression: None, normal Lips and Perioral Area: None, normal Jaw: None, normal Tongue: None, normal,Extremity Movements Upper (arms, wrists, hands, fingers): None, normal Lower (legs, knees, ankles, toes): None, normal, Trunk Movements Neck, shoulders, hips: None, normal, Overall Severity Severity of abnormal movements (highest score from questions above): None, normal Incapacitation due to abnormal movements: None, normal Patient's awareness of abnormal movements (rate only patient's report): No Awareness, Dental Status Current problems with teeth and/or dentures?: No Does patient usually wear dentures?: No  CIWA:  CIWA-Ar Total: 1 COWS:  COWS Total Score: 3  Treatment Plan Summary: Daily contact with patient to assess and evaluate symptoms and progress in treatment Medication management  Plan:  Review of chart, vital signs, medications, and notes. 1-Individual and group therapy 2-Medication management for depression and anxiety:  Medications reviewed with the patient and she stated no untoward effects, no changes made 3-Coping skills for depression and anxiety 4-Continue crisis stabilization and  management 5-Address health issues--monitoring vital signs 6-Treatment plan in progress to prevent relapse of depression and anxiety  Medical Decision Making Problem Points:  Established problem, stable/improving (1) and Review of psycho-social stressors (1) Data Points:  Review of medication regiment & side effects (2)  I certify that inpatient services furnished can reasonably be expected to improve the patient's condition.   Nanine Means, PMH-NP 07/27/2013, 11:17 AM

## 2013-07-27 NOTE — Progress Notes (Signed)
Patient ID: Margaret Wood, female   DOB: 1960-12-31, 52 y.o.   MRN: [id number] Psychoeducational Group Note  Date:  07/27/2013 Time:  1045am  Group Topic/Focus:  Making Healthy Choices:   The focus of this group is to help patients identify negative/unhealthy choices they were using prior to admission and identify positive/healthier coping strategies to replace them upon discharge.  Participation Level:  Active  Participation Quality:  Appropriate  Affect:  Appropriate  Cognitive:  Appropriate  Insight:  Supportive  Engagement in Group:  Supportive  Additional Comments:  Psychoeducational group - life skills   Valente David 07/27/2013,1:31 PM

## 2013-07-27 NOTE — Progress Notes (Signed)
Patient ID: Margaret Wood, female   DOB: [birth year], 52 y.o.   MRN: [id number] Psychoeducational Group Note  Date:  07/27/2013 Time:  0930am  Group Topic/Focus:  Making Healthy Choices:   The focus of this group is to help patients identify negative/unhealthy choices they were using prior to admission and identify positive/healthier coping strategies to replace them upon discharge.  Participation Level:  Active  Participation Quality:  Appropriate  Affect:  Appropriate  Cognitive:  Appropriate  Insight:  Supportive  Engagement in Group:  Supportive  Additional Comments:  Psychoeducational group-greatfulness  Valente David 07/27/2013,10:36 AM

## 2013-07-28 MED ORDER — BUSPIRONE HCL 10 MG PO TABS: 10.0000 mg | ORAL_TABLET | Freq: Two times a day (BID) | ORAL | Status: DC

## 2013-07-28 MED ORDER — DOXYCYCLINE HYCLATE 100 MG PO CAPS: 100.0000 mg | ORAL_CAPSULE | Freq: Two times a day (BID) | ORAL | Status: DC

## 2013-07-28 MED ORDER — PANTOPRAZOLE SODIUM 40 MG PO TBEC: 40.0000 mg | DELAYED_RELEASE_TABLET | ORAL | Status: DC | PRN

## 2013-07-28 MED ORDER — TRAZODONE HCL 50 MG PO TABS: 50.0000 mg | ORAL_TABLET | Freq: Every evening | ORAL | Status: DC | PRN

## 2013-07-28 MED ORDER — ESTROGENS CONJUGATED 0.625 MG PO TABS: 0.6250 mg | ORAL_TABLET | Freq: Every day | ORAL | Status: DC

## 2013-07-28 MED ORDER — DULOXETINE HCL 30 MG PO CPEP: 30.0000 mg | ORAL_CAPSULE | Freq: Every day | ORAL | Status: DC

## 2013-07-28 MED ORDER — PROGESTERONE MICRONIZED 100 MG PO CAPS: 100.0000 mg | ORAL_CAPSULE | Freq: Every day | ORAL | Status: DC

## 2013-07-28 MED ORDER — METOPROLOL TARTRATE 25 MG PO TABS
25.0000 mg | ORAL_TABLET | Freq: Two times a day (BID) | ORAL | Status: DC
  Filled 2013-07-28 (×2): qty 1

## 2013-07-28 NOTE — Progress Notes (Addendum)
Patient stated she felt nauseated after medications this morning.  Was given crackers, gingerale and zofran for nausea.  Manual pulse sitting 76.  D:  Patient's self inventory sheet, patient sleeps well, good appetite, normal energy level, good attention span.  Rated depression and hopelessness #1, denied anxiety.  Denied withdrawals.  Denied SI.  Worst pain #1, zero pain goal.  After discharge, plans to get more exercise, get out and do more activities.  No discharge plans.  No problems taking meds after discharge. A:  Medications administered per MD orders.  Emotional support and encouragement given patient. R:  Denied SI and HI.  Denied A/V hallucinations.  Denied pain.  Will continue to monitor patient for safety with 15 minute checks.  Safety maintained.  Patient adamant about leaving today.  MD talked with patient.

## 2013-07-28 NOTE — Progress Notes (Signed)
Adult Psychoeducational Group Note  Date:  07/28/2013 Time:  10:00am Group Topic/Focus:  Self Care:   The focus of this group is to help patients understand the importance of self-care in order to improve or restore emotional, physical, spiritual, interpersonal, and financial health.  Participation Level:  Active  Participation Quality:  Appropriate and Attentive  Affect:  Appropriate  Cognitive:  Alert and Appropriate  Insight: Appropriate and Good  Engagement in Group:  Engaged  Modes of Intervention:  Confrontation and Discussion  Additional Comments:  Pt attended and participated in group. When as what self care meant to her pt stated not being afraid to put your own needs first.  Pryor Curia 07/28/2013, 12:56 PM

## 2013-07-28 NOTE — Discharge Summary (Signed)
Physician Discharge Summary Note  Patient:  Margaret Wood is an 52 y.o., female MRN:  [id number] DOB:  [birth year] Patient phone:  [phone] (home)  Patient address:   [address] [place] [address],   Date of Admission:  07/23/2013 Date of Discharge: 07/28/2013   Reason for Admission:  Suicide attempt  Discharge Diagnoses: Principal Problem:   MDD (major depressive disorder), recurrent severe, without psychosis Active Problems:   GAD (generalized anxiety disorder)   Suicidal ideation  ROS Axis Diagnosis:  AXIS I: Generalized Anxiety Disorder and Major Depression, Recurrent severe  AXIS II: Deferred  AXIS III:  Past Medical History   Diagnosis  Date   .  Palpitations    .  Fibromyalgia    .  Scoliosis    .  Arthritis    .  Depression     AXIS IV: economic problems, other psychosocial or environmental problems, problems related to social environment and problems with primary support group  AXIS V: 41-50 serious symptoms   Level of Care:  OP  Hospital Course:  Patient admitted voluntarily and emergently from  Emergency department for the diagnosis of major depressive disorder anxiety and suicide attempt with overdose on her medication. patient reported she has been feeling overwhelmed anxiety and has taken intentionally overdose on her medication approximately 20 Xanax 0.5, 4 benadryl, and 4 tramadol with the intention to kill herself. She says she left a note after ingestion and walked into woods.       She was admitted to [facility] and evaluated upon arrival at the unit. Her symptoms were identified and medication management was initiated. She was encouraged to participate in unit programming and attended groups consistently. Valicia responded well to medication management and a supportive therapeutic environment. She did not need 1:1 observation or was a flight risk.       By the day of discharge she was in much improved condition, reported no SI/HI or AVH. Her  admission labs were reviewed and were unremarkable. She was felt stable to discharge home with plans to follow up as noted below.  Consults:  None  Significant Diagnostic Studies:  None  Discharge Vitals:   Blood pressure 135/90, pulse 132, temperature 98.3 F (36.8 C), temperature source Oral, resp. rate 16, height 5\' 2"  (1.575 m), weight 86.637 kg (191 lb), last menstrual period 06/17/2013, SpO2 97.00%. Body mass index is 34.93 kg/(m^2). Lab Results:   Results for orders placed during the hospital encounter of 07/23/13 (from the past 72 hour(s))  CBC WITH DIFFERENTIAL     Status: Abnormal   Collection Time    07/26/13  6:49 AM      Result Value Range   WBC 9.6  4.0 - 10.5 K/uL   RBC 4.50  3.87 - 5.11 MIL/uL   Hemoglobin 13.6  12.0 - 15.0 g/dL   HCT 21.3  08.6 - 57.8 %   MCV 87.6  78.0 - 100.0 fL   MCH 30.2  26.0 - 34.0 pg   MCHC 34.5  30.0 - 36.0 g/dL   RDW 46.9  62.9 - 52.8 %   Platelets 254  150 - 400 K/uL   Neutrophils Relative % 83 (*) 43 - 77 %   Neutro Abs 7.9 (*) 1.7 - 7.7 K/uL   Lymphocytes Relative 12  12 - 46 %   Lymphs Abs 1.2  0.7 - 4.0 K/uL   Monocytes Relative 4  3 - 12 %   Monocytes Absolute 0.4  0.1 -  1.0 K/uL   Eosinophils Relative 0  0 - 5 %   Eosinophils Absolute 0.0  0.0 - 0.7 K/uL   Basophils Relative 0  0 - 1 %   Basophils Absolute 0.0  0.0 - 0.1 K/uL  HEPATIC FUNCTION PANEL     Status: Abnormal   Collection Time    07/26/13  6:49 AM      Result Value Range   Total Protein 7.8  6.0 - 8.3 g/dL   Albumin 4.1  3.5 - 5.2 g/dL   AST 19  0 - 37 U/L   ALT 8  0 - 35 U/L   Alkaline Phosphatase 56  39 - 117 U/L   Total Bilirubin 1.6 (*) 0.3 - 1.2 mg/dL   Bilirubin, Direct 0.2  0.0 - 0.3 mg/dL   Indirect Bilirubin 1.4 (*) 0.3 - 0.9 mg/dL  LIPASE, BLOOD     Status: None   Collection Time    07/26/13  6:49 AM      Result Value Range   Lipase 34  11 - 59 U/L  POCT I-STAT, CHEM 8     Status: Abnormal   Collection Time    07/26/13  6:57 AM      Result  Value Range   Sodium 138  135 - 145 mEq/L   Potassium 3.4 (*) 3.5 - 5.1 mEq/L   Chloride 107  96 - 112 mEq/L   BUN 7  6 - 23 mg/dL   Creatinine, Ser 1.61  0.50 - 1.10 mg/dL   Glucose, Bld 096 (*) 70 - 99 mg/dL   Calcium, Ion 0.45 (*) 1.12 - 1.23 mmol/L   TCO2 21  0 - 100 mmol/L   Hemoglobin 14.3  12.0 - 15.0 g/dL   HCT 40.9  81.1 - 91.4 %  URINALYSIS, ROUTINE W REFLEX MICROSCOPIC     Status: Abnormal   Collection Time    07/26/13  7:02 AM      Result Value Range   Color, Urine YELLOW  YELLOW   APPearance CLEAR  CLEAR   Specific Gravity, Urine 1.012  1.005 - 1.030   pH 7.0  5.0 - 8.0   Glucose, UA NEGATIVE  NEGATIVE mg/dL   Hgb urine dipstick TRACE (*) NEGATIVE   Bilirubin Urine NEGATIVE  NEGATIVE   Ketones, ur 15 (*) NEGATIVE mg/dL   Protein, ur NEGATIVE  NEGATIVE mg/dL   Urobilinogen, UA 0.2  0.0 - 1.0 mg/dL   Nitrite NEGATIVE  NEGATIVE   Leukocytes, UA NEGATIVE  NEGATIVE  URINE MICROSCOPIC-ADD ON     Status: Abnormal   Collection Time    07/26/13  7:02 AM      Result Value Range   Squamous Epithelial / LPF MANY (*) RARE   RBC / HPF 0-2  <3 RBC/hpf  GLUCOSE, CAPILLARY     Status: Abnormal   Collection Time    07/27/13  6:33 AM      Result Value Range   Glucose-Capillary 101 (*) 70 - 99 mg/dL    Physical Findings: AIMS: Facial and Oral Movements Muscles of Facial Expression: None, normal Lips and Perioral Area: None, normal Jaw: None, normal Tongue: None, normal,Extremity Movements Upper (arms, wrists, hands, fingers): None, normal Lower (legs, knees, ankles, toes): None, normal, Trunk Movements Neck, shoulders, hips: None, normal, Overall Severity Severity of abnormal movements (highest score from questions above): None, normal Incapacitation due to abnormal movements: None, normal Patient's awareness of abnormal movements (rate only patient's report): No Awareness, Dental Status  Current problems with teeth and/or dentures?: No Does patient usually wear  dentures?: No  CIWA:  CIWA-Ar Total: 1 COWS:  COWS Total Score: 1  Psychiatric Specialty Exam: See Psychiatric Specialty Exam and Suicide Risk Assessment completed by Attending Physician prior to discharge.  Discharge destination:  Home  Is patient on multiple antipsychotic therapies at discharge:  No   Has Patient had three or more failed trials of antipsychotic monotherapy by history:  No  Recommended Plan for Multiple Antipsychotic Therapies: NA  Discharge Orders   Future Orders Complete By Expires   Activity as tolerated - No restrictions  As directed    Diet - low sodium heart healthy  As directed     As directed    Discharge instructions  As directed    Comments:     Take all of your medications as directed. Be sure to keep all of your follow up appointments.  If you are unable to keep your follow up appointment, call your Doctor's office to let them know, and reschedule.  Make sure that you have enough medication to last until your appointment. Be sure to get plenty of rest. Going to bed at the same time each night will help. Try to avoid sleeping during the day.  Increase your activity as tolerated. Regular exercise will help you to sleep better and improve your mental health. Eating a heart healthy diet is recommended. Try to avoid salty or fried foods. Be sure to avoid all alcohol and illegal drugs.   Increase activity slowly  As directed        Medication List    STOP taking these medications       ALPRAZolam 0.25 MG tablet  Commonly known as:  XANAX     cyclobenzaprine 10 MG tablet  Commonly known as:  FLEXERIL     traMADol 50 MG tablet  Commonly known as:  ULTRAM     TYLENOL 500 MG tablet  Generic drug:  acetaminophen      TAKE these medications     Indication   busPIRone 10 MG tablet  Commonly known as:  BUSPAR  Take 1 tablet (10 mg total) by mouth 2 (two) times daily. For anxiety disorder.   Indication:  Anxiety Disorder     dicyclomine 10 MG  capsule  Commonly known as:  BENTYL  Take 1 capsule (10 mg total) by mouth every 6 (six) hours as needed (cramping).   Indication:  Acute Small and Large Intestine Inflammation     doxycycline 100 MG capsule  Commonly known as:  VIBRAMYCIN  Take 1 capsule (100 mg total) by mouth 2 (two) times daily. For multiple tick bites.   Indication:  Early Stage of Lyme Disease     DULoxetine 30 MG capsule  Commonly known as:  CYMBALTA  Take 1 capsule (30 mg total) by mouth daily. For depression.   Indication:  Generalized Anxiety Disorder, Major Depressive Disorder     estrogens (conjugated) 0.625 MG tablet  Commonly known as:  PREMARIN  Take 1 tablet (0.625 mg total) by mouth daily. Take daily for 21 days then do not take for 7 days.   Indication:  Deficiency of the Hormone Estrogen     metoprolol tartrate 25 MG tablet  Commonly known as:  LOPRESSOR  Take 1 tablet (25 mg total) by mouth 2 (two) times daily.   Indication:  Arrythmia     pantoprazole 40 MG tablet  Commonly known as:  PROTONIX  Take 1  tablet (40 mg total) by mouth as needed. For GERD.   Indication:  Gastroesophageal Reflux Disease     progesterone 100 MG capsule  Commonly known as:  PROMETRIUM  Take 1 capsule (100 mg total) by mouth at bedtime. 1 tablet by mouth nightly for prevention of endometrial hyperplasia.   Indication:  Absence of Menstrual Periods in Woman who has had Them     traZODone 50 MG tablet  Commonly known as:  DESYREL  Take 1 tablet (50 mg total) by mouth at bedtime and may repeat dose one time if needed. For insomnia.   Indication:  Trouble Sleeping           Follow-up Information   Follow up with [facility] On 07/29/2013. (Please go to [facility]'s walk in clinic on Tuesday, July 29, 2013 or any weekday between 8 AM- 4 PM)    Contact information:       [address] [place], [place]   [address]      Follow-up recommendations:   Activities: Resume activity as tolerated. Diet: Heart healthy low sodium  diet Tests: Follow up testing will be determined by your out patient provider. Comments:    Total Discharge Time:  Greater than 30 minutes.  Signed: Rona Ravens. Mashburn RPAC 1:12 PM 07/28/2013  Patient is seen face to face evaluation, case discussed with treatment team and physician extender. Reviewed the information documented and agree with the treatment plan.  Nehemiah Settle., MD 07/28/2013 2:16 PM

## 2013-07-28 NOTE — Progress Notes (Signed)
Discharge Note:  Patient discharged home with family.  Patient received all clothing, miscellaneous items, toiletries, prescriptions, etc.  Patient received suicide prevention information which was discussed with her and she stated she understood and had no questions.  Patient stated she appreciated all assistance received from [facility] staff.

## 2013-07-28 NOTE — [facility] Suicide Risk Assessment (Signed)
Suicide Risk Assessment  Discharge Assessment     Demographic Factors:  Adolescent or young adult, Caucasian and Unemployed  Mental Status Per Nursing Assessment::   On Admission:  Self-harm thoughts  Current Mental Status by Physician: NA  Loss Factors: Financial problems/change in socioeconomic status  Historical Factors: Prior suicide attempts and Impulsivity  Risk Reduction Factors:   Sense of responsibility to family, Religious beliefs about death, Living with another person, especially a relative, Positive social support, Positive therapeutic relationship and Positive coping skills or problem solving skills  Continued Clinical Symptoms:  Severe Anxiety and/or Agitation Depression:   Recent sense of peace/wellbeing Previous Psychiatric Diagnoses and Treatments Medical Diagnoses and Treatments/Surgeries  Cognitive Features That Contribute To Risk:  Polarized thinking    Suicide Risk:  Minimal: No identifiable suicidal ideation.  Patients presenting with no risk factors but with morbid ruminations; may be classified as minimal risk based on the severity of the depressive symptoms  Discharge Diagnoses:   AXIS I:  Major Depression, Recurrent severe AXIS II:  Deferred AXIS III:   Past Medical History  Diagnosis Date  . Palpitations   . Fibromyalgia   . Scoliosis   . Arthritis   . Depression    AXIS IV:  other psychosocial or environmental problems and problems related to social environment AXIS V:  61-70 mild symptoms  Plan Of Care/Follow-up recommendations:  Activity:  As tolerated Diet:  Regular  Is patient on multiple antipsychotic therapies at discharge:  No   Has Patient had three or more failed trials of antipsychotic monotherapy by history:  No  Recommended Plan for Multiple Antipsychotic Therapies: NA  Nehemiah Settle., MD 07/28/2013, 12:52 PM

## 2013-07-28 NOTE — Progress Notes (Signed)
[facility] Adult Case Management Discharge Plan :  Will you be returning to the same living situation after discharge: Yes,  home with family At discharge, do you have transportation home?:Yes,  family came to pick patient up Do you have the ability to pay for your medications:Yes,  no barriers per report  Release of information consent forms completed and in the chart;  Patient's signature needed at discharge.  Patient to Follow up at: Follow-up Information   Follow up with [facility] On 07/29/2013. (Please go to [facility]'s walk in clinic on Tuesday, July 29, 2013 or any weekday between 8 AM- 4 PM)    Contact information:       [address] [place], [place]   [address]      Patient denies SI/HI:   Yes,  no barriers, or safety Civil engineer, contracting and Suicide Prevention discussed:  Yes,  completed, please see SI note   Patient and family on unit demanding DC. This was not a planned DC, thus patient will DC today with family. Patient reports that she wants information sent to PCP and to see a therapist. LCSW offered, but per husband, PCP has one in office and will follow up with appointment at DC. Husband will call and arrange. Nail, Catalina Gravel 07/28/2013, 1:44 PM

## 2013-07-29 LAB — B. BURGDORFI ANTIBODIES: B burgdorferi Ab IgG+IgM: 0.38 {ISR}

## 2013-07-31 NOTE — Progress Notes (Signed)
Patient Discharge Instructions:  After Visit Summary (AVS):   Faxed to:  06/30/13 Discharge Summary Note:   Faxed to:  06/30/13 Psychiatric Admission Assessment Note:   Faxed to:  06/30/13 Suicide Risk Assessment - Discharge Assessment:   Faxed to:  06/30/13 Faxed/Sent to the Next Level Care provider:  06/30/13 Faxed to [facility] @ [phone] Faxed to [facility] @ [phone]  Jerelene Redden, 07/31/2013, 3:47 PM

## 2013-08-01 MED ORDER — BACITRACIN-NEOMYCIN-POLYMYXIN 400-5-5000 EX OINT
TOPICAL_OINTMENT | CUTANEOUS | Status: AC
  Filled 2013-08-01: qty 1

## 2013-08-14 ENCOUNTER — Other Ambulatory Visit: Payer: Self-pay | Admitting: Family Medicine

## 2013-08-14 MED ORDER — TRAMADOL HCL 50 MG PO TABS: 50.0000 mg | ORAL_TABLET | Freq: Three times a day (TID) | ORAL | Status: DC | PRN

## 2013-08-14 MED ORDER — ALPRAZOLAM 0.25 MG PO TABS: 0.2500 mg | ORAL_TABLET | Freq: Two times a day (BID) | ORAL | Status: DC | PRN

## 2013-08-14 NOTE — Telephone Encounter (Addendum)
Both medications last filled 07/14/13.

## 2013-08-14 NOTE — Telephone Encounter (Signed)
[place] 

## 2013-08-29 ENCOUNTER — Encounter: Payer: Self-pay | Admitting: Radiology

## 2013-09-01 ENCOUNTER — Ambulatory Visit: Payer: Self-pay | Admitting: Family Medicine

## 2013-09-04 ENCOUNTER — Ambulatory Visit (INDEPENDENT_AMBULATORY_CARE_PROVIDER_SITE_OTHER): Payer: BC Managed Care – PPO | Admitting: Family Medicine

## 2013-09-04 ENCOUNTER — Telehealth: Payer: Self-pay | Admitting: Family Medicine

## 2013-09-04 ENCOUNTER — Encounter: Payer: Self-pay | Admitting: Family Medicine

## 2013-09-04 VITALS — BP 120/80 | HR 99 | Temp 98.1°F | Wt 192.0 lb

## 2013-09-04 DIAGNOSIS — IMO0001 Reserved for inherently not codable concepts without codable children: Secondary | ICD-10-CM

## 2013-09-04 DIAGNOSIS — F332 Major depressive disorder, recurrent severe without psychotic features: Secondary | ICD-10-CM

## 2013-09-04 DIAGNOSIS — Z23 Encounter for immunization: Secondary | ICD-10-CM

## 2013-09-04 DIAGNOSIS — M797 Fibromyalgia: Secondary | ICD-10-CM

## 2013-09-04 MED ORDER — TRAMADOL HCL 50 MG PO TABS: 50.0000 mg | ORAL_TABLET | Freq: Three times a day (TID) | ORAL | Status: DC | PRN

## 2013-09-04 MED ORDER — ALPRAZOLAM 0.25 MG PO TABS: 0.2500 mg | ORAL_TABLET | Freq: Two times a day (BID) | ORAL | Status: DC | PRN

## 2013-09-04 MED ORDER — PREGABALIN 75 MG PO CAPS: 75.0000 mg | ORAL_CAPSULE | Freq: Every day | ORAL | Status: DC

## 2013-09-04 NOTE — Telephone Encounter (Signed)
Pt was just seen and forgot to ask if she could get a refill on Tramadol and Xanax. She wanted to go back to her normal amounts of 60 mg for tramadol and 30 mg for xanax. [facility] [place]

## 2013-09-04 NOTE — Telephone Encounter (Signed)
Neither of those medications come in those doses.  Ok to refill as entered below.

## 2013-09-04 NOTE — Patient Instructions (Signed)
Good to see you. Please stop by to see Shirlee Limerick on your way out to set up your referral.  We are starting Lyrica 75 mg daily. Please call me in a couple of weeks with an update.

## 2013-09-04 NOTE — Progress Notes (Signed)
Subjective:    Patient ID: Margaret Wood, female    DOB: [birth year], 52 y.o.   MRN: [id number]  HPI Very pleasant 52 yo female here with her husband to discuss fibromyalgia and depression.  Unfortunately was hospitalized in 06/2013 for suicidal thoughts, major depressive episode.  Was started on cymbalta and initially felt better but then had a severe reaction- palpitations, nausea,  SOB. It was controlled release form and she is wondering if she should start an immediate release preparation. Has also been intolerant to zoloft, buspar, and other anxiolytics and antidepressants. Feels like her mood is ok but she is worried because the episode in August "came out of the blue."  Husband feels her fibro pain causes her depression.  Cymbalta did help tremendously with pain. Did not establish with a psychiatrist or psychotherapist since discharge in 06/2013.  Currently does not feel depressed or anxious.  She is "hurting all over." No SI currently.  Patient Active Problem List   Diagnosis Date Noted  . MDD (major depressive disorder), recurrent severe, without psychosis 07/24/2013  . GAD (generalized anxiety disorder) 07/24/2013  . Suicidal ideation 07/24/2013  . External hemorrhoid, bleeding 01/22/2013  . Symptoms, such as flushing, sleeplessness, headache, lack of concentration, associated with the menopause 05/29/2012  . Fibromyalgia   . Scoliosis   . SCOLIOSIS, LUMBAR SPINE 10/07/2010  . UNSPECIFIED ARTHROPATHY MULTIPLE SITES 04/27/2008   Past Medical History  Diagnosis Date  . Palpitations   . Fibromyalgia   . Scoliosis   . Arthritis   . Depression    Past Surgical History  Procedure Laterality Date  . Appendectomy  1980  . Cesarean section  1995  . Cholecystectomy  1997   History  Substance Use Topics  . Smoking status: Never Smoker   . Smokeless tobacco: Never Used  . Alcohol Use: No   No family history on file. Allergies  Allergen Reactions  . Amoxicillin  Nausea Only  . Cymbalta [Duloxetine Hcl]     SOB, palpitations, nausea  . Nsaids     REACTION: abd pain  . Tegretol [Carbamazepine]     Made her ill   Current Outpatient Prescriptions on File Prior to Visit  Medication Sig Dispense Refill  . ALPRAZolam (XANAX) 0.25 MG tablet Take 1 tablet (0.25 mg total) by mouth 2 (two) times daily as needed for sleep.  20 tablet  0  . dicyclomine (BENTYL) 10 MG capsule Take 1 capsule (10 mg total) by mouth every 6 (six) hours as needed (cramping).  90 capsule  0  . estrogens, conjugated, (PREMARIN) 0.625 MG tablet Take 1 tablet (0.625 mg total) by mouth daily. Take daily for 21 days then do not take for 7 days.      . metoprolol tartrate (LOPRESSOR) 25 MG tablet Take 1 tablet (25 mg total) by mouth 2 (two) times daily.      . pantoprazole (PROTONIX) 40 MG tablet Take 1 tablet (40 mg total) by mouth as needed. For GERD.  30 tablet  0  . progesterone (PROMETRIUM) 100 MG capsule Take 1 capsule (100 mg total) by mouth at bedtime. 1 tablet by mouth nightly for prevention of endometrial hyperplasia.       No current facility-administered medications on file prior to visit.   The PMH, PSH, Social History, Family History, Medications, and allergies have been reviewed in [facility], and have been updated if relevant.    Review of Systems    See HPI Objective:   Physical Exam BP 120/80  Pulse 99  Temp(Src) 98.1 F (36.7 C) (Tympanic)  Wt 192 lb (87.091 kg)  BMI 35.11 kg/m2  SpO2 98% Gen:  Alert, pleasant, NAD Psych:  Good eye contact, does not appear depressed or anxious today.   Assessment & Plan:  1. Need for prophylactic vaccination and inoculation against influenza  - Flu Vaccine QUAD 36+ mos IM  2. Fibromyalgia >25 min spent with face to face with patient, >50% counseling and/or coordinating care Deteriorated.  Discussed tx options with pt and her husband.  I would prefer to avoid cymbalta or SSRIs at this time given her history of  intolerances/allergies.  I did urge psychiatry referral for medications but they would prefer to hold off on this. We will try Lyrica- sub therapeutic dose initially- 75 mg daily and titrate up if tolerate. The patient indicates understanding of these issues and agrees with the plan.  - Ambulatory referral to Psychology  3. MDD (major depressive disorder), recurrent severe, without psychosis Stable current but clearly needs tx. She did agree to see a psychotherapist, referral placed. No changes to other meds today. - Ambulatory referral to Psychology

## 2013-09-04 NOTE — Telephone Encounter (Signed)
RX's phoned in to pharmacy.

## 2013-09-11 ENCOUNTER — Ambulatory Visit: Payer: PRIVATE HEALTH INSURANCE | Admitting: Psychology

## 2013-09-18 ENCOUNTER — Other Ambulatory Visit: Payer: Self-pay | Admitting: *Deleted

## 2013-09-18 MED ORDER — CYCLOBENZAPRINE HCL 10 MG PO TABS: ORAL_TABLET | ORAL | Status: DC

## 2013-09-18 NOTE — Telephone Encounter (Signed)
Fax refill request, Dr. Aron out of office, please advise  

## 2013-10-07 ENCOUNTER — Telehealth: Payer: Self-pay | Admitting: Family Medicine

## 2013-10-07 NOTE — Telephone Encounter (Signed)
[facility] Triage Call Report Triage Record Num: [id number] Operator: Aundra Millet Patient Name: Margaret Wood Call Date & Time: 10/06/2013 5:20:25PM Patient Phone: [phone] PCP: Ruthe Mannan Patient Gender: Female PCP Fax : [phone] Patient DOB: [birth year] Practice Name: [facility] Reason for Call: Caller: Stephen/Spouse; PCP: Ruthe Mannan (Family Practice); CB#: [phone]; LMP -- Perimenopausal . Today, 10/06/2013, pt c/o of severe headache , with vomiting and diarrhea since 1200 noon. She has had 10 vomiting and diarrhea episodes. Constant headache and rating 10/10 at times Mouth is getting dry. RN reached See Provider within 4 hours for new onset of vomiting associated with severe headache per Nausea or Vomiting protocol. RN advised pt to be seen now at [facility] now and husband agreed. Protocol(s) Used: Nausea or Vomiting Recommended Outcome per Protocol: See Provider within 4 hours Override Outcome if Used in Protocol: See Provider Immediately RN Reason for Override Outcome: Nursing Judgement Used. Reason for Outcome: New onset of vomiting associated with severe headache Care Advice: ~ Another adult should drive. ~ Do not eat or drink anything until evaluated by provider. ~ IMMEDIATE ACTION 10/06/2013 5:37:07PM Page 1 of 1 [email or web address]

## 2013-10-07 NOTE — Telephone Encounter (Signed)
Agree with ER.

## 2013-10-15 ENCOUNTER — Other Ambulatory Visit: Payer: Self-pay | Admitting: Family Medicine

## 2013-10-15 MED ORDER — ALPRAZOLAM 0.25 MG PO TABS: 0.2500 mg | ORAL_TABLET | Freq: Two times a day (BID) | ORAL | Status: DC | PRN

## 2013-10-15 MED ORDER — DICYCLOMINE HCL 10 MG PO CAPS: 10.0000 mg | ORAL_CAPSULE | Freq: Four times a day (QID) | ORAL | Status: DC | PRN

## 2013-10-15 MED ORDER — TRAMADOL HCL 50 MG PO TABS: 50.0000 mg | ORAL_TABLET | Freq: Three times a day (TID) | ORAL | Status: DC | PRN

## 2013-10-15 NOTE — Telephone Encounter (Signed)
Tramadol and Alprazolam phoned in.

## 2013-10-15 NOTE — Telephone Encounter (Signed)
Ok to phone in.

## 2013-11-16 ENCOUNTER — Other Ambulatory Visit: Payer: Self-pay | Admitting: Internal Medicine

## 2013-11-17 NOTE — Telephone Encounter (Signed)
Ok to phone in xanax and tramadol 

## 2013-11-17 NOTE — Telephone Encounter (Signed)
Rx called in to pharmacy. 

## 2013-12-09 ENCOUNTER — Other Ambulatory Visit: Payer: Self-pay | Admitting: *Deleted

## 2013-12-09 MED ORDER — PANTOPRAZOLE SODIUM 40 MG PO TBEC: 40.0000 mg | DELAYED_RELEASE_TABLET | ORAL | Status: DC | PRN

## 2013-12-20 ENCOUNTER — Other Ambulatory Visit: Payer: Self-pay | Admitting: Internal Medicine

## 2013-12-22 NOTE — Telephone Encounter (Signed)
[facility] [phone] as instructed.

## 2013-12-22 NOTE — Telephone Encounter (Signed)
Last filled 11/16/13--please advise 

## 2014-01-22 ENCOUNTER — Telehealth: Payer: Self-pay | Admitting: Family Medicine

## 2014-01-23 NOTE — Telephone Encounter (Signed)
Pt requesting medication refill. Last ov 08/2013 with no future appts scheduled. pls advise 

## 2014-01-23 NOTE — Telephone Encounter (Signed)
Lm on pts vm informing her Rx sent to requested pharmacy 

## 2014-01-27 ENCOUNTER — Other Ambulatory Visit: Payer: Self-pay | Admitting: Family Medicine

## 2014-01-28 NOTE — Telephone Encounter (Signed)
Spoke to pharmacy who states that Rx was received. Contacted pts husband and informed him Rx available for pickup

## 2014-01-28 NOTE — Telephone Encounter (Signed)
Mr Margaret Wood left vm; [facility]. Mr Margaret Wood request cb.

## 2014-02-23 ENCOUNTER — Encounter: Payer: Self-pay | Admitting: Endocrinology

## 2014-02-23 ENCOUNTER — Ambulatory Visit (INDEPENDENT_AMBULATORY_CARE_PROVIDER_SITE_OTHER): Payer: BC Managed Care – PPO | Admitting: Endocrinology

## 2014-02-23 VITALS — BP 118/78 | HR 106 | Temp 98.1°F | Ht 62.0 in | Wt 177.0 lb

## 2014-02-23 DIAGNOSIS — N951 Menopausal and female climacteric states: Secondary | ICD-10-CM

## 2014-02-23 MED ORDER — COSYNTROPIN 0.25 MG IJ SOLR
0.2500 mg | Freq: Once | INTRAMUSCULAR | Status: AC
  Administered 2014-02-23: 0.25 mg via INTRAMUSCULAR

## 2014-02-23 NOTE — Progress Notes (Signed)
Subjective:    Patient ID: Margaret Wood, female    DOB: [birth year], 53 y.o.   MRN: [id number]  HPI Pt states 20 years of intermittent moderate palpitations in the chest, and assoc fatigue. Past Medical History  Diagnosis Date  . Palpitations   . Fibromyalgia   . Scoliosis   . Arthritis   . Depression     Past Surgical History  Procedure Laterality Date  . Appendectomy  1980  . Cesarean section  1995  . Cholecystectomy  1997    History   Social History  . Marital Status: Married    Spouse Name: N/A    Number of Children: 2  . Years of Education: N/A   Occupational History  . UNEMPLOYED    Social History Main Topics  . Smoking status: Never Smoker   . Smokeless tobacco: Never Used  . Alcohol Use: No  . Drug Use: No  . Sexual Activity: Yes   Other Topics Concern  . Not on file   Social History Narrative   Mother is Sherrie MustacheGail Russell.    Current Outpatient Prescriptions on File Prior to Visit  Medication Sig Dispense Refill  . ALPRAZolam (XANAX) 0.25 MG tablet TAKE 1 TABLET BY MOUTH TWICE A DAY  60 tablet  0  . cyclobenzaprine (FLEXERIL) 10 MG tablet Take 1 tablet by mouth at bedtime  30 tablet  0  . dicyclomine (BENTYL) 10 MG capsule Take 1 capsule (10 mg total) by mouth every 6 (six) hours as needed (cramping).  90 capsule  0  . metoprolol tartrate (LOPRESSOR) 25 MG tablet Take 1 tablet (25 mg total) by mouth 2 (two) times daily.      . pantoprazole (PROTONIX) 40 MG tablet Take 1 tablet (40 mg total) by mouth as needed. For GERD.  90 tablet  0  . traMADol (ULTRAM) 50 MG tablet TAKE 1 TABLET BY MOUTH EVERY 8HRS AS NEEDED  60 tablet  0  . estrogens, conjugated, (PREMARIN) 0.625 MG tablet Take 1 tablet (0.625 mg total) by mouth daily. Take daily for 21 days then do not take for 7 days.      . pregabalin (LYRICA) 75 MG capsule Take 1 capsule (75 mg total) by mouth daily.  30 capsule  0  . progesterone (PROMETRIUM) 100 MG capsule Take 1 capsule (100 mg total) by  mouth at bedtime. 1 tablet by mouth nightly for prevention of endometrial hyperplasia.       No current facility-administered medications on file prior to visit.    Allergies  Allergen Reactions  . Amoxicillin Nausea Only  . Cymbalta [Duloxetine Hcl]     SOB, palpitations, nausea  . Nsaids     REACTION: abd pain  . Tegretol [Carbamazepine]     Made her ill    No family history on file. Grandmother had hyperthyroidism Brother has DM BP 118/78  Pulse 106  Temp(Src) 98.1 F (36.7 C) (Oral)  Ht 5\' 2"  (1.575 m)  Wt 177 lb (80.287 kg)  BMI 32.37 kg/m2  SpO2 96%  Review of Systems She also reports difficulty losing weight, abdominal cramps, hair loss, rhinorrhea, myalgias, easy bruising, difficulty with concentration, doe, fever, leg cramps, and dry skin.   She has reactive hypoglycemia.  Depression is much better recently.  She denies numbness, blurry vision, and syncope.      Objective:   Physical Exam VS: see vs page GEN: no distress HEAD: head: no deformity eyes: no periorbital swelling, no proptosis external nose  and ears are normal mouth: no lesion seen NECK: supple, thyroid is not enlarged CHEST WALL: no deformity LUNGS:  Clear to auscultation CV: reg rate and rhythm, no murmur ABD: abdomen is soft, nontender.  no hepatosplenomegaly.  not distended.  no hernia MUSCULOSKELETAL: muscle bulk and strength are grossly normal.  no obvious joint swelling.  gait is normal and steady EXTEMITIES: no deformity.  no edema PULSES: no carotid bruit NEURO:  cn 2-12 grossly intact.   readily moves all 4's.  sensation is intact to touch on all 4's.   SKIN:  Normal texture and temperature.  No rash or suspicious lesion is visible.   NODES:  None palpable at the neck PSYCH: alert, well-oriented.  Does not appear anxious nor depressed.  Lab Results  Component Value Date   TSH 1.04 02/23/2014  acth stimulation test is done: baseline cortisol level=14 then cosyntropin 250 mcg is  given im 45 minutes later, cortisol level=25 (normal response)    Assessment & Plan:  Palpitations: nor thyroid-related Reactive hypoglycemia: no adrenal cause is found. Difficulty with concentration: not thyroid-related.

## 2014-02-23 NOTE — Patient Instructions (Addendum)
blood tests are being requested for you today.  We'll contact you with results.  

## 2014-02-24 LAB — CORTISOL
CORTISOL PLASMA: 24.5 ug/dL
Cortisol, Plasma: 13.5 ug/dL

## 2014-02-24 LAB — HEMOGLOBIN A1C: HEMOGLOBIN A1C: 5.1 % (ref 4.6–6.5)

## 2014-02-24 LAB — T4, FREE: FREE T4: 0.73 ng/dL (ref 0.60–1.60)

## 2014-02-24 LAB — TSH: TSH: 1.04 u[IU]/mL (ref 0.35–5.50)

## 2014-03-02 ENCOUNTER — Other Ambulatory Visit: Payer: Self-pay | Admitting: Family Medicine

## 2014-03-03 NOTE — Telephone Encounter (Signed)
Pt requesting medication refill. Last ov 08/2013 with no future appts scheduled. pls advise 

## 2014-03-03 NOTE — Telephone Encounter (Signed)
Lm on pts vm informing her Rx has been faxed to requested pharmacy.  

## 2014-03-26 ENCOUNTER — Other Ambulatory Visit: Payer: Self-pay

## 2014-04-01 ENCOUNTER — Other Ambulatory Visit: Payer: Self-pay | Admitting: Family Medicine

## 2014-04-01 NOTE — Telephone Encounter (Signed)
Pt left v/m requesting refill metoprolol to [facility]; Dr Dayton MartesAron has not prescribed before; pt last seen 09/04/13.Please advise.

## 2014-04-02 MED ORDER — METOPROLOL TARTRATE 25 MG PO TABS: 25.0000 mg | ORAL_TABLET | Freq: Two times a day (BID) | ORAL | Status: DC

## 2014-04-02 NOTE — Telephone Encounter (Signed)
Needs an appt

## 2014-04-06 ENCOUNTER — Other Ambulatory Visit: Payer: Self-pay | Admitting: Family Medicine

## 2014-04-06 NOTE — Telephone Encounter (Signed)
Pt requesting medication refill. Last ov 08/2013 with no future appt scheduled. pls advise

## 2014-04-06 NOTE — Telephone Encounter (Signed)
Pt left v/m requesting refill alprazolam and tramadol to [facility]. Pt request cb when refilled.

## 2014-04-06 NOTE — Telephone Encounter (Signed)
Lm on pts vm informing her Rx has been faxed to requested pharmacy.  

## 2014-05-06 ENCOUNTER — Other Ambulatory Visit: Payer: Self-pay | Admitting: Family Medicine

## 2014-05-06 NOTE — Telephone Encounter (Signed)
Pt requesting medication refill. Last f/u appt 08/2013 with no future appts scheduled. pls advise 

## 2014-05-07 NOTE — Telephone Encounter (Signed)
Lm on pts vm informing her Rx has called in to requested pharmacy

## 2014-06-02 ENCOUNTER — Other Ambulatory Visit: Payer: Self-pay | Admitting: Family Medicine

## 2014-06-07 ENCOUNTER — Other Ambulatory Visit: Payer: Self-pay | Admitting: Family Medicine

## 2014-06-08 NOTE — Telephone Encounter (Signed)
Needs appt ASAP- refill rx until appt only.

## 2014-06-08 NOTE — Telephone Encounter (Signed)
Received refill request electronically. Last refill on Tramadol #60, Xanax #60 on 05/06/14. Last office visit 09/04/13. Last refill patient was informed needs office visit for further refills. No appointment scheduled. Is it okay to refill medication?

## 2014-06-12 ENCOUNTER — Encounter: Payer: Self-pay | Admitting: Family Medicine

## 2014-06-12 ENCOUNTER — Ambulatory Visit (INDEPENDENT_AMBULATORY_CARE_PROVIDER_SITE_OTHER): Payer: BC Managed Care – PPO | Admitting: Family Medicine

## 2014-06-12 VITALS — BP 116/74 | HR 83 | Temp 98.0°F | Wt 171.5 lb

## 2014-06-12 DIAGNOSIS — IMO0001 Reserved for inherently not codable concepts without codable children: Secondary | ICD-10-CM

## 2014-06-12 DIAGNOSIS — F332 Major depressive disorder, recurrent severe without psychotic features: Secondary | ICD-10-CM

## 2014-06-12 DIAGNOSIS — M797 Fibromyalgia: Secondary | ICD-10-CM

## 2014-06-12 MED ORDER — ALPRAZOLAM 0.25 MG PO TABS: ORAL_TABLET | ORAL | Status: DC

## 2014-06-12 MED ORDER — TRAMADOL HCL 50 MG PO TABS: ORAL_TABLET | ORAL | Status: DC

## 2014-06-12 NOTE — Progress Notes (Signed)
Pre visit review using our clinic review tool, if applicable. No additional management support is needed unless otherwise documented below in the visit note. 

## 2014-06-12 NOTE — Assessment & Plan Note (Signed)
Stable. Tramadol rx refilled today- hard copy given to pt.

## 2014-06-12 NOTE — Assessment & Plan Note (Signed)
Improved symptoms with lifestyle changes and weight loss. Xanax rx refilled. I encouraged her to maintain contact with her psychotherapy. The patient indicates understanding of these issues and agrees with the plan.

## 2014-06-12 NOTE — Progress Notes (Signed)
Subjective:   Patient ID: Margaret Wood, female    DOB: Jul 26, 1961, 53 y.o.   MRN: [id number]  Margaret Wood is a pleasant 53 y.o. year old female who presents to clinic today with Follow-up  on 06/12/2014  HPI:  MDD- stopped going to psychotherapy. Feels great- cut out sugary sodas, exercising daily. Has lost 21 lb!   Wt Readings from Last 3 Encounters:  06/12/14 171 lb 8 oz (77.792 kg)  02/23/14 177 lb (80.287 kg)  09/04/13 192 lb (87.091 kg)   Lab Results  Component Value Date   CREATININE 1.00 07/26/2013   Fibromyalgia- symptoms have never been so stable.  Exercise and weight loss have been helpful.  Still needs to take prn Tramadol.  Has no complaints today.  Current Outpatient Prescriptions on File Prior to Visit  Medication Sig Dispense Refill  . cyclobenzaprine (FLEXERIL) 10 MG tablet TAKE 1 TABLET BY MOUTH AT BEDTIME  30 tablet  0  . dicyclomine (BENTYL) 10 MG capsule Take 1 capsule (10 mg total) by mouth every 6 (six) hours as needed (cramping).  90 capsule  0  . estrogens, conjugated, (PREMARIN) 0.625 MG tablet Take 1 tablet (0.625 mg total) by mouth daily. Take daily for 21 days then do not take for 7 days.      . metoprolol tartrate (LOPRESSOR) 25 MG tablet TAKE 1 TABLET (25 MG TOTAL) BY MOUTH 2 (TWO) TIMES DAILY.  60 tablet  0  . pantoprazole (PROTONIX) 40 MG tablet Take 1 tablet (40 mg total) by mouth as needed. For GERD.  90 tablet  0  . progesterone (PROMETRIUM) 100 MG capsule Take 1 capsule (100 mg total) by mouth at bedtime. 1 tablet by mouth nightly for prevention of endometrial hyperplasia.       No current facility-administered medications on file prior to visit.    Allergies  Allergen Reactions  . Amoxicillin Nausea Only  . Cymbalta [Duloxetine Hcl]     SOB, palpitations, nausea  . Nsaids     REACTION: abd pain  . Tegretol [Carbamazepine]     Made her ill    Past Medical History  Diagnosis Date  . Palpitations   . Fibromyalgia   .  Scoliosis   . Arthritis   . Depression     Past Surgical History  Procedure Laterality Date  . Appendectomy  1980  . Cesarean section  1995  . Cholecystectomy  1997    No family history on file.  History   Social History  . Marital Status: Married    Spouse Name: N/A    Number of Children: 2  . Years of Education: N/A   Occupational History  . UNEMPLOYED    Social History Main Topics  . Smoking status: Never Smoker   . Smokeless tobacco: Never Used  . Alcohol Use: No  . Drug Use: No  . Sexual Activity: Yes   Other Topics Concern  . Not on file   Social History Narrative   Mother is Sherrie Mustache.   The PMH, PSH, Social History, Family History, Medications, and allergies have been reviewed in [facility], and have been updated if relevant.   Review of Systems Denies any recent SI No symptoms of anxiety or depression +intermittent hip and shoulder pain    Objective:    BP 116/74  Pulse 83  Temp(Src) 98 F (36.7 C) (Oral)  Wt 171 lb 8 oz (77.792 kg)  SpO2 97%   Physical Exam  Nursing note and  vitals reviewed. Constitutional: She appears well-developed and well-nourished. No distress.  Cardiovascular: Normal rate.   Pulmonary/Chest: Effort normal.  Skin: Skin is warm.  Psychiatric: She has a normal mood and affect. Her behavior is normal. Judgment and thought content normal.          Assessment & Plan:   Fibromyalgia  MDD (major depressive disorder), recurrent severe, without psychosis No Follow-up on file.

## 2014-06-12 NOTE — Patient Instructions (Signed)
You look great!!!

## 2014-07-13 ENCOUNTER — Other Ambulatory Visit: Payer: Self-pay | Admitting: Family Medicine

## 2014-07-13 NOTE — Telephone Encounter (Signed)
Rx faxed to requested pharmacy 

## 2014-07-13 NOTE — Telephone Encounter (Signed)
Pt requesting medication refill. Last f/u appt 05/2014 with no future appts scheduled. pls advise 

## 2014-08-09 ENCOUNTER — Other Ambulatory Visit: Payer: Self-pay | Admitting: Family Medicine

## 2014-08-14 ENCOUNTER — Other Ambulatory Visit: Payer: Self-pay | Admitting: Family Medicine

## 2014-08-14 NOTE — Telephone Encounter (Signed)
Pt requesting medication refill. Last f/u appt 05/2014. Ok to fill per Dr Dayton Martes. Rx will be faxed to requested pharmacy before the end of day.

## 2014-09-21 ENCOUNTER — Other Ambulatory Visit: Payer: Self-pay | Admitting: *Deleted

## 2014-09-21 MED ORDER — TRAMADOL HCL 50 MG PO TABS: ORAL_TABLET | ORAL | Status: DC

## 2014-09-21 NOTE — Telephone Encounter (Signed)
Pt requesting medication refill. Last f/u appt 05/2014. Ok to fill per Dr Aron. Rx to be faxed to requested pharmacy before end of day, today.  

## 2014-09-23 ENCOUNTER — Other Ambulatory Visit: Payer: Self-pay

## 2014-09-23 MED ORDER — ALPRAZOLAM 0.25 MG PO TABS: ORAL_TABLET | ORAL | Status: DC

## 2014-09-23 NOTE — Telephone Encounter (Signed)
Ok to phone in xanax 

## 2014-09-23 NOTE — Telephone Encounter (Signed)
Pt left note requesting refill alprazolam to [facility].Please advise.  

## 2014-09-24 NOTE — Telephone Encounter (Signed)
Rx called in to requested pharmacy 

## 2014-10-28 ENCOUNTER — Other Ambulatory Visit: Payer: Self-pay | Admitting: *Deleted

## 2014-10-28 MED ORDER — ALPRAZOLAM 0.25 MG PO TABS: ORAL_TABLET | ORAL | Status: DC

## 2014-10-28 MED ORDER — METOPROLOL TARTRATE 25 MG PO TABS: ORAL_TABLET | ORAL | Status: DC

## 2014-10-28 MED ORDER — TRAMADOL HCL 50 MG PO TABS: ORAL_TABLET | ORAL | Status: DC

## 2014-10-28 NOTE — Telephone Encounter (Signed)
Rx called in to requested pharmacy 

## 2014-10-28 NOTE — Telephone Encounter (Signed)
Pt requesting medication refill. Last f/u appt 05/2014. pls advise 

## 2014-11-26 ENCOUNTER — Other Ambulatory Visit: Payer: Self-pay | Admitting: *Deleted

## 2014-11-26 ENCOUNTER — Telehealth: Payer: Self-pay | Admitting: Internal Medicine

## 2014-11-26 MED ORDER — ALPRAZOLAM 0.25 MG PO TABS: ORAL_TABLET | ORAL | Status: DC

## 2014-11-26 MED ORDER — TRAMADOL HCL 50 MG PO TABS: ORAL_TABLET | ORAL | Status: DC

## 2014-11-26 NOTE — Telephone Encounter (Signed)
Pt requesting medication refill. Last f/u appt 05/2014. pls advise 

## 2014-11-26 NOTE — Telephone Encounter (Signed)
Ok to phone in tramadol and xanax

## 2014-11-30 ENCOUNTER — Encounter: Payer: Self-pay | Admitting: Family Medicine

## 2014-11-30 NOTE — Telephone Encounter (Signed)
Pt left v/m requesting cb about tramadol and xanax refill.

## 2014-12-01 NOTE — Telephone Encounter (Signed)
Pt scheduled med refill appt 12/14/14. Please refill med-pt is completely out

## 2014-12-01 NOTE — Telephone Encounter (Signed)
Rx called in to requested pharmacy 

## 2014-12-02 MED ORDER — DICYCLOMINE HCL 10 MG PO CAPS: 10.0000 mg | ORAL_CAPSULE | Freq: Four times a day (QID) | ORAL | Status: DC | PRN

## 2014-12-02 NOTE — Telephone Encounter (Signed)
Rx sent to requested pharmacy

## 2014-12-02 NOTE — Addendum Note (Signed)
Addended by: Desmond DikeKNIGHT, Curby Carswell H on: 12/02/2014 09:10 AM   Modules accepted: Orders

## 2014-12-08 ENCOUNTER — Ambulatory Visit (INDEPENDENT_AMBULATORY_CARE_PROVIDER_SITE_OTHER): Payer: Self-pay | Admitting: Family Medicine

## 2014-12-08 ENCOUNTER — Encounter: Payer: Self-pay | Admitting: Family Medicine

## 2014-12-08 VITALS — BP 142/90 | HR 115 | Temp 98.1°F | Wt 180.5 lb

## 2014-12-08 DIAGNOSIS — J069 Acute upper respiratory infection, unspecified: Secondary | ICD-10-CM | POA: Insufficient documentation

## 2014-12-08 DIAGNOSIS — R112 Nausea with vomiting, unspecified: Secondary | ICD-10-CM | POA: Insufficient documentation

## 2014-12-08 MED ORDER — PROMETHAZINE HCL 12.5 MG PO TABS: 12.5000 mg | ORAL_TABLET | Freq: Three times a day (TID) | ORAL | Status: DC | PRN

## 2014-12-08 MED ORDER — AZITHROMYCIN 250 MG PO TABS: ORAL_TABLET | ORAL | Status: DC

## 2014-12-08 MED ORDER — PROMETHAZINE HCL 25 MG/ML IJ SOLN
25.0000 mg | Freq: Once | INTRAMUSCULAR | Status: AC
Start: 2014-12-08 — End: 2014-12-08
  Administered 2014-12-08: 25 mg via INTRAMUSCULAR

## 2014-12-08 NOTE — Progress Notes (Signed)
Subjective:   Patient ID: Margaret Wood, female    DOB: March 23, 1961, 54 y.o.   MRN: [id number]  Margaret Wood is a pleasant 54 y.o. year old female who presents to clinic today with her husband for Follow-up; Medication Refill; and Sore Throat  on 12/08/2014  HPI:  Sore throat, HA and nausea, vomiting and fever (tmax 101) for past 5 days.  Husband had similar symptoms and treated with abx for bronchitis per pt's husband.  Symptoms started with malaise, body and aches and low grade temp but has progressed.   No diarrhea.  Does have productive cough and some post tussive emesis today.   Current Outpatient Prescriptions on File Prior to Visit  Medication Sig Dispense Refill  . ALPRAZolam (XANAX) 0.25 MG tablet TAKE 1 TABLET BY MOUTH TWICE A DAY 60 tablet 0  . cyclobenzaprine (FLEXERIL) 10 MG tablet TAKE 1 TABLET BY MOUTH AT BEDTIME 30 tablet 0  . dicyclomine (BENTYL) 10 MG capsule Take 1 capsule (10 mg total) by mouth every 6 (six) hours as needed (cramping). 90 capsule 0  . metoprolol tartrate (LOPRESSOR) 25 MG tablet TAKE 1 TABLET (25 MG TOTAL) BY MOUTH 2 (TWO) TIMES DAILY. 180 tablet 0  . pantoprazole (PROTONIX) 40 MG tablet Take 1 tablet (40 mg total) by mouth as needed. For GERD. 90 tablet 0  . PREMARIN 0.625 MG tablet TAKE 1 TABLET BY MOUTH ONCE A DAY 90 tablet 0  . progesterone (PROMETRIUM) 100 MG capsule TAKE 1 CAPSULE BY MOUTH NIGHTLY 30 capsule 5  . traMADol (ULTRAM) 50 MG tablet TAKE 1 TABLET BY MOUTH EVERY 8 HOURS AS NEEDED 60 tablet 0   No current facility-administered medications on file prior to visit.    Allergies  Allergen Reactions  . Amoxicillin Nausea Only  . Cymbalta [Duloxetine Hcl]     SOB, palpitations, nausea  . Nsaids     REACTION: abd pain  . Tegretol [Carbamazepine]     Made her ill    Past Medical History  Diagnosis Date  . Palpitations   . Fibromyalgia   . Scoliosis   . Arthritis   . Depression     Past Surgical History  Procedure  Laterality Date  . Appendectomy  1980  . Cesarean section  1995  . Cholecystectomy  1997    History reviewed. No pertinent family history.  History   Social History  . Marital Status: Married    Spouse Name: N/A    Number of Children: 2  . Years of Education: N/A   Occupational History  . UNEMPLOYED    Social History Main Topics  . Smoking status: Never Smoker   . Smokeless tobacco: Never Used  . Alcohol Use: No  . Drug Use: No  . Sexual Activity: Yes   Other Topics Concern  . Not on file   Social History Narrative   Mother is Sherrie MustacheGail Russell.   The PMH, PSH, Social History, Family History, Medications, and allergies [facility], and have been updated if relevant.   Review of Systems  Constitutional: Positive for fever, chills and appetite change.  HENT: Positive for sore throat.   Respiratory: Positive for cough. Negative for shortness of breath, wheezing and stridor.   Cardiovascular: Negative.   Gastrointestinal: Positive for nausea and vomiting. Negative for diarrhea and rectal pain.  Endocrine: Negative.   Genitourinary: Negative.   Musculoskeletal: Positive for myalgias.  Skin: Negative.   Allergic/Immunologic: Negative.   Neurological: Positive for headaches.  Negative for dizziness, seizures, speech difficulty, weakness, light-headedness and numbness.  All other systems reviewed and are negative.      Objective:    BP 142/90 mmHg  Pulse 115  Temp(Src) 98.1 F (36.7 C) (Oral)  Wt 180 lb 8 oz (81.874 kg)  SpO2 96%   Physical Exam  Constitutional: She is oriented to person, place, and time. She appears well-developed.  Tearful, actively coughing/wretching and vomiting during exam  HENT:  Head: Normocephalic.  Right Ear: Hearing and tympanic membrane normal.  Left Ear: Hearing and tympanic membrane normal.  Mouth/Throat: Mucous membranes are normal. Posterior oropharyngeal erythema present. No oropharyngeal exudate or tonsillar  abscesses.  Cardiovascular: Regular rhythm.  Tachycardia present.   Pulmonary/Chest: Effort normal. No respiratory distress.  Abdominal: Soft.  Neurological: She is alert and oriented to person, place, and time. No cranial nerve deficit.  Skin: Skin is warm and dry.  Psychiatric: She has a normal mood and affect. Her behavior is normal. Judgment and thought content normal.  Nursing note and vitals reviewed.         Assessment & Plan:   Non-intractable vomiting with nausea, vomiting of unspecified type - Plan: promethazine (PHENERGAN) injection 25 mg  Acute upper respiratory infection No Follow-up on file.

## 2014-12-08 NOTE — Patient Instructions (Signed)
Good to see you. I hope you feel better. Please take zpack as directed.  Phenergan as needed for nausea. Please call us for an update.

## 2014-12-08 NOTE — Progress Notes (Signed)
Pre visit review using our clinic review tool, if applicable. No additional management support is needed unless otherwise documented below in the visit note. 

## 2014-12-08 NOTE — Assessment & Plan Note (Signed)
New- Given duration and progression of symptoms, will treat for bacterial process to cover strep as well (rapid strep neg).  PCN allergic- tx with zpack. Call or return to clinic prn if these symptoms worsen or fail to improve as anticipated. The patient indicates understanding of these issues and agrees with the plan.

## 2014-12-08 NOTE — Assessment & Plan Note (Signed)
New- progressive. Unclear if post tussive emesis or due to infectious process. Rapid strep neg. IM phenergan given in office, eRx sent for prn phenergan for nausea/vomiting.

## 2014-12-14 ENCOUNTER — Ambulatory Visit: Payer: Self-pay | Admitting: Family Medicine

## 2015-01-03 ENCOUNTER — Other Ambulatory Visit: Payer: Self-pay | Admitting: Family Medicine

## 2015-01-04 NOTE — Telephone Encounter (Signed)
Last f/u appt 11/2014 

## 2015-01-04 NOTE — Telephone Encounter (Signed)
Rx called in to requested pharmacy 

## 2015-01-31 ENCOUNTER — Other Ambulatory Visit: Payer: Self-pay | Admitting: Family Medicine

## 2015-02-01 NOTE — Telephone Encounter (Signed)
Last f/u 11/2014. 

## 2015-02-01 NOTE — Telephone Encounter (Signed)
Rx called in to requested pharmacy 

## 2015-02-21 ENCOUNTER — Other Ambulatory Visit: Payer: Self-pay | Admitting: Family Medicine

## 2015-03-09 ENCOUNTER — Other Ambulatory Visit: Payer: Self-pay | Admitting: Family Medicine

## 2015-03-09 NOTE — Telephone Encounter (Signed)
Last f/u appt 11/2014 

## 2015-03-10 ENCOUNTER — Other Ambulatory Visit: Payer: Self-pay | Admitting: Family Medicine

## 2015-03-10 NOTE — Telephone Encounter (Signed)
Last f/u appt 11/2014 

## 2015-03-10 NOTE — Telephone Encounter (Signed)
Rx called in to requesting pharmacy

## 2015-03-10 NOTE — Telephone Encounter (Signed)
Rx called in to requested pharmacy 

## 2015-04-11 ENCOUNTER — Other Ambulatory Visit: Payer: Self-pay | Admitting: Family Medicine

## 2015-04-12 NOTE — Telephone Encounter (Signed)
Rx called in to requested pharmacy 

## 2015-04-12 NOTE — Telephone Encounter (Signed)
Last f/u appt 05/2014 

## 2015-04-30 ENCOUNTER — Other Ambulatory Visit: Payer: Self-pay | Admitting: Family Medicine

## 2015-05-03 ENCOUNTER — Encounter: Payer: Self-pay | Admitting: Family Medicine

## 2015-05-03 ENCOUNTER — Ambulatory Visit (INDEPENDENT_AMBULATORY_CARE_PROVIDER_SITE_OTHER): Payer: BLUE CROSS/BLUE SHIELD | Admitting: Family Medicine

## 2015-05-03 VITALS — BP 126/88 | HR 101 | Temp 98.0°F | Wt 189.0 lb

## 2015-05-03 DIAGNOSIS — Z79890 Hormone replacement therapy (postmenopausal): Secondary | ICD-10-CM | POA: Insufficient documentation

## 2015-05-03 MED ORDER — ESTRADIOL-NORETHINDRONE ACET 0.05-0.14 MG/DAY TD PTTW: 1.0000 | MEDICATED_PATCH | TRANSDERMAL | Status: DC

## 2015-05-03 NOTE — Progress Notes (Signed)
Subjective:    Patient ID: Margaret Wood, female    DOB: Apr 01, 1961, 54 y.o.   MRN: [id number]  HPI  54 yo here to discuss HRT.  Has been taking estrogen with progesterone tablets (she still has a uterus).  Has tried to wean off of the them but the hot flashes and mood swings were "unbearable."  Asking if she could try a patch because she thinks the estrogen upsets her stomach when she swallows it.  Does not seem to be having the same issue with progesterone.  She does not have any personal or family h/o breast, uterine, or ovarian CA. No h/o clotting disorders or DVTs.  Patient Active Problem List   Diagnosis Date Noted  . Postmenopausal HRT (hormone replacement therapy) 05/03/2015  . MDD (major depressive disorder), recurrent severe, without psychosis 07/24/2013  . GAD (generalized anxiety disorder) 07/24/2013  . Suicidal ideation 07/24/2013  . External hemorrhoid, bleeding 01/22/2013  . Symptoms, such as flushing, sleeplessness, headache, lack of concentration, associated with the menopause 05/29/2012  . Fibromyalgia   . Scoliosis   . SCOLIOSIS, LUMBAR SPINE 10/07/2010  . UNSPECIFIED ARTHROPATHY MULTIPLE SITES 04/27/2008   Past Medical History  Diagnosis Date  . Palpitations   . Fibromyalgia   . Scoliosis   . Arthritis   . Depression    Past Surgical History  Procedure Laterality Date  . Appendectomy  1980  . Cesarean section  1995  . Cholecystectomy  1997   History  Substance Use Topics  . Smoking status: Never Smoker   . Smokeless tobacco: Never Used  . Alcohol Use: No   History reviewed. No pertinent family history. Allergies  Allergen Reactions  . Amoxicillin Nausea Only  . Cymbalta [Duloxetine Hcl]     SOB, palpitations, nausea  . Nsaids     REACTION: abd pain  . Tegretol [Carbamazepine]     Made her ill   Current Outpatient Prescriptions on File Prior to Visit  Medication Sig Dispense Refill  . ALPRAZolam (XANAX) 0.25 MG tablet TAKE ONE TABLET  BY MOUTH TWICE A DAY AS NEEDED 60 tablet 0  . cyclobenzaprine (FLEXERIL) 10 MG tablet TAKE 1 TABLET BY MOUTH AT BEDTIME 30 tablet 2  . dicyclomine (BENTYL) 10 MG capsule TAKE 1 CAPSULE (10 MG TOTAL) BY MOUTH EVERY 6 (SIX) HOURS AS NEEDED (CRAMPING). 90 capsule 0  . metoprolol tartrate (LOPRESSOR) 25 MG tablet TAKE 1 TABLET BY MOUTH TWICE A DAY 180 tablet 0  . pantoprazole (PROTONIX) 40 MG tablet Take 1 tablet (40 mg total) by mouth as needed. For GERD. 90 tablet 0  . promethazine (PHENERGAN) 12.5 MG tablet Take 1 tablet (12.5 mg total) by mouth every 8 (eight) hours as needed for nausea or vomiting. 20 tablet 0  . traMADol (ULTRAM) 50 MG tablet TAKE 1 TABLET BY MOUTH EVERY 8 HOURS AS NEEDED 60 tablet 0   No current facility-administered medications on file prior to visit.   The PMH, PSH, Social History, Family History, Medications, and allergies have been reviewed in [facility], and have been updated if relevant.   Review of Systems  Constitutional: Negative.   Endocrine: Positive for heat intolerance.  All other systems reviewed and are negative.  See HPI    Objective:   Physical Exam  Constitutional: She is oriented to person, place, and time. She appears well-developed and well-nourished. No distress.  HENT:  Head: Normocephalic.  Eyes: Conjunctivae are normal.  Neck: Normal range of motion.  Cardiovascular: Normal rate.  Pulmonary/Chest: Effort normal.  Musculoskeletal: She exhibits no edema.  Neurological: She is alert and oriented to person, place, and time. No cranial nerve deficit.  Skin: Skin is warm and dry.  Psychiatric: She has a normal mood and affect. Her behavior is normal. Judgment and thought content normal.  Nursing note and vitals reviewed.  BP 126/88 mmHg  Pulse 101  Temp(Src) 98 F (36.7 C) (Oral)  Wt 189 lb (85.73 kg)  SpO2 97% Gen:  Alert, pleasant, NAD Psych:  Good eye contact.  Not anxious or depressed appearing.    Assessment & Plan:   Postmenopausal  HRT (hormone replacement therapy) >25 min spent with face to face with patient, >50% counseling and/or coordinating care Discussed risks and benefits of HRT.  Pt has a uterus and needs progesterone with estrogen to prevent endometrial hyperplasia (20-50% of patients on unopposed estrogen for 1 yr will develop hyperplasia). Start Prometrium 100 mg qhs with Premarin 0.3 mg daily. The patient indicates understanding of these issues and agrees with the plan.

## 2015-05-03 NOTE — Progress Notes (Signed)
Pre visit review using our clinic review tool, if applicable. No additional management support is needed unless otherwise documented below in the visit note. 

## 2015-05-03 NOTE — Assessment & Plan Note (Signed)
>  25 min spent with face to face with patient, >50% counseling and/or coordinating care Discussed risks and benefits of HRT.  Pt has a uterus and needs progesterone with estrogen to prevent endometrial hyperplasia (20-50% of patients on unopposed estrogen for 1 yr will develop hyperplasia).  She wants to continue HRT. eRx sent for combipatch, lower dose. Call or return to clinic prn if these symptoms worsen or fail to improve as anticipated. The patient indicates understanding of these issues and agrees with the plan.

## 2015-05-12 ENCOUNTER — Ambulatory Visit: Payer: Self-pay | Admitting: Cardiovascular Disease

## 2015-05-12 ENCOUNTER — Telehealth: Payer: Self-pay | Admitting: Cardiovascular Disease

## 2015-05-12 NOTE — Telephone Encounter (Signed)
Patient could not come today as she had no ride at short notice see additional note added to schedule and waitlist  Patient advised to seek emergency treatment if needed and to call PCP

## 2015-05-12 NOTE — Telephone Encounter (Signed)
Pt hasn't been seen since 2012.  She will need an appt. Pt is anxious and would like an appt ASAP. Added on to today at 3:00 to see Dr. Mariah Milling.

## 2015-05-12 NOTE — Telephone Encounter (Signed)
Patient has not been seen  Since 2012 added to schedule as first available new patient appt with Gollan on 06-23-15 and to waitlist.  Patient advised to seek emergency treatment if needed and to also call PCP.  RN [facility] aware

## 2015-05-12 NOTE — Telephone Encounter (Signed)
Patient c/o Palpitations:  High priority if patient c/o lightheadedness and shortness of breath.  1. How long have you been having palpitations? Years, recently worse   2. Are you currently experiencing lightheadedness and shortness of breath?  Lightheaded and SOB  3. Have you checked your BP and heart rate? (document readings) HR goes up into 120's BP usually lower   4. Are you experiencing any other symptoms? No

## 2015-05-14 ENCOUNTER — Other Ambulatory Visit: Payer: Self-pay | Admitting: Family Medicine

## 2015-05-14 NOTE — Telephone Encounter (Signed)
Rx called in to requested pharmacy 

## 2015-05-14 NOTE — Telephone Encounter (Signed)
Last f/u appt 04/2015 

## 2015-05-22 ENCOUNTER — Encounter: Payer: Self-pay | Admitting: Family Medicine

## 2015-05-24 NOTE — Telephone Encounter (Signed)
Spoke to pt and informed her to contact pharmacy to verify 90d supply is acceptable as we have not received request and 90D may not be covered for all medications. Pt verbally expressed understanding and will contact office back if available to dispense

## 2015-05-25 ENCOUNTER — Encounter: Payer: Self-pay | Admitting: Family Medicine

## 2015-05-26 MED ORDER — ESTRADIOL-NORETHINDRONE ACET 0.05-0.14 MG/DAY TD PTTW: 1.0000 | MEDICATED_PATCH | TRANSDERMAL | Status: DC

## 2015-06-02 ENCOUNTER — Other Ambulatory Visit: Payer: Self-pay | Admitting: Family Medicine

## 2015-06-06 ENCOUNTER — Other Ambulatory Visit: Payer: Self-pay | Admitting: Family Medicine

## 2015-06-07 ENCOUNTER — Encounter: Payer: BLUE CROSS/BLUE SHIELD | Admitting: Family Medicine

## 2015-06-07 NOTE — Telephone Encounter (Signed)
Resent as authorized on 06/02/15.

## 2015-06-16 ENCOUNTER — Other Ambulatory Visit: Payer: Self-pay | Admitting: Family Medicine

## 2015-06-17 NOTE — Telephone Encounter (Signed)
Rx called in to requested pharmacy 

## 2015-06-17 NOTE — Telephone Encounter (Signed)
Last f/u appt 04/2015 

## 2015-06-23 ENCOUNTER — Ambulatory Visit: Payer: Self-pay | Admitting: Cardiovascular Disease

## 2015-07-18 ENCOUNTER — Other Ambulatory Visit: Payer: Self-pay | Admitting: Family Medicine

## 2015-07-19 NOTE — Telephone Encounter (Signed)
Last f/u appt 04/2015 

## 2015-07-19 NOTE — Telephone Encounter (Signed)
Rx called in to requested pharmacy 

## 2015-08-09 ENCOUNTER — Emergency Department
Admission: EM | Admit: 2015-08-09 | Discharge: 2015-08-09 | Disposition: A | Discharge status: Home / Self Care | Payer: BLUE CROSS/BLUE SHIELD | Attending: Emergency Medicine | Admitting: Emergency Medicine

## 2015-08-09 ENCOUNTER — Encounter: Payer: Self-pay | Admitting: Emergency Medicine

## 2015-08-09 DIAGNOSIS — K529 Noninfective gastroenteritis and colitis, unspecified: Secondary | ICD-10-CM

## 2015-08-09 DIAGNOSIS — Z79899 Other long term (current) drug therapy: Secondary | ICD-10-CM | POA: Insufficient documentation

## 2015-08-09 DIAGNOSIS — R197 Diarrhea, unspecified: Secondary | ICD-10-CM | POA: Diagnosis present

## 2015-08-09 DIAGNOSIS — Z880 Allergy status to penicillin: Secondary | ICD-10-CM | POA: Diagnosis not present

## 2015-08-09 LAB — CBC WITH DIFFERENTIAL/PLATELET
Basophils Absolute: 0.1 10*3/uL (ref 0–0.1)
Basophils Relative: 1 %
Eosinophils Absolute: 0.2 10*3/uL (ref 0–0.7)
Eosinophils Relative: 4 %
HCT: 41.8 % (ref 35.0–47.0)
HEMOGLOBIN: 13.8 g/dL (ref 12.0–16.0)
LYMPHS ABS: 1.7 10*3/uL (ref 1.0–3.6)
Lymphocytes Relative: 30 %
MCH: 29.1 pg (ref 26.0–34.0)
MCHC: 33.1 g/dL (ref 32.0–36.0)
MCV: 88.1 fL (ref 80.0–100.0)
MONOS PCT: 8 %
Monocytes Absolute: 0.5 10*3/uL (ref 0.2–0.9)
NEUTROS ABS: 3.2 10*3/uL (ref 1.4–6.5)
NEUTROS PCT: 57 %
Platelets: 243 10*3/uL (ref 150–440)
RBC: 4.74 MIL/uL (ref 3.80–5.20)
RDW: 13.1 % (ref 11.5–14.5)
WBC: 5.8 10*3/uL (ref 3.6–11.0)

## 2015-08-09 LAB — URINALYSIS COMPLETE WITH MICROSCOPIC (ARMC ONLY)
BACTERIA UA: NONE SEEN
Bilirubin Urine: NEGATIVE
Glucose, UA: NEGATIVE mg/dL
Hgb urine dipstick: NEGATIVE
KETONES UR: NEGATIVE mg/dL
Leukocytes, UA: NEGATIVE
Nitrite: NEGATIVE
PROTEIN: NEGATIVE mg/dL
RBC / HPF: NONE SEEN RBC/hpf (ref 0–5)
Specific Gravity, Urine: 1.003 — ABNORMAL LOW (ref 1.005–1.030)
pH: 7 (ref 5.0–8.0)

## 2015-08-09 LAB — COMPREHENSIVE METABOLIC PANEL
ALK PHOS: 61 U/L (ref 38–126)
ALT: 14 U/L (ref 14–54)
AST: 28 U/L (ref 15–41)
Albumin: 4.6 g/dL (ref 3.5–5.0)
Anion gap: 9 (ref 5–15)
BUN: 10 mg/dL (ref 6–20)
CALCIUM: 9.3 mg/dL (ref 8.9–10.3)
CO2: 24 mmol/L (ref 22–32)
Chloride: 105 mmol/L (ref 101–111)
Creatinine, Ser: 0.96 mg/dL (ref 0.44–1.00)
GFR calc Af Amer: >60 mL/min (ref >60)
Glucose, Bld: 98 mg/dL (ref 65–99)
Potassium: 3.7 mmol/L (ref 3.5–5.1)
Sodium: 138 mmol/L (ref 135–145)
TOTAL PROTEIN: 8 g/dL (ref 6.5–8.1)
Total Bilirubin: 0.6 mg/dL (ref 0.3–1.2)

## 2015-08-09 MED ORDER — HYDROMORPHONE HCL 1 MG/ML IJ SOLN
0.5000 mg | Freq: Once | INTRAMUSCULAR | Status: AC
  Administered 2015-08-09: 0.5 mg via INTRAVENOUS
  Filled 2015-08-09: qty 1

## 2015-08-09 MED ORDER — PROMETHAZINE HCL 25 MG RE SUPP
25.0000 mg | Freq: Four times a day (QID) | RECTAL | Status: DC | PRN
Start: 2015-08-09 — End: 2015-09-30

## 2015-08-09 MED ORDER — DOXYCYCLINE HYCLATE 100 MG PO TBEC: 100.0000 mg | DELAYED_RELEASE_TABLET | Freq: Two times a day (BID) | ORAL | Status: AC

## 2015-08-09 MED ORDER — DOXYCYCLINE MONOHYDRATE 100 MG PO TABS: 100.0000 mg | ORAL_TABLET | Freq: Two times a day (BID) | ORAL | Status: DC

## 2015-08-09 MED ORDER — ONDANSETRON HCL 4 MG/2ML IJ SOLN
4.0000 mg | Freq: Once | INTRAMUSCULAR | Status: AC
  Administered 2015-08-09: 4 mg via INTRAVENOUS
  Filled 2015-08-09: qty 2

## 2015-08-09 MED ORDER — SODIUM CHLORIDE 0.9 % IV BOLUS (SEPSIS)
1000.0000 mL | Freq: Once | INTRAVENOUS | Status: AC
  Administered 2015-08-09: 1000 mL via INTRAVENOUS

## 2015-08-09 NOTE — ED Provider Notes (Signed)
Time Seen: Approximately ----------------------------------------- 11:54 AM on 08/09/2015 -----------------------------------------    I have reviewed the triage notes  Chief Complaint: Diarrhea   History of Present Illness: Margaret Wood is a 54 y.o. female who states that she was recently visiting in [place] states now when she developed some nausea, vomiting, loose stool. Patient states she had the initiation of symptoms and was seen on Thursday. She was discharged on Zofran and states that she took some Zofran today and did not seem to offer any assistance. She states she was "" bound up"" until 24 hours ago and started developing numerous episodes of loose stool. She denies any abdominal pain. She states her was a very small amount of bright red blood in her stool. She denies any rectal pain. She states that she has had a recent tick bite approximately 2 weeks ago that occurred on her left upper shoulder. This tick bite did develop a rash and her husband states that it may have been a target in appearance rash. She has had some intermittent headaches which seemed to resolve. She denies any persistent neck pain stiffness, photophobia, back pain. She denies any other rashes or lesions. She states her tick bite occurred here in [place] and curb prior to her trip to [place].   Past Medical History  Diagnosis Date  . Palpitations   . Fibromyalgia   . Scoliosis   . Arthritis   . Depression     Patient Active Problem List   Diagnosis Date Noted  . Postmenopausal HRT (hormone replacement therapy) 05/03/2015  . MDD (major depressive disorder), recurrent severe, without psychosis 07/24/2013  . GAD (generalized anxiety disorder) 07/24/2013  . Suicidal ideation 07/24/2013  . External hemorrhoid, bleeding 01/22/2013  . Symptoms, such as flushing, sleeplessness, headache, lack of concentration, associated with the menopause 05/29/2012  . Fibromyalgia   . Scoliosis   .  SCOLIOSIS, LUMBAR SPINE 10/07/2010  . UNSPECIFIED ARTHROPATHY MULTIPLE SITES 04/27/2008    Past Surgical History  Procedure Laterality Date  . Appendectomy  1980  . Cesarean section  1995  . Cholecystectomy  1997    Past Surgical History  Procedure Laterality Date  . Appendectomy  1980  . Cesarean section  1995  . Cholecystectomy  1997    Current Outpatient Rx  Name  Route  Sig  Dispense  Refill  . ALPRAZolam (XANAX) 0.25 MG tablet      TAKE 1 TABLET BY MOUTH TWICE A DYA   60 tablet   0     Not to exceed 5 additional fills before 12/14/2015   . cyclobenzaprine (FLEXERIL) 10 MG tablet      TAKE 1 TABLET BY MOUTH AT BEDTIME   30 tablet   2   . dicyclomine (BENTYL) 10 MG capsule      TAKE 1 CAPSULE BY MOUTH EVERY 6 HOURS AS NEEDED (CRAMPING).   90 capsule   0   . dicyclomine (BENTYL) 10 MG capsule      TAKE 1 CAPSULE BY MOUTH EVERY 6 HOURS AS NEEDED (CRAMPING).   90 capsule   0   . estradiol-norethindrone (COMBIPATCH) 0.05-0.14 MG/DAY   Transdermal   Place 1 patch onto the skin 2 (two) times a week.   24 patch   1   . metoprolol tartrate (LOPRESSOR) 25 MG tablet      TAKE 1 TABLET BY MOUTH TWICE A DAY   180 tablet   0     Office visit required  for additional refills   . pantoprazole (PROTONIX) 40 MG tablet   Oral   Take 1 tablet (40 mg total) by mouth as needed. For GERD.   90 tablet   0   . promethazine (PHENERGAN) 12.5 MG tablet   Oral   Take 1 tablet (12.5 mg total) by mouth every 8 (eight) hours as needed for nausea or vomiting.   20 tablet   0   . traMADol (ULTRAM) 50 MG tablet      TAKE 1 TABLET BY MOUTH EVERY 8 HOURS   60 tablet   0     Not to exceed 5 additional fills before 12/14/2015     Allergies:  Amoxicillin; Cymbalta; Nsaids; and Tegretol  Family History: History reviewed. No pertinent family history.  Social History: Social History  Substance Use Topics  . Smoking status: Never Smoker   . Smokeless tobacco: Never  Used  . Alcohol Use: No     Review of Systems:   10 point review of systems was performed and was otherwise negative:  Constitutional: No fever Eyes: No visual disturbances ENT: No sore throat, ear pain Cardiac: No chest pain Respiratory: No shortness of breath, wheezing, or stridor Abdomen: No abdominal pain, no vomiting, No diarrhea Endocrine: No weight loss, No night sweats Extremities: No peripheral edema, cyanosis Skin: No rashes, easy bruising Neurologic: No focal weakness, trouble with speech or swollowing Urologic: No dysuria, Hematuria, or urinary frequency   Physical Exam:  ED Triage Vitals  Enc Vitals Group     BP 08/09/15 0948 136/90 mmHg     Pulse Rate 08/09/15 0948 96     Resp 08/09/15 0948 20     Temp 08/09/15 0948 98 F (36.7 C)     Temp Source 08/09/15 0948 Oral     SpO2 08/09/15 0948 98 %     Weight 08/09/15 0948 184 lb (83.462 kg)     Height 08/09/15 0948 5\' 4"  (1.626 m)     Head Cir --      Peak Flow --      Pain Score 08/09/15 0949 1     Pain Loc --      Pain Edu? --      Excl. in GC? --     General: Awake , Alert , and Oriented times 3; GCS 15 Head: Normal cephalic , atraumatic Eyes: Pupils equal , round, reactive to light. No photophobia Nose/Throat: No nasal drainage, patent upper airway without erythema or exudate. Dry mucous membranes in the oral cavity Neck: Supple, Full range of motion, No anterior adenopathy or palpable thyroid masses Lungs: Clear to ascultation without wheezes , rhonchi, or rales Heart: Regular rate, regular rhythm without murmurs , gallops , or rubs Abdomen: Soft, non tender without rebound, guarding , or rigidity; bowel sounds positive and symmetric in all 4 quadrants. No organomegaly .        Extremities: 2 plus symmetric pulses. No edema, clubbing or cyanosis Neurologic: normal ambulation, Motor symmetric without deficits, sensory intact Skin: Patient has a small punctate lesion on the left scapular region.  Currently there is no rash surrounding this area.   Labs:   All laboratory work was reviewed including any pertinent negatives or positives listed below:  Labs Reviewed  CBC WITH DIFFERENTIAL/PLATELET  COMPREHENSIVE METABOLIC PANEL  URINALYSIS COMPLETEWITH MICROSCOPIC ([facility] ONLY)     ED Course: Patient's stay here was uneventful and she showed symptomatic improvement. Patient received an IV bolus of fluids and was given IV  Zofran and was able tolerate oral intake. She doesn't have any headache here currently and I doubt that that she is having meningitis, encephalitis, or any other life-threatening cause for her headache. Her main concerns seem to be over nausea vomiting and a recent tick bite. She is afebrile and does not have a typical rash of [facility] spotted fever. She may be at risk for Lyme's disease and was placed on oral antibiotic therapy patient was given a prescription for doxycycline and was advised of the side effects of the medication she was also given a prescription for Phenergan suppositories as she states of by mouth Phenergan seems to help at home however when she is vomiting it's difficult to take. Patient was discharged home with her husband and advised especially return here if she develops a fever, focal abdominal pain, persistent blood in her stool, or any other new concerns.   Assessment:  Gastroenteritis Recent tick bite     Plan:  Patient was advised to return immediately if condition worsens. Patient was advised to follow up with her primary care physician or other specialized physicians involved and in their current assessment.            Jennye Moccasin, MD 08/09/15 [phone]

## 2015-08-09 NOTE — ED Notes (Signed)
Reports nausea, diarrhea and ha.  Seen in [place] at hospital on Thursday while on vacation.  staes they dx her with food poison.  States she is concerned bc she was bit by a tick prior to going on vacation.

## 2015-08-09 NOTE — Discharge Instructions (Signed)

## 2015-08-10 ENCOUNTER — Emergency Department
Admission: EM | Admit: 2015-08-10 | Discharge: 2015-08-10 | Disposition: A | Discharge status: Home / Self Care | Payer: BLUE CROSS/BLUE SHIELD | Attending: Emergency Medicine | Admitting: Emergency Medicine

## 2015-08-10 ENCOUNTER — Emergency Department: Payer: BLUE CROSS/BLUE SHIELD

## 2015-08-10 ENCOUNTER — Telehealth: Payer: Self-pay | Admitting: Family Medicine

## 2015-08-10 ENCOUNTER — Encounter: Payer: Self-pay | Admitting: Emergency Medicine

## 2015-08-10 ENCOUNTER — Encounter: Payer: Self-pay | Admitting: Family Medicine

## 2015-08-10 DIAGNOSIS — R109 Unspecified abdominal pain: Secondary | ICD-10-CM | POA: Insufficient documentation

## 2015-08-10 DIAGNOSIS — R000 Tachycardia, unspecified: Secondary | ICD-10-CM | POA: Insufficient documentation

## 2015-08-10 DIAGNOSIS — Z79899 Other long term (current) drug therapy: Secondary | ICD-10-CM | POA: Insufficient documentation

## 2015-08-10 DIAGNOSIS — R112 Nausea with vomiting, unspecified: Secondary | ICD-10-CM

## 2015-08-10 DIAGNOSIS — Z792 Long term (current) use of antibiotics: Secondary | ICD-10-CM | POA: Diagnosis not present

## 2015-08-10 DIAGNOSIS — R51 Headache: Secondary | ICD-10-CM | POA: Insufficient documentation

## 2015-08-10 DIAGNOSIS — R509 Fever, unspecified: Secondary | ICD-10-CM | POA: Insufficient documentation

## 2015-08-10 DIAGNOSIS — R519 Headache, unspecified: Secondary | ICD-10-CM

## 2015-08-10 LAB — CBC WITH DIFFERENTIAL/PLATELET
Basophils Absolute: 0 10*3/uL (ref 0–0.1)
Basophils Relative: 1 %
Eosinophils Absolute: 0 10*3/uL (ref 0–0.7)
Eosinophils Relative: 1 %
HEMATOCRIT: 43.2 % (ref 35.0–47.0)
HEMOGLOBIN: 14.4 g/dL (ref 12.0–16.0)
LYMPHS PCT: 19 %
Lymphs Abs: 1.3 10*3/uL (ref 1.0–3.6)
MCH: 29.3 pg (ref 26.0–34.0)
MCHC: 33.3 g/dL (ref 32.0–36.0)
MCV: 88.1 fL (ref 80.0–100.0)
MONO ABS: 0.3 10*3/uL (ref 0.2–0.9)
MONOS PCT: 5 %
NEUTROS ABS: 5.2 10*3/uL (ref 1.4–6.5)
NEUTROS PCT: 76 %
Platelets: 255 10*3/uL (ref 150–440)
RBC: 4.91 MIL/uL (ref 3.80–5.20)
RDW: 13.4 % (ref 11.5–14.5)
WBC: 6.9 10*3/uL (ref 3.6–11.0)

## 2015-08-10 LAB — COMPREHENSIVE METABOLIC PANEL
ALK PHOS: 63 U/L (ref 38–126)
ALT: 16 U/L (ref 14–54)
ANION GAP: 10 (ref 5–15)
AST: 28 U/L (ref 15–41)
Albumin: 4.9 g/dL (ref 3.5–5.0)
BILIRUBIN TOTAL: 1.7 mg/dL — AB (ref 0.3–1.2)
BUN: 7 mg/dL (ref 6–20)
CALCIUM: 9.8 mg/dL (ref 8.9–10.3)
CO2: 25 mmol/L (ref 22–32)
Chloride: 105 mmol/L (ref 101–111)
Creatinine, Ser: 1.02 mg/dL — ABNORMAL HIGH (ref 0.44–1.00)
GFR calc Af Amer: >60 mL/min (ref >60)
Glucose, Bld: 98 mg/dL (ref 65–99)
POTASSIUM: 3.8 mmol/L (ref 3.5–5.1)
Sodium: 140 mmol/L (ref 135–145)
TOTAL PROTEIN: 8.3 g/dL — AB (ref 6.5–8.1)

## 2015-08-10 LAB — URINALYSIS COMPLETE WITH MICROSCOPIC (ARMC ONLY)
BACTERIA UA: NONE SEEN
BILIRUBIN URINE: NEGATIVE
GLUCOSE, UA: NEGATIVE mg/dL
Hgb urine dipstick: NEGATIVE
LEUKOCYTES UA: NEGATIVE
Nitrite: NEGATIVE
PH: 6 (ref 5.0–8.0)
Protein, ur: NEGATIVE mg/dL
RBC / HPF: NONE SEEN RBC/hpf (ref 0–5)
Specific Gravity, Urine: 1.006 (ref 1.005–1.030)

## 2015-08-10 LAB — LIPASE, BLOOD: LIPASE: 25 U/L (ref 22–51)

## 2015-08-10 MED ORDER — ONDANSETRON 4 MG PO TBDP: 4.0000 mg | ORAL_TABLET | Freq: Three times a day (TID) | ORAL | Status: DC | PRN

## 2015-08-10 MED ORDER — DOXYCYCLINE HYCLATE 100 MG IV SOLR
100.0000 mg | Freq: Once | INTRAVENOUS | Status: AC
  Administered 2015-08-10: 100 mg via INTRAVENOUS
  Filled 2015-08-10: qty 100

## 2015-08-10 MED ORDER — SODIUM CHLORIDE 0.9 % IV BOLUS (SEPSIS)
500.0000 mL | Freq: Once | INTRAVENOUS | Status: AC
  Administered 2015-08-10: 500 mL via INTRAVENOUS

## 2015-08-10 MED ORDER — DEXTROSE-NACL 5-0.9 % IV SOLN
Freq: Once | INTRAVENOUS | Status: AC
  Administered 2015-08-10: 16:00:00 via INTRAVENOUS

## 2015-08-10 MED ORDER — ONDANSETRON HCL 4 MG/2ML IJ SOLN
4.0000 mg | Freq: Once | INTRAMUSCULAR | Status: AC
  Administered 2015-08-10: 4 mg via INTRAVENOUS
  Filled 2015-08-10: qty 2

## 2015-08-10 MED ORDER — METOCLOPRAMIDE HCL 5 MG PO TABS: 5.0000 mg | ORAL_TABLET | Freq: Three times a day (TID) | ORAL | Status: DC

## 2015-08-10 MED ORDER — SODIUM CHLORIDE 0.9 % IV BOLUS (SEPSIS): 500.0000 mL | Freq: Once | INTRAVENOUS | Status: DC

## 2015-08-10 MED ORDER — METOCLOPRAMIDE HCL 5 MG/ML IJ SOLN
5.0000 mg | Freq: Once | INTRAMUSCULAR | Status: AC
  Administered 2015-08-10: 5 mg via INTRAVENOUS
  Filled 2015-08-10: qty 1

## 2015-08-10 MED ORDER — MAGNESIUM SULFATE 2 GM/50ML IV SOLN
2.0000 g | Freq: Once | INTRAVENOUS | Status: AC
  Administered 2015-08-10: 2 g via INTRAVENOUS
  Filled 2015-08-10: qty 50

## 2015-08-10 NOTE — Discharge Instructions (Signed)
It is not clear what is causing this headache, nausea, and vomiting. Your blood tests look okay. The CT scan of her head today was normal. We have discussed the likelihood that this is caused by an illness from a tick bite. You're feeling much better after Zofran, Reglan, and magnesium. You're also treated with 1 dose of doxycycline IV. Continue with doxycycline and use Zofran as needed for nausea. Blood tests to assess for recommended spotted fever or Lyme disease are pending. Follow-up with your regular doctor this week if possible or early next week at the latest. Return to the emergency department if you feel worse, cannot eat or drink, or have other urgent concerns.  General Headache Without Cause A headache is pain or discomfort felt around the head or neck area. The specific cause of a headache may not be found. There are many causes and types of headaches. A few common ones are:  Tension headaches.  Migraine headaches.  Cluster headaches.  Chronic daily headaches. HOME CARE INSTRUCTIONS   Keep all follow-up appointments with your caregiver or any specialist referral.  Only take over-the-counter or prescription medicines for pain or discomfort as directed by your caregiver.  Lie down in a dark, quiet room when you have a headache.  Keep a headache journal to find out what may trigger your migraine headaches. For example, write down:  What you eat and drink.  How much sleep you get.  Any change to your diet or medicines.  Try massage or other relaxation techniques.  Put ice packs or heat on the head and neck. Use these 3 to 4 times per day for 15 to 20 minutes each time, or as needed.  Limit stress.  Sit up straight, and do not tense your muscles.  Quit smoking if you smoke.  Limit alcohol use.  Decrease the amount of caffeine you drink, or stop drinking caffeine.  Eat and sleep on a regular schedule.  Get 7 to 9 hours of sleep, or as recommended by your  caregiver.  Keep lights dim if bright lights bother you and make your headaches worse. SEEK MEDICAL CARE IF:   You have problems with the medicines you were prescribed.  Your medicines are not working.  You have a change from the usual headache.  You have nausea or vomiting. SEEK IMMEDIATE MEDICAL CARE IF:   Your headache becomes severe.  You have a fever.  You have a stiff neck.  You have loss of vision.  You have muscular weakness or loss of muscle control.  You start losing your balance or have trouble walking.  You feel faint or pass out.  You have severe symptoms that are different from your first symptoms. MAKE SURE YOU:   Understand these instructions.  Will watch your condition.  Will get help right away if you are not doing well or get worse. Document Released: 11/13/2005 Document Revised: 02/05/2012 Document Reviewed: 11/29/2011 [facility] Patient Information 2015 [place], [place]. This information is not intended to replace advice given to you by your health care provider. Make sure you discuss any questions you have with your health care provider.  Nausea and Vomiting Nausea means you feel sick to your stomach. Throwing up (vomiting) is a reflex where stomach contents come out of your mouth. HOME CARE   Take medicine as told by your doctor.  Do not force yourself to eat. However, you do need to drink fluids.  If you feel like eating, eat a normal diet as told  by your doctor.  Eat rice, wheat, potatoes, bread, lean meats, yogurt, fruits, and vegetables.  Avoid high-fat foods.  Drink enough fluids to keep your pee (urine) clear or pale yellow.  Ask your doctor how to replace body fluid losses (rehydrate). Signs of body fluid loss (dehydration) include:  Feeling very thirsty.  Dry lips and mouth.  Feeling dizzy.  Dark pee.  Peeing less than normal.  Feeling confused.  Fast breathing or heart rate. GET HELP RIGHT AWAY IF:   You have blood  in your throw up.  You have black or bloody poop (stool).  You have a bad headache or stiff neck.  You feel confused.  You have bad belly (abdominal) pain.  You have chest pain or trouble breathing.  You do not pee at least once every 8 hours.  You have cold, clammy skin.  You keep throwing up after 24 to 48 hours.  You have a fever. MAKE SURE YOU:   Understand these instructions.  Will watch your condition.  Will get help right away if you are not doing well or get worse. Document Released: 05/01/2008 Document Revised: 02/05/2012 Document Reviewed: 04/14/2011 [facility] Patient Information 2015 [place], [place]. This information is not intended to replace advice given to you by your health care provider. Make sure you discuss any questions you have with your health care provider.

## 2015-08-10 NOTE — Telephone Encounter (Signed)
Patient Name: Margaret Wood DOB: Oct 25, 1961 Initial Comment Caller states, 9/8 wife went to ER with extreme headache and extreme nausea , was treated and released. Yesterday, 9/12, this happened again, which sent her to ER. Today extreme headache and extreme nausea happened again. Nurse Assessment Nurse: Charna Elizabeth, RN, Cathy Date/Time (Eastern Time): 08/10/2015 10:22:54 AM Confirm and document reason for call. If symptomatic, describe symptoms. ---Caller states that his wife was seen in the ER yesterday for a headache and nausea. The headache and nausea have returned again today. She developed fever yesterday (estimates her current temperature to be about 99.0). No stiffness in her neck. No injury in the past 3 days. Has the patient traveled out of the country within the last 30 days? ---No Does the patient require triage? ---Yes Related visit to physician within the last 2 weeks? ---Yes Does the PT have any chronic conditions? (i.e. diabetes, asthma, etc.) ---Yes List chronic conditions. ---Fibromyalgia, Heart Valve Prolapse, Irregular heart rhythms, Tick bite 14 days ago Did the patient indicate they were pregnant? ---No Guidelines Guideline Title Affirmed Question Affirmed Notes Headache Unable to walk, or can only walk with assistance (e.g., requires support) Final Disposition User Go to ED Now Charna Elizabeth, RN, [employer] Referrals [facility] - ED Disagree/Comply: Comply

## 2015-08-10 NOTE — ED Notes (Signed)
Patient reports N/V, HA since Thursday. Noticed tick on her back during that time period. Reports low grade fever as well.

## 2015-08-10 NOTE — Telephone Encounter (Signed)
According to the pts chart, she is currently at the ED

## 2015-08-10 NOTE — ED Notes (Signed)
Pt taking sips cola at request and MD request for PO challenge

## 2015-08-10 NOTE — ED Provider Notes (Signed)
[facility] Emergency Department Provider Note  ____________________________________________  Time seen: 1515  I have reviewed the triage vital signs and the nursing notes.   HISTORY  Chief Complaint Abdominal Pain  refractory nausea vomiting    HPI Margaret Wood is a 54 y.o. female who was seen in the emergency department yesterday and also seen in an emergency department in [place] on September 8 for similar problems.  She has been experiencing nausea and vomiting for the past 5 days. She's also had a bad headache and some subjective fever. She now has developed some abdominal pain as well.  She was concerned about tick exposure. She was started on doxycycline. Despite prescription antinausea medicine and doxycycline, the patient has continued to vomit over the past 2-3 days.   The patient took a 4 mg Zofran ODT this morning, then repeated at approximately one hour later. She had no relief with that. She then took 2 Phenergan tablets. She reports these stay down but offered only minimal relief.  She tells me that recommended spotted fever test were ordered here yesterday, although I do not see any evidence of a result or in order. She was able to take her doxycycline yesterday that not today.     Past Medical History  Diagnosis Date  . Palpitations   . Fibromyalgia   . Scoliosis   . Arthritis   . Depression     Patient Active Problem List   Diagnosis Date Noted  . Postmenopausal HRT (hormone replacement therapy) 05/03/2015  . MDD (major depressive disorder), recurrent severe, without psychosis 07/24/2013  . GAD (generalized anxiety disorder) 07/24/2013  . Suicidal ideation 07/24/2013  . External hemorrhoid, bleeding 01/22/2013  . Symptoms, such as flushing, sleeplessness, headache, lack of concentration, associated with the menopause 05/29/2012  . Fibromyalgia   . Scoliosis   . SCOLIOSIS, LUMBAR SPINE 10/07/2010  . UNSPECIFIED  ARTHROPATHY MULTIPLE SITES 04/27/2008    Past Surgical History  Procedure Laterality Date  . Appendectomy  1980  . Cesarean section  1995  . Cholecystectomy  1997    Current Outpatient Rx  Name  Route  Sig  Dispense  Refill  . ALPRAZolam (XANAX) 0.25 MG tablet      TAKE 1 TABLET BY MOUTH TWICE A DYA   60 tablet   0     Not to exceed 5 additional fills before 12/14/2015   . cyclobenzaprine (FLEXERIL) 10 MG tablet      TAKE 1 TABLET BY MOUTH AT BEDTIME   30 tablet   2   . dicyclomine (BENTYL) 10 MG capsule      TAKE 1 CAPSULE BY MOUTH EVERY 6 HOURS AS NEEDED (CRAMPING).   90 capsule   0   . dicyclomine (BENTYL) 10 MG capsule      TAKE 1 CAPSULE BY MOUTH EVERY 6 HOURS AS NEEDED (CRAMPING).   90 capsule   0   . doxycycline (ADOXA) 100 MG tablet   Oral   Take 1 tablet (100 mg total) by mouth 2 (two) times daily.   14 tablet   0   . doxycycline (DORYX) 100 MG EC tablet   Oral   Take 1 tablet (100 mg total) by mouth 2 (two) times daily.   14 tablet   0   . estradiol-norethindrone (COMBIPATCH) 0.05-0.14 MG/DAY   Transdermal   Place 1 patch onto the skin 2 (two) times a week.   24 patch   1   . metoCLOPramide (REGLAN)  5 MG tablet   Oral   Take 1 tablet (5 mg total) by mouth 3 (three) times daily.   15 tablet   0   . metoprolol tartrate (LOPRESSOR) 25 MG tablet      TAKE 1 TABLET BY MOUTH TWICE A DAY   180 tablet   0     Office visit required for additional refills   . ondansetron (ZOFRAN ODT) 4 MG disintegrating tablet   Oral   Take 1 tablet (4 mg total) by mouth every 8 (eight) hours as needed for nausea or vomiting.   10 tablet   0   . pantoprazole (PROTONIX) 40 MG tablet   Oral   Take 1 tablet (40 mg total) by mouth as needed. For GERD.   90 tablet   0   . promethazine (PHENERGAN) 12.5 MG tablet   Oral   Take 1 tablet (12.5 mg total) by mouth every 8 (eight) hours as needed for nausea or vomiting.   20 tablet   0   . promethazine  (PHENERGAN) 25 MG suppository   Rectal   Place 1 suppository (25 mg total) rectally every 6 (six) hours as needed for nausea or vomiting.   12 each   0   . traMADol (ULTRAM) 50 MG tablet      TAKE 1 TABLET BY MOUTH EVERY 8 HOURS   60 tablet   0     Not to exceed 5 additional fills before 12/14/2015     Allergies Amoxicillin; Cymbalta; Nsaids; and Tegretol  History reviewed. No pertinent family history.  Social History Social History  Substance Use Topics  . Smoking status: Never Smoker   . Smokeless tobacco: Never Used  . Alcohol Use: No    Review of Systems  Constitutional: Positive for subjective fever ENT: Negative for sore throat. Cardiovascular: Negative for chest pain. Respiratory: Negative for shortness of breath. Gastrointestinal: Refractory nausea vomiting with abdominal pain Genitourinary: Negative for dysuria. Musculoskeletal: No myalgias or injuries. Skin: Negative for rash. Neurological: Positive for headaches without meningismus   10-point ROS otherwise negative.  ____________________________________________   PHYSICAL EXAM:  VITAL SIGNS: ED Triage Vitals  Enc Vitals Group     BP 08/10/15 1144 134/88 mmHg     Pulse Rate 08/10/15 1144 111     Resp 08/10/15 1144 20     Temp 08/10/15 1144 98.1 F (36.7 C)     Temp Source 08/10/15 1144 Oral     SpO2 08/10/15 1144 98 %     Weight 08/10/15 1144 184 lb (83.462 kg)     Height 08/10/15 1144  (1.626 m)     Head Cir --      Peak Flow --      Pain Score 08/10/15 1150 5     Pain Loc --      Pain Edu? --      Excl. in GC? --     Constitutional:  Alert and oriented. Some active emesis on my exam. She looks uncomfortable.  ENT   Head: Normocephalic and atraumatic.      Cervical: No meningismus present   Nose: No congestion/rhinnorhea. Cardiovascular: Tachycardic at 105, regular rhythm, no murmur noted Respiratory:  Normal respiratory effort, no tachypnea.    Breath sounds are clear  and equal bilaterally.  Gastrointestinal: Soft. No distention. Moderate diffuse tenderness in the upper abdomen. Back: No muscle spasm, no tenderness, no CVA tenderness. Musculoskeletal: No deformity noted. Nontender with normal range of motion in all  extremities.  No noted edema. Neurologic:  Normal speech and language. 5 out of 5 strength in all 4 extremities, equal grip strength, negative pronator drift. No gross focal neurologic deficits are appreciated.  Skin:  Skin is warm, dry. No rash noted. Psychiatric: Mood and affect are normal. Speech and behavior are normal.  ____________________________________________    LABS (pertinent positives/negatives)  Labs Reviewed  COMPREHENSIVE METABOLIC PANEL - Abnormal; Notable for the following:    Creatinine, Ser 1.02 (*)    Total Protein 8.3 (*)    Total Bilirubin 1.7 (*)    All other components within normal limits  URINALYSIS COMPLETEWITH MICROSCOPIC ([facility] ONLY) - Abnormal; Notable for the following:    Color, Urine YELLOW (*)    APPearance CLEAR (*)    Ketones, ur 1+ (*)    Squamous Epithelial / LPF 0-5 (*)    All other components within normal limits  CBC WITH DIFFERENTIAL/PLATELET  LIPASE, BLOOD  B. BURGDORFI ANTIBODIES  ROCKY MTN SPOTTED FVR ABS PNL(IGG+IGM)     ____________________________________________   RADIOLOGY  CT head:   KUB: ____________________________________________   PROCEDURES    ____________________________________________   INITIAL IMPRESSION / ASSESSMENT AND PLAN / ED COURSE  Pertinent labs & imaging results that were available during my care of the patient were reviewed by me and considered in my medical decision making (see chart for details).  Uncomfortable somewhat ill-appearing 54 year old female with some distress due to refractory nausea vomiting. She has no meningismus, but I am concerned about the ongoing headache in relation to the refractory nausea vomiting. This may be due to  recommend spotted fever. I do not see evidence that these tests were ordered and I have now ordered them today. She has been on doxycycline. She has not been able to keep that down. The patient and I discussed this and agreed on a dose of doxycycline IV currently.  Due to the headache with refractory nausea vomiting we will get a head CT as well. Her also checking KUB to be sure that there is not an obstructive process present. I think this is extremely unlikely.  She'll receive 1 L of D5 normal saline, Zofran, and we are also treating her with Reglan and magnesium in case this is an atypical migraine.  ----------------------------------------- 4:57 PM on 08/10/2015 -----------------------------------------  CT is negative. KUB is negative.  Reassessment finds her feeling better with less nausea. She feels a little fatigued, which does not surprise me given that we have helped the symptoms ease.  ----------------------------------------- 7:21 PM on 08/10/2015 -----------------------------------------  Patient is feeling better but still not completely well. Her nausea is waxing and waning. She still has a bit of a headache. She is undergoing a by mouth challenge at this time.  ----------------------------------------- 8:41 PM on 08/10/2015 -----------------------------------------  Patient is now feeling significantly better. She is able to tolerate fluids by mouth. Her headache is feeling better. Throughout her stay, it is been unclear whether she would be able to go home or if she would need his stay in the hospital for refractory nausea vomiting and headache. She reports that she would prefer to go home and, with the current improvement, I think it's reasonable.  We will treat her with 500 mL more of normal saline, continue oral intake currently, and reassess again ____________________________________________   FINAL CLINICAL IMPRESSION(S) / ED DIAGNOSES  Final diagnoses:   Refractory nausea and vomiting  Acute intractable headache, unspecified headache type      Darien Ramus, MD  08/12/15 0002 

## 2015-08-10 NOTE — ED Notes (Signed)
Pt to ed with c/o right upper abd pain since Thursday.  Pt states she has been in ED x 3 since then.  Pt reports abd pain, nausea, vomiting, and diarrhea.  Pt states she has tried phenergan and zofran this am and has not been able to stop vomiting this am.

## 2015-08-10 NOTE — Telephone Encounter (Signed)
Please call to check on pt. 

## 2015-08-10 NOTE — ED Notes (Signed)
Unable to draw blood at this time, RN notified.

## 2015-08-11 LAB — B. BURGDORFI ANTIBODIES: B burgdorferi Ab IgG+IgM: <0.91 {ISR} (ref 0.00–0.90)

## 2015-08-12 LAB — ROCKY MTN SPOTTED FVR ABS PNL(IGG+IGM)
RMSF IGM: 1.18 {index} — AB (ref 0.00–0.89)
RMSF IgG: POSITIVE — AB

## 2015-08-12 LAB — RMSF, IGG, IFA

## 2015-08-13 ENCOUNTER — Encounter: Payer: Self-pay | Admitting: Family Medicine

## 2015-08-20 ENCOUNTER — Other Ambulatory Visit: Payer: Self-pay | Admitting: Family Medicine

## 2015-08-24 NOTE — Telephone Encounter (Signed)
Rx called in to requested pharmacy 

## 2015-08-24 NOTE — Telephone Encounter (Signed)
Last f/u 04/2015 

## 2015-08-25 ENCOUNTER — Telehealth: Payer: Self-pay | Admitting: Family Medicine

## 2015-08-25 NOTE — Telephone Encounter (Signed)
Spouse dropped off fmla paperwork In dr [place] IN BOX For review and signature

## 2015-08-26 DIAGNOSIS — Z7689 Persons encountering health services in other specified circumstances: Secondary | ICD-10-CM

## 2015-08-26 NOTE — Telephone Encounter (Signed)
Spouse took original to have spouse sign  He will bring back to be faxed

## 2015-08-26 NOTE — Telephone Encounter (Signed)
Forms completed and returned to Robin.  

## 2015-08-27 NOTE — Telephone Encounter (Signed)
Spouse aware copy has been faxed Copy to pt Copy to scan Copy to bill Copy to file

## 2015-09-30 ENCOUNTER — Emergency Department
Admission: EM | Admit: 2015-09-30 | Discharge: 2015-09-30 | Disposition: A | Discharge status: Home / Self Care | Payer: BLUE CROSS/BLUE SHIELD | Attending: Emergency Medicine | Admitting: Emergency Medicine

## 2015-09-30 ENCOUNTER — Encounter: Payer: Self-pay | Admitting: Intensive Care

## 2015-09-30 ENCOUNTER — Ambulatory Visit: Payer: Self-pay | Admitting: Family Medicine

## 2015-09-30 DIAGNOSIS — F419 Anxiety disorder, unspecified: Secondary | ICD-10-CM | POA: Insufficient documentation

## 2015-09-30 DIAGNOSIS — Z792 Long term (current) use of antibiotics: Secondary | ICD-10-CM | POA: Diagnosis not present

## 2015-09-30 DIAGNOSIS — Z880 Allergy status to penicillin: Secondary | ICD-10-CM | POA: Insufficient documentation

## 2015-09-30 DIAGNOSIS — Z0289 Encounter for other administrative examinations: Secondary | ICD-10-CM

## 2015-09-30 DIAGNOSIS — M791 Myalgia: Secondary | ICD-10-CM | POA: Diagnosis not present

## 2015-09-30 DIAGNOSIS — Z79899 Other long term (current) drug therapy: Secondary | ICD-10-CM | POA: Diagnosis not present

## 2015-09-30 DIAGNOSIS — K2970 Gastritis, unspecified, without bleeding: Secondary | ICD-10-CM | POA: Insufficient documentation

## 2015-09-30 DIAGNOSIS — R000 Tachycardia, unspecified: Secondary | ICD-10-CM

## 2015-09-30 DIAGNOSIS — R112 Nausea with vomiting, unspecified: Secondary | ICD-10-CM | POA: Diagnosis present

## 2015-09-30 LAB — COMPREHENSIVE METABOLIC PANEL
ALBUMIN: 4.4 g/dL (ref 3.5–5.0)
ALT: 21 U/L (ref 14–54)
AST: 28 U/L (ref 15–41)
Alkaline Phosphatase: 109 U/L (ref 38–126)
Anion gap: 9 (ref 5–15)
BUN: 11 mg/dL (ref 6–20)
CHLORIDE: 105 mmol/L (ref 101–111)
CO2: 22 mmol/L (ref 22–32)
Calcium: 9.3 mg/dL (ref 8.9–10.3)
Creatinine, Ser: 0.87 mg/dL (ref 0.44–1.00)
GFR calc Af Amer: >60 mL/min (ref >60)
GLUCOSE: 110 mg/dL — AB (ref 65–99)
POTASSIUM: 3.5 mmol/L (ref 3.5–5.1)
Sodium: 136 mmol/L (ref 135–145)
Total Bilirubin: 0.9 mg/dL (ref 0.3–1.2)
Total Protein: 8.2 g/dL — ABNORMAL HIGH (ref 6.5–8.1)

## 2015-09-30 LAB — CBC
HCT: 41.7 % (ref 35.0–47.0)
Hemoglobin: 14.2 g/dL (ref 12.0–16.0)
MCH: 29.7 pg (ref 26.0–34.0)
MCHC: 34 g/dL (ref 32.0–36.0)
MCV: 87.1 fL (ref 80.0–100.0)
PLATELETS: 260 10*3/uL (ref 150–440)
RBC: 4.78 MIL/uL (ref 3.80–5.20)
RDW: 13.9 % (ref 11.5–14.5)
WBC: 10.9 10*3/uL (ref 3.6–11.0)

## 2015-09-30 LAB — LIPASE, BLOOD: LIPASE: 27 U/L (ref 11–51)

## 2015-09-30 MED ORDER — POTASSIUM CHLORIDE IN NACL 20-0.9 MEQ/L-% IV SOLN: INTRAVENOUS | Status: DC

## 2015-09-30 MED ORDER — SODIUM CHLORIDE 0.9 % IV SOLN
8.0000 mg | Freq: Once | INTRAVENOUS | Status: AC
  Administered 2015-09-30: 8 mg via INTRAVENOUS
  Filled 2015-09-30: qty 4

## 2015-09-30 MED ORDER — PROMETHAZINE HCL 25 MG/ML IJ SOLN
25.0000 mg | Freq: Once | INTRAMUSCULAR | Status: AC
  Administered 2015-09-30: 25 mg via INTRAVENOUS

## 2015-09-30 MED ORDER — ONDANSETRON HCL 4 MG PO TABS: 4.0000 mg | ORAL_TABLET | Freq: Three times a day (TID) | ORAL | Status: DC | PRN

## 2015-09-30 MED ORDER — PROMETHAZINE HCL 25 MG/ML IJ SOLN
INTRAMUSCULAR | Status: AC
  Administered 2015-09-30: 25 mg via INTRAVENOUS
  Filled 2015-09-30: qty 1

## 2015-09-30 MED ORDER — PROMETHAZINE HCL 12.5 MG PO TABS: 25.0000 mg | ORAL_TABLET | Freq: Four times a day (QID) | ORAL | Status: DC | PRN

## 2015-09-30 MED ORDER — SODIUM CHLORIDE 0.9 % IV SOLN
1000.0000 mL | Freq: Once | INTRAVENOUS | Status: AC
  Administered 2015-09-30: 1000 mL via INTRAVENOUS

## 2015-09-30 MED ORDER — METOCLOPRAMIDE HCL 5 MG/ML IJ SOLN
10.0000 mg | Freq: Once | INTRAMUSCULAR | Status: AC
Start: 2015-09-30 — End: 2015-09-30
  Administered 2015-09-30: 10 mg via INTRAVENOUS
  Filled 2015-09-30: qty 2

## 2015-09-30 NOTE — ED Provider Notes (Signed)
[facility] Emergency Department Provider Note  ____________________________________________  Time seen: On arrival  I have reviewed the triage vital signs and the nursing notes.   HISTORY  Chief Complaint Insect Bite and Nausea    HPI Margaret Wood is a 54 y.o. female who presents with complaints of nausea and vomiting and body aches. She reports that she has been having flulike symptoms for the past couple of days and today she developed nausea which has been somewhat severe. She is denying unable to tolerate by mouth's. She denies fevers or chills. She reports she was treated for our [facility] spotted fever 2 months ago with doxycycline     Past Medical History  Diagnosis Date  . Palpitations   . Fibromyalgia   . Scoliosis   . Arthritis   . Depression     Patient Active Problem List   Diagnosis Date Noted  . Postmenopausal HRT (hormone replacement therapy) 05/03/2015  . MDD (major depressive disorder), recurrent severe, without psychosis (HCC) 07/24/2013  . GAD (generalized anxiety disorder) 07/24/2013  . Suicidal ideation 07/24/2013  . External hemorrhoid, bleeding 01/22/2013  . Symptoms, such as flushing, sleeplessness, headache, lack of concentration, associated with the menopause 05/29/2012  . Fibromyalgia   . Scoliosis   . SCOLIOSIS, LUMBAR SPINE 10/07/2010  . UNSPECIFIED ARTHROPATHY MULTIPLE SITES 04/27/2008    Past Surgical History  Procedure Laterality Date  . Appendectomy  1980  . Cesarean section  1995  . Cholecystectomy  1997    Current Outpatient Rx  Name  Route  Sig  Dispense  Refill  . ALPRAZolam (XANAX) 0.25 MG tablet      TAKE 1 TABLET BY MOUTH TWICE DAILY   60 tablet   0     Not to exceed 5 additional fills before 01/15/2016   . cyclobenzaprine (FLEXERIL) 10 MG tablet      TAKE 1 TABLET BY MOUTH AT BEDTIME   30 tablet   2   . dicyclomine (BENTYL) 10 MG capsule      TAKE 1 CAPSULE BY MOUTH EVERY 6  HOURS AS NEEDED (CRAMPING).   90 capsule   0   . dicyclomine (BENTYL) 10 MG capsule      TAKE 1 CAPSULE BY MOUTH EVERY 6 HOURS AS NEEDED (CRAMPING).   90 capsule   0   . doxycycline (ADOXA) 100 MG tablet   Oral   Take 1 tablet (100 mg total) by mouth 2 (two) times daily.   14 tablet   0   . estradiol-norethindrone (COMBIPATCH) 0.05-0.14 MG/DAY   Transdermal   Place 1 patch onto the skin 2 (two) times a week.   24 patch   1   . metoCLOPramide (REGLAN) 5 MG tablet   Oral   Take 1 tablet (5 mg total) by mouth 3 (three) times daily.   15 tablet   0   . metoprolol tartrate (LOPRESSOR) 25 MG tablet      TAKE 1 TABLET BY MOUTH TWICE A DAY   180 tablet   0   . ondansetron (ZOFRAN ODT) 4 MG disintegrating tablet   Oral   Take 1 tablet (4 mg total) by mouth every 8 (eight) hours as needed for nausea or vomiting.   10 tablet   0   . pantoprazole (PROTONIX) 40 MG tablet   Oral   Take 1 tablet (40 mg total) by mouth as needed. For GERD.   90 tablet   0   . promethazine (  PHENERGAN) 12.5 MG tablet   Oral   Take 1 tablet (12.5 mg total) by mouth every 8 (eight) hours as needed for nausea or vomiting.   20 tablet   0   . promethazine (PHENERGAN) 25 MG suppository   Rectal   Place 1 suppository (25 mg total) rectally every 6 (six) hours as needed for nausea or vomiting.   12 each   0   . traMADol (ULTRAM) 50 MG tablet      TAKE 1 TABLET BY MOUTH EVERY 8 HOURS   60 tablet   0     Not to exceed 5 additional fills before 01/15/2016     Allergies Amoxicillin; Cymbalta; Nsaids; and Tegretol  History reviewed. No pertinent family history.  Social History Social History  Substance Use Topics  . Smoking status: Never Smoker   . Smokeless tobacco: Never Used  . Alcohol Use: No    Review of Systems  Constitutional: Negative for fever. Eyes: Negative for visual changes. ENT: Negative for sore throat Cardiovascular: Negative for chest pain. Respiratory:  Negative for shortness of breath. Gastrointestinal: Positive for nausea vomiting Genitourinary: Negative for dysuria. Musculoskeletal: Negative for back pain. Positive for body aches Skin: Negative for rash. Neurological: Negative for headaches or focal weakness Psychiatric: Positive for anxiety    ____________________________________________   PHYSICAL EXAM:  VITAL SIGNS: ED Triage Vitals  Enc Vitals Group     BP 09/30/15 1310 150/88 mmHg     Pulse Rate 09/30/15 1310 109     Resp 09/30/15 1314 22     Temp 09/30/15 1314 97.9 F (36.6 C)     Temp Source 09/30/15 1314 Oral     SpO2 09/30/15 1310 99 %     Weight 09/30/15 1314 173 lb (78.472 kg)     Height 09/30/15 1314 5\' 4"  (1.626 m)     Head Cir --      Peak Flow --      Pain Score --      Pain Loc --      Pain Edu? --      Excl. in GC? --      Constitutional: Alert and oriented. Well appearing and in no distress. Eyes: Conjunctivae are normal.  ENT   Head: Normocephalic and atraumatic.   Mouth/Throat: Mucous membranes are moist. Cardiovascular: Normal rate, regular rhythm. Normal and symmetric distal pulses are present in all extremities. No murmurs, rubs, or gallops. Respiratory: Normal respiratory effort without tachypnea nor retractions. Breath sounds are clear and equal bilaterally.  Gastrointestinal: Soft and non-tender in all quadrants. No distention. There is no CVA tenderness. Genitourinary: deferred Musculoskeletal: Nontender with normal range of motion in all extremities. No lower extremity tenderness nor edema. Neurologic:  Normal speech and language. No gross focal neurologic deficits are appreciated. Skin:  Skin is warm, dry and intact. No rash noted. Psychiatric: Mood and affect are normal. Patient exhibits appropriate insight and judgment.  ____________________________________________    LABS (pertinent positives/negatives)  Labs Reviewed  COMPREHENSIVE METABOLIC PANEL - Abnormal; Notable  for the following:    Glucose, Bld 110 (*)    Total Protein 8.2 (*)    All other components within normal limits  CBC  LIPASE, BLOOD    ____________________________________________   EKG  None  ____________________________________________    RADIOLOGY I have personally reviewed any xrays that were ordered on this patient: None  ____________________________________________   PROCEDURES  Procedure(s) performed: none  Critical Care performed: none  ____________________________________________   INITIAL IMPRESSION /  ASSESSMENT AND PLAN / ED COURSE  Pertinent labs & imaging results that were available during my care of the patient were reviewed by me and considered in my medical decision making (see chart for details).  Patient with nausea and vomiting but no abdominal tenderness to palpation. Recent viral illness. Suspect viral etiology, possible gastritis or gastritis. We'll treat with IV fluids and Zofran and reevaluate  ____________________________________________   FINAL CLINICAL IMPRESSION(S) / ED DIAGNOSES  Gastritis  Jene Every, MD 09/30/15 [phone]

## 2015-09-30 NOTE — ED Provider Notes (Addendum)
[facility]  I accepted care from Dr. Cyril LoosenKinner ____________________________________________     INITIAL IMPRESSION / ASSESSMENT AND PLAN / ED COURSE  CONSULTATIONS:  hospitalist for admission  Pertinent labs & imaging results that were available during my care of the patient were reviewed by me and considered in my medical decision making (see chart for details).  Patient was seen here for nausea vomiting and minimal diarrhea, with reassuring laboratory evaluation, however she has persistent/intractable nausea and vomiting. She received Zofran and Phenergan prior to my evaluation the patient. I discussed with them trying a by mouth challenge, and as she sat up she became extremely nauseated again. She was given Reglan. She'll be admitted for observation and treatment of her intractable nausea. No abdominal pain. With Dr. Evaluation reassuring, I'm suspicious that this is a viral syndrome.   Patient was seen by the hospitalist, and patient stated that she is actually feeling better and wanted to try to home. I did discharge her with a prescription for Zofran and Phenergan.  Patient / Family / Caregiver informed of clinical course, medical decision-making process, and agree with plan.   ____________________________________________   FINAL CLINICAL IMPRESSION(S) / ED DIAGNOSES  Final diagnoses:  Intractable vomiting with nausea, unspecified vomiting type      Governor Rooksebecca Walaa Carel, MD 09/30/15 1926  Governor Rooksebecca Jia Mohamed, MD 09/30/15 [phone]

## 2015-09-30 NOTE — ED Notes (Signed)
Patient reports having a tick bite in the beginning of September with symptoms of nausea,vomiting, and body aches. Patient was put on doxycycline for 7 days. 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 Patient reports being sick since Monday with a cold, weakness and nausea starting today

## 2015-09-30 NOTE — Discharge Instructions (Signed)
You were evaluated for nausea and vomiting, and your exam and evaluation are reassuring. Return to the emergency department immediately for any new or worsening condition including concern for dehydration, fever, black or bloody stools, vomiting blood, or any other symptoms concerning to you.  You do need to follow up with your primary care physician next week.   Nausea and Vomiting Nausea is a sick feeling that often comes before throwing up (vomiting). Vomiting is a reflex where stomach contents come out of your mouth. Vomiting can cause severe loss of body fluids (dehydration). Children and elderly adults can become dehydrated quickly, especially if they also have diarrhea. Nausea and vomiting are symptoms of a condition or disease. It is important to find the cause of your symptoms. CAUSES   Direct irritation of the stomach lining. This irritation can result from increased acid production (gastroesophageal reflux disease), infection, food poisoning, taking certain medicines (such as nonsteroidal anti-inflammatory drugs), alcohol use, or tobacco use.  Signals from the brain.These signals could be caused by a headache, heat exposure, an inner ear disturbance, increased pressure in the brain from injury, infection, a tumor, or a concussion, pain, emotional stimulus, or metabolic problems.  An obstruction in the gastrointestinal tract (bowel obstruction).  Illnesses such as diabetes, hepatitis, gallbladder problems, appendicitis, kidney problems, cancer, sepsis, atypical symptoms of a heart attack, or eating disorders.  Medical treatments such as chemotherapy and radiation.  Receiving medicine that makes you sleep (general anesthetic) during surgery. DIAGNOSIS Your caregiver may ask for tests to be done if the problems do not improve after a few days. Tests may also be done if symptoms are severe or if the reason for the nausea and vomiting is not clear. Tests may include:  Urine  tests.  Blood tests.  Stool tests.  Cultures (to look for evidence of infection).  X-rays or other imaging studies. Test results can help your caregiver make decisions about treatment or the need for additional tests. TREATMENT You need to stay well hydrated. Drink frequently but in small amounts.You may wish to drink water, sports drinks, clear broth, or eat frozen ice pops or gelatin dessert to help stay hydrated.When you eat, eating slowly may help prevent nausea.There are also some antinausea medicines that may help prevent nausea. HOME CARE INSTRUCTIONS   Take all medicine as directed by your caregiver.  If you do not have an appetite, do not force yourself to eat. However, you must continue to drink fluids.  If you have an appetite, eat a normal diet unless your caregiver tells you differently.  Eat a variety of complex carbohydrates (rice, wheat, potatoes, bread), lean meats, yogurt, fruits, and vegetables.  Avoid high-fat foods because they are more difficult to digest.  Drink enough water and fluids to keep your urine clear or pale yellow.  If you are dehydrated, ask your caregiver for specific rehydration instructions. Signs of dehydration may include:  Severe thirst.  Dry lips and mouth.  Dizziness.  Dark urine.  Decreasing urine frequency and amount.  Confusion.  Rapid breathing or pulse. SEEK IMMEDIATE MEDICAL CARE IF:   You have blood or brown flecks (like coffee grounds) in your vomit.  You have black or bloody stools.  You have a severe headache or stiff neck.  You are confused.  You have severe abdominal pain.  You have chest pain or trouble breathing.  You do not urinate at least once every 8 hours.  You develop cold or clammy skin.  You continue to vomit  for longer than 24 to 48 hours.  You have a fever. MAKE SURE YOU:   Understand these instructions.  Will watch your condition.  Will get help right away if you are not doing  well or get worse.   This information is not intended to replace advice given to you by your health care provider. Make sure you discuss any questions you have with your health care provider.   Document Released: 11/13/2005 Document Revised: 02/05/2012 Document Reviewed: 04/12/2011 Elsevier Interactive Patient Education [employer].

## 2015-10-06 ENCOUNTER — Other Ambulatory Visit: Payer: Self-pay | Admitting: Family Medicine

## 2015-10-06 NOTE — Telephone Encounter (Signed)
Lm on pts vm informing her OV required for additional refills. Pt advised to contact office to schedule

## 2015-10-07 NOTE — Telephone Encounter (Signed)
Pt contacted office and scheduled OV 11/14

## 2015-10-07 NOTE — Telephone Encounter (Signed)
Rx called in to requested pharmacy 

## 2015-10-07 NOTE — Telephone Encounter (Signed)
Ok to refill one time only. 

## 2015-10-07 NOTE — Telephone Encounter (Signed)
Multiple messages left on pts vm requesting a call back. Pt has not had f/u OV addressing issues for medication since 05/2014

## 2015-10-11 ENCOUNTER — Encounter: Payer: Self-pay | Admitting: Family Medicine

## 2015-10-11 ENCOUNTER — Ambulatory Visit (INDEPENDENT_AMBULATORY_CARE_PROVIDER_SITE_OTHER): Payer: BLUE CROSS/BLUE SHIELD | Admitting: Family Medicine

## 2015-10-11 ENCOUNTER — Encounter: Payer: Self-pay | Admitting: Physician Assistant

## 2015-10-11 VITALS — BP 128/60 | HR 88 | Temp 97.5°F | Wt 182.8 lb

## 2015-10-11 DIAGNOSIS — R112 Nausea with vomiting, unspecified: Secondary | ICD-10-CM | POA: Insufficient documentation

## 2015-10-11 DIAGNOSIS — F411 Generalized anxiety disorder: Secondary | ICD-10-CM

## 2015-10-11 LAB — H. PYLORI ANTIBODY, IGG: H Pylori IgG: NEGATIVE

## 2015-10-11 NOTE — Assessment & Plan Note (Signed)
Persistent symptoms. Refer to GI. Check H pylori, acute hepatitis panel today. Endoscopy warranted at this point. The patient indicates understanding of these issues and agrees with the plan.  Orders Placed This Encounter  Procedures  . H. pylori antibody, IgG  . Hepatitis panel, acute  . Ambulatory referral to Gastroenterology

## 2015-10-11 NOTE — Assessment & Plan Note (Signed)
Stable. Continue to refuse psych at this point. No changes made to rxs.

## 2015-10-11 NOTE — Patient Instructions (Signed)
Great to see you.  I will call you with your lab results.  Someone will call you with your GI appointment.

## 2015-10-11 NOTE — Progress Notes (Signed)
Subjective:   Patient ID: Margaret Wood, female    DOB: [birth year], 54 y.o.   MRN: [id number]  Margaret Wood is a pleasant 54 y.o. year old female who presents to clinic today with Follow-up and Nausea  on 10/11/2015  HPI:  GAD-  Feels symptoms have been well controlled. Still taking xanax 0.25 mg twice daily.  Has not been followed by psych since her last admission.  Persistent nausea and vomiting- ongoing for at least 2 months.  Feels symptoms started when she was diagnosed with RMSF in 07/2015. Has been to the ER multiple times for this complaint ,most recently on 11/3- notes reviewed. No diarrhea or blood in her stool- does have h/o IBS. Taking Reglan, zofran and phenergan at different times.  They do help but symptoms return.  Does have more symptoms of reflux as well. Taking Protonix 40 mg daily.  Recent Results (from the past 2160 hour(s))  CBC with Differential     Status: None   Collection Time: 08/09/15  9:57 AM  Result Value Ref Range   WBC 5.8 3.6 - 11.0 K/uL   RBC 4.74 3.80 - 5.20 MIL/uL   Hemoglobin 13.8 12.0 - 16.0 g/dL   HCT 41.8 35.0 - 47.0 %   MCV 88.1 80.0 - 100.0 fL   MCH 29.1 26.0 - 34.0 pg   MCHC 33.1 32.0 - 36.0 g/dL   RDW 13.1 11.5 - 14.5 %   Platelets 243 150 - 440 K/uL   Neutrophils Relative % 57 %   Neutro Abs 3.2 1.4 - 6.5 K/uL   Lymphocytes Relative 30 %   Lymphs Abs 1.7 1.0 - 3.6 K/uL   Monocytes Relative 8 %   Monocytes Absolute 0.5 0.2 - 0.9 K/uL   Eosinophils Relative 4 %   Eosinophils Absolute 0.2 0 - 0.7 K/uL   Basophils Relative 1 %   Basophils Absolute 0.1 0 - 0.1 K/uL  Comprehensive metabolic panel     Status: None   Collection Time: 08/09/15  9:57 AM  Result Value Ref Range   Sodium 138 135 - 145 mmol/L   Potassium 3.7 3.5 - 5.1 mmol/L   Chloride 105 101 - 111 mmol/L   CO2 24 22 - 32 mmol/L   Glucose, Bld 98 65 - 99 mg/dL   BUN 10 6 - 20 mg/dL   Creatinine, Ser 0.96 0.44 - 1.00 mg/dL   Calcium 9.3 8.9 - 10.3 mg/dL   Total Protein 8.0 6.5 - 8.1 g/dL   Albumin 4.6 3.5 - 5.0 g/dL   AST 28 15 - 41 U/L   ALT 14 14 - 54 U/L   Alkaline Phosphatase 61 38 - 126 U/L   Total Bilirubin 0.6 0.3 - 1.2 mg/dL   GFR calc non Af Amer >60 >60 mL/min   GFR calc Af Amer >60 >60 mL/min    Comment: (NOTE) The eGFR has been calculated using the CKD EPI equation. This calculation has not been validated in all clinical situations. eGFR's persistently <60 mL/min signify possible Chronic Kidney Disease.    Anion gap 9 5 - 15  Urinalysis complete, with microscopic ([facility] only)     Status: Abnormal   Collection Time: 08/09/15 11:56 AM  Result Value Ref Range   Color, Urine STRAW (A) YELLOW   APPearance CLEAR (A) CLEAR   Glucose, UA NEGATIVE NEGATIVE mg/dL   Bilirubin Urine NEGATIVE NEGATIVE   Ketones, ur NEGATIVE NEGATIVE mg/dL   Specific Gravity, Urine 1.003 (L) 1.005 -  1.030   Hgb urine dipstick NEGATIVE NEGATIVE   pH 7.0 5.0 - 8.0   Protein, ur NEGATIVE NEGATIVE mg/dL   Nitrite NEGATIVE NEGATIVE   Leukocytes, UA NEGATIVE NEGATIVE   RBC / HPF NONE SEEN 0 - 5 RBC/hpf   WBC, UA 0-5 0 - 5 WBC/hpf   Bacteria, UA NONE SEEN NONE SEEN   Squamous Epithelial / LPF 0-5 (A) NONE SEEN   Mucous PRESENT   CBC with Differential/Platelet     Status: None   Collection Time: 08/10/15 12:27 PM  Result Value Ref Range   WBC 6.9 3.6 - 11.0 K/uL   RBC 4.91 3.80 - 5.20 MIL/uL   Hemoglobin 14.4 12.0 - 16.0 g/dL   HCT 43.2 35.0 - 47.0 %   MCV 88.1 80.0 - 100.0 fL   MCH 29.3 26.0 - 34.0 pg   MCHC 33.3 32.0 - 36.0 g/dL   RDW 13.4 11.5 - 14.5 %   Platelets 255 150 - 440 K/uL   Neutrophils Relative % 76 %   Neutro Abs 5.2 1.4 - 6.5 K/uL   Lymphocytes Relative 19 %   Lymphs Abs 1.3 1.0 - 3.6 K/uL   Monocytes Relative 5 %   Monocytes Absolute 0.3 0.2 - 0.9 K/uL   Eosinophils Relative 1 %   Eosinophils Absolute 0.0 0 - 0.7 K/uL   Basophils Relative 1 %   Basophils Absolute 0.0 0 - 0.1 K/uL  Comprehensive metabolic panel     Status:  Abnormal   Collection Time: 08/10/15 12:27 PM  Result Value Ref Range   Sodium 140 135 - 145 mmol/L   Potassium 3.8 3.5 - 5.1 mmol/L   Chloride 105 101 - 111 mmol/L   CO2 25 22 - 32 mmol/L   Glucose, Bld 98 65 - 99 mg/dL   BUN 7 6 - 20 mg/dL   Creatinine, Ser 1.02 (H) 0.44 - 1.00 mg/dL   Calcium 9.8 8.9 - 10.3 mg/dL   Total Protein 8.3 (H) 6.5 - 8.1 g/dL   Albumin 4.9 3.5 - 5.0 g/dL   AST 28 15 - 41 U/L   ALT 16 14 - 54 U/L   Alkaline Phosphatase 63 38 - 126 U/L   Total Bilirubin 1.7 (H) 0.3 - 1.2 mg/dL   GFR calc non Af Amer >60 >60 mL/min   GFR calc Af Amer >60 >60 mL/min    Comment: (NOTE) The eGFR has been calculated using the CKD EPI equation. This calculation has not been validated in all clinical situations. eGFR's persistently <60 mL/min signify possible Chronic Kidney Disease.    Anion gap 10 5 - 15  Lipase, blood     Status: None   Collection Time: 08/10/15 12:27 PM  Result Value Ref Range   Lipase 25 22 - 51 U/L  Urinalysis complete, with microscopic ([facility] only)     Status: Abnormal   Collection Time: 08/10/15 12:27 PM  Result Value Ref Range   Color, Urine YELLOW (A) YELLOW   APPearance CLEAR (A) CLEAR   Glucose, UA NEGATIVE NEGATIVE mg/dL   Bilirubin Urine NEGATIVE NEGATIVE   Ketones, ur 1+ (A) NEGATIVE mg/dL   Specific Gravity, Urine 1.006 1.005 - 1.030   Hgb urine dipstick NEGATIVE NEGATIVE   pH 6.0 5.0 - 8.0   Protein, ur NEGATIVE NEGATIVE mg/dL   Nitrite NEGATIVE NEGATIVE   Leukocytes, UA NEGATIVE NEGATIVE   RBC / HPF NONE SEEN 0 - 5 RBC/hpf   WBC, UA 0-5 0 - 5 WBC/hpf  Bacteria, UA NONE SEEN NONE SEEN   Squamous Epithelial / LPF 0-5 (A) NONE SEEN  Rocky mtn spotted fvr abs pnl(IgG+IgM)     Status: Abnormal   Collection Time: 08/10/15  3:24 PM  Result Value Ref Range   RMSF IgG Positive (A) Negative   RMSF IgM 1.18 (H) 0.00 - 0.89 index    Comment: (NOTE)                                 Negative        <0.90                                  Equivocal 0.90 - 1.10                                 Positive        >1.10 Performed At: [facility] [place], [place] [id number] Lindon Romp MD WU:[id number]   B. Claris Che antibodies     Status: None   Collection Time: 08/10/15  3:24 PM  Result Value Ref Range   B burgdorferi Ab IgG+IgM <0.91 0.00 - 0.90 ISR    Comment: (NOTE)                                Negative         <0.91                                Equivocal  0.91 - 1.09                                Positive         >1.09 Performed At: [facility] [address] [place], [place] [id number] Lindon Romp MD VH:[id number]   RMSF, IgG, IFA     Status: None   Collection Time: 08/10/15  3:24 PM  Result Value Ref Range   RMSF, IGG, IFA <1:64 Neg <1:64    Comment: (NOTE)                             Negative           <1:64                             Positive            1:64                             Recent/Active      >1:64 Titers of 1:64 are suggestive of past or possible current infection. Titers >1:64 are suggestive of recent or active infection. Approximately 9% of specimens positive by EIA screen are negative by IFA. Performed At: [facility] [place], [place] [id number] Lindon Romp MD [id number]   CBC     Status: None   Collection Time: 09/30/15  1:20 PM  Result Value Ref Range   WBC 10.9  3.6 - 11.0 K/uL   RBC 4.78 3.80 - 5.20 MIL/uL   Hemoglobin 14.2 12.0 - 16.0 g/dL   HCT 41.7 35.0 - 47.0 %   MCV 87.1 80.0 - 100.0 fL   MCH 29.7 26.0 - 34.0 pg   MCHC 34.0 32.0 - 36.0 g/dL   RDW 13.9 11.5 - 14.5 %   Platelets 260 150 - 440 K/uL  Comprehensive metabolic panel     Status: Abnormal   Collection Time: 09/30/15  1:20 PM  Result Value Ref Range   Sodium 136 135 - 145 mmol/L   Potassium 3.5 3.5 - 5.1 mmol/L   Chloride 105 101 - 111 mmol/L   CO2 22 22 - 32 mmol/L   Glucose, Bld 110 (H) 65 - 99 mg/dL   BUN 11 6 - 20 mg/dL    Creatinine, Ser 0.87 0.44 - 1.00 mg/dL   Calcium 9.3 8.9 - 10.3 mg/dL   Total Protein 8.2 (H) 6.5 - 8.1 g/dL   Albumin 4.4 3.5 - 5.0 g/dL   AST 28 15 - 41 U/L   ALT 21 14 - 54 U/L   Alkaline Phosphatase 109 38 - 126 U/L   Total Bilirubin 0.9 0.3 - 1.2 mg/dL   GFR calc non Af Amer >60 >60 mL/min   GFR calc Af Amer >60 >60 mL/min    Comment: (NOTE) The eGFR has been calculated using the CKD EPI equation. This calculation has not been validated in all clinical situations. eGFR's persistently <60 mL/min signify possible Chronic Kidney Disease.    Anion gap 9 5 - 15  Lipase, blood     Status: None   Collection Time: 09/30/15  1:20 PM  Result Value Ref Range   Lipase 27 11 - 51 U/L    Comment: Please note change in reference range.    Current Outpatient Prescriptions on File Prior to Visit  Medication Sig Dispense Refill  . ALPRAZolam (XANAX) 0.25 MG tablet TAKE 1 TABLET BY MOUTH TWICE A DAY 60 tablet 0  . cyclobenzaprine (FLEXERIL) 10 MG tablet TAKE 1 TABLET BY MOUTH AT BEDTIME (Patient taking differently: TAKE 1 TABLET BY MOUTH AT BEDTIME PRN FOR SPASMS) 30 tablet 2  . dicyclomine (BENTYL) 10 MG capsule TAKE 1 CAPSULE BY MOUTH EVERY 6 HOURS AS NEEDED (CRAMPING). (Patient taking differently: TAKE 1 CAPSULE BY MOUTH EVERY 6 HOURS AS NEEDED FOR CRAMPING) 90 capsule 0  . doxycycline (ADOXA) 100 MG tablet Take 1 tablet (100 mg total) by mouth 2 (two) times daily. 14 tablet 0  . estradiol-norethindrone (COMBIPATCH) 0.05-0.14 MG/DAY Place 1 patch onto the skin 2 (two) times a week. (Patient taking differently: Place 1 patch onto the skin 2 (two) times a week. On Sunday and Wednesday) 24 patch 1  . metoCLOPramide (REGLAN) 5 MG tablet Take 1 tablet (5 mg total) by mouth 3 (three) times daily. 15 tablet 0  . metoprolol tartrate (LOPRESSOR) 25 MG tablet TAKE 1 TABLET BY MOUTH TWICE A DAY (Patient taking differently: TAKE 1 TABLET BY MOUTH TWICE A DAY PRN FOR PALPITATIONS) 180 tablet 0  .  ondansetron (ZOFRAN ODT) 4 MG disintegrating tablet Take 1 tablet (4 mg total) by mouth every 8 (eight) hours as needed for nausea or vomiting. 10 tablet 0  . pantoprazole (PROTONIX) 40 MG tablet Take 1 tablet (40 mg total) by mouth as needed. For GERD. (Patient taking differently: Take 40 mg by mouth daily as needed (for acid reflux). ) 90 tablet 0  . promethazine (PHENERGAN)  12.5 MG tablet Take 2 tablets (25 mg total) by mouth every 6 (six) hours as needed for nausea or vomiting. 20 tablet 0  . traMADol (ULTRAM) 50 MG tablet TAKE 1 TABLET BY MOUTH EVERY 8 HOURS 60 tablet 0   No current facility-administered medications on file prior to visit.    Allergies  Allergen Reactions  . Amoxicillin Nausea Only  . Cymbalta [Duloxetine Hcl]     SOB, palpitations, nausea  . Nsaids     REACTION: abd pain  . Tegretol [Carbamazepine]     Made her ill    Past Medical History  Diagnosis Date  . Palpitations   . Fibromyalgia   . Scoliosis   . Arthritis   . Depression     Past Surgical History  Procedure Laterality Date  . Appendectomy  1980  . Cesarean section  1995  . Cholecystectomy  1997    History reviewed. No pertinent family history.  Social History   Social History  . Marital Status: Married    Spouse Name: N/A  . Number of Children: 2  . Years of Education: N/A   Occupational History  . UNEMPLOYED    Social History Main Topics  . Smoking status: Never Smoker   . Smokeless tobacco: Never Used  . Alcohol Use: No  . Drug Use: No  . Sexual Activity: Yes   Other Topics Concern  . Not on file   Social History Narrative   Mother is Doylene Canning.   The PMH, PSH, Social History, Family History, Medications, and allergies have been reviewed in [facility], and have been updated if relevant.    Review of Systems  Constitutional: Positive for fatigue. Negative for fever.  Respiratory: Negative.   Cardiovascular: Negative.   Gastrointestinal: Positive for nausea, vomiting and  abdominal pain. Negative for diarrhea, constipation, blood in stool, anal bleeding and rectal pain.  Musculoskeletal: Negative.   Skin: Negative.   Hematological: Negative.   Psychiatric/Behavioral: Negative.   All other systems reviewed and are negative.      Objective:    BP 128/60 mmHg  Pulse 88  Temp(Src) 97.5 F (36.4 C) (Oral)  Wt 182 lb 12 oz (82.895 kg)  SpO2 98% Wt Readings from Last 3 Encounters:  10/11/15 182 lb 12 oz (82.895 kg)  09/30/15 173 lb (78.472 kg)  08/10/15 184 lb (83.462 kg)     Physical Exam  Constitutional: She is oriented to person, place, and time. She appears well-developed and well-nourished. No distress.  HENT:  Head: Normocephalic.  Eyes: Conjunctivae are normal.  Cardiovascular: Normal rate.   Pulmonary/Chest: Effort normal.  Abdominal: Soft. She exhibits no distension and no mass. There is tenderness. There is no rebound and no guarding.  Musculoskeletal: Normal range of motion.  Neurological: She is alert and oriented to person, place, and time. No cranial nerve deficit.  Skin: Skin is warm and dry.  Psychiatric: She has a normal mood and affect. Her behavior is normal. Thought content normal.  Nursing note and vitals reviewed.         Assessment & Plan:   GAD (generalized anxiety disorder)  Nausea and vomiting, intractability of vomiting not specified, unspecified vomiting type - Plan: H. pylori antibody, IgG, Ambulatory referral to Gastroenterology, Hepatitis panel, acute No Follow-up on file.

## 2015-10-11 NOTE — Progress Notes (Signed)
Pre visit review using our clinic review tool, if applicable. No additional management support is needed unless otherwise documented below in the visit note. 

## 2015-10-12 LAB — HEPATITIS PANEL, ACUTE
HCV AB: NEGATIVE
HEP B C IGM: NONREACTIVE
HEP B S AG: NEGATIVE
Hep A IgM: NONREACTIVE

## 2015-10-14 ENCOUNTER — Ambulatory Visit: Payer: Self-pay | Admitting: Physician Assistant

## 2015-11-09 ENCOUNTER — Other Ambulatory Visit: Payer: Self-pay | Admitting: Family Medicine

## 2015-11-10 ENCOUNTER — Encounter: Payer: Self-pay | Admitting: Family Medicine

## 2015-11-10 NOTE — Telephone Encounter (Signed)
The only thing i can suggest is to have her go to an alternate pharmacy or try a new medication. Distributors may be different depending on the pharmacy

## 2015-11-10 NOTE — Telephone Encounter (Signed)
Last f/u 09/2015 

## 2015-11-11 ENCOUNTER — Encounter: Payer: Self-pay | Admitting: Family Medicine

## 2015-11-11 ENCOUNTER — Telehealth: Payer: Self-pay | Admitting: Family Medicine

## 2015-11-11 ENCOUNTER — Other Ambulatory Visit: Payer: Self-pay | Admitting: Family Medicine

## 2015-11-11 MED ORDER — PROGESTERONE MICRONIZED 100 MG PO CAPS: 100.0000 mg | ORAL_CAPSULE | Freq: Every day | ORAL | Status: DC

## 2015-11-11 MED ORDER — ESTRADIOL 0.05 MG/24HR TD PTTW: 1.0000 | MEDICATED_PATCH | TRANSDERMAL | Status: DC

## 2015-11-11 NOTE — Telephone Encounter (Signed)
Pt has contacted pharmacy and confirmed covered med. See pt mychart message

## 2015-11-11 NOTE — Telephone Encounter (Signed)
Pt's husband would like to speak with you on the phone regarding a medication (combi patch) that is temporary unavailable, they are wanting to know what will be best to replace this rx. The the pharmacy will not fill for anything else without the rx for the new medication. She has 2 patches left. The best number is [phone].

## 2015-11-11 NOTE — Telephone Encounter (Signed)
Please call the pharmacy to inquire what an appropriate substitution would be that is available.

## 2015-11-11 NOTE — Telephone Encounter (Signed)
Rx called in to requested pharmacy 

## 2015-11-13 ENCOUNTER — Encounter: Payer: Self-pay | Admitting: Family Medicine

## 2015-11-20 ENCOUNTER — Other Ambulatory Visit: Payer: Self-pay | Admitting: Family Medicine

## 2015-12-12 ENCOUNTER — Encounter: Payer: Self-pay | Admitting: Family Medicine

## 2015-12-13 ENCOUNTER — Other Ambulatory Visit: Payer: Self-pay | Admitting: Family Medicine

## 2015-12-13 MED ORDER — HYDROCORTISONE ACETATE 25 MG RE SUPP: 25.0000 mg | Freq: Two times a day (BID) | RECTAL | Status: DC

## 2015-12-16 ENCOUNTER — Telehealth: Payer: Self-pay | Admitting: *Deleted

## 2015-12-16 NOTE — Telephone Encounter (Signed)
Lm on pts vm requesting a call back. Form received indicating PA required for pts suppositories. PA has been denied and pt will either have to pay for Rx or she may obtain suppositories OTC. Pharmacy indicated OTC appx $10

## 2015-12-24 ENCOUNTER — Other Ambulatory Visit: Payer: Self-pay | Admitting: *Deleted

## 2015-12-24 ENCOUNTER — Encounter: Payer: Self-pay | Admitting: Family Medicine

## 2015-12-24 NOTE — Telephone Encounter (Signed)
Lm on pts vm requesting a call back 

## 2015-12-24 NOTE — Telephone Encounter (Signed)
Spoke to pt. She said she just had a follow up in Nov 2016 and discussed meds with Dr. Dayton Martes

## 2015-12-24 NOTE — Telephone Encounter (Signed)
pts last fibromyalgia f/u 05/2014

## 2015-12-24 NOTE — Telephone Encounter (Signed)
Ok to refill.  Please schedule 30 minute follow up.

## 2015-12-27 MED ORDER — TRAMADOL HCL 50 MG PO TABS: 50.0000 mg | ORAL_TABLET | Freq: Three times a day (TID) | ORAL | Status: DC

## 2015-12-27 MED ORDER — ALPRAZOLAM 0.25 MG PO TABS: 0.2500 mg | ORAL_TABLET | Freq: Two times a day (BID) | ORAL | Status: DC

## 2015-12-27 NOTE — Telephone Encounter (Signed)
Rx called in to requested pharmacy per Dr Dayton Martes

## 2016-01-05 ENCOUNTER — Encounter: Payer: Self-pay | Admitting: Family Medicine

## 2016-01-05 ENCOUNTER — Ambulatory Visit (INDEPENDENT_AMBULATORY_CARE_PROVIDER_SITE_OTHER): Payer: BLUE CROSS/BLUE SHIELD | Admitting: Family Medicine

## 2016-01-05 VITALS — BP 116/70 | HR 110 | Temp 98.1°F | Wt 187.5 lb

## 2016-01-05 DIAGNOSIS — M797 Fibromyalgia: Secondary | ICD-10-CM | POA: Diagnosis not present

## 2016-01-05 DIAGNOSIS — Z23 Encounter for immunization: Secondary | ICD-10-CM

## 2016-01-05 NOTE — Assessment & Plan Note (Signed)
>  25 minutes spent in face to face time with patient, >50% spent in counselling or coordination of care Symptoms under relatively good control. Continue current rxs. Did advise her to journal her calories and exercise- should be easier now with her apple watch.  Weight loss will improve her symptoms. The patient indicates understanding of these issues and agrees with the plan.

## 2016-01-05 NOTE — Progress Notes (Signed)
Subjective:   Patient ID: Margaret Wood, female    DOB: 10-15-1961, 55 y.o.   MRN: [id number]  Margaret Wood is a pleasant 55 y.o. year old female who presents to clinic today with her husband for Follow-up  on 01/05/2016  HPI:   Fibromyalgia- symptoms have never been so stable.  Exercise and weight loss have been helpful.  Still needs to take prn Tramadol and cyclobenzaprine.  Does not take either one every day.  Takes Tylenol as well.  Allergic to Lyrica and Cymbalta- causes extreme sedation.  She has started walking again which helps.  Pain is mainly in her shoulders and hips.   Wt Readings from Last 3 Encounters:  01/05/16 187 lb 8 oz (85.049 kg)  10/11/15 182 lb 12 oz (82.895 kg)  09/30/15 173 lb (78.472 kg)     Current Outpatient Prescriptions on File Prior to Visit  Medication Sig Dispense Refill  . ALPRAZolam (XANAX) 0.25 MG tablet Take 1 tablet (0.25 mg total) by mouth 2 (two) times daily. 60 tablet 0  . cyclobenzaprine (FLEXERIL) 10 MG tablet TAKE 1 TABLET BY MOUTH AT BEDTIME (Patient taking differently: TAKE 1 TABLET BY MOUTH AT BEDTIME PRN FOR SPASMS) 30 tablet 2  . estradiol (VIVELLE-DOT) 0.05 MG/24HR patch Place 1 patch (0.05 mg total) onto the skin 2 (two) times a week. 28 patch 12  . hydrocortisone (ANUSOL-HC) 25 MG suppository Place 1 suppository (25 mg total) rectally 2 (two) times daily. 12 suppository 0  . metoprolol tartrate (LOPRESSOR) 25 MG tablet TAKE 1 TABLET BY MOUTH TWICE A DAY 180 tablet 1  . progesterone (PROMETRIUM) 100 MG capsule Take 1 capsule (100 mg total) by mouth daily. 30 capsule 3  . traMADol (ULTRAM) 50 MG tablet Take 1 tablet (50 mg total) by mouth every 8 (eight) hours. 60 tablet 0   No current facility-administered medications on file prior to visit.    Allergies  Allergen Reactions  . Amoxicillin Nausea Only  . Cymbalta [Duloxetine Hcl]     SOB, palpitations, nausea  . Nsaids     REACTION: abd pain  . Tegretol  [Carbamazepine]     Made her ill    Past Medical History  Diagnosis Date  . Palpitations   . Fibromyalgia   . Scoliosis   . Arthritis   . Depression     Past Surgical History  Procedure Laterality Date  . Appendectomy  1980  . Cesarean section  1995  . Cholecystectomy  1997    No family history on file.  Social History   Social History  . Marital Status: Married    Spouse Name: N/A  . Number of Children: 2  . Years of Education: N/A   Occupational History  . UNEMPLOYED    Social History Main Topics  . Smoking status: Never Smoker   . Smokeless tobacco: Never Used  . Alcohol Use: No  . Drug Use: No  . Sexual Activity: Yes   Other Topics Concern  . Not on file   Social History Narrative   Mother is Sherrie Mustache.   The PMH, PSH, Social History, Family History, Medications, and allergies have been reviewed in [facility], and have been updated if relevant.   Review of Systems  Musculoskeletal: Positive for myalgias.  Neurological: Negative.   All other systems reviewed and are negative.       Objective:    BP 116/70 mmHg  Pulse 110  Temp(Src) 98.1 F (36.7 C) (Oral)  Wt  187 lb 8 oz (85.049 kg)  SpO2 97%   Physical Exam  Constitutional: She is oriented to person, place, and time. She appears well-developed and well-nourished. No distress.  HENT:  Head: Normocephalic.  Eyes: Conjunctivae are normal.  Cardiovascular: Normal rate.   Pulmonary/Chest: Effort normal.  Musculoskeletal: Normal range of motion.  Neurological: She is alert and oriented to person, place, and time. No cranial nerve deficit.  Skin: Skin is warm.  Psychiatric: She has a normal mood and affect. Her behavior is normal. Judgment and thought content normal.  Nursing note and vitals reviewed.         Assessment & Plan:   Fibromyalgia  Need for influenza vaccination - Plan: Flu Vaccine QUAD 36+ mos PF IM (Fluarix & Fluzone Quad PF) No Follow-up on file.

## 2016-01-05 NOTE — Progress Notes (Signed)
Pre visit review using our clinic review tool, if applicable. No additional management support is needed unless otherwise documented below in the visit note. 

## 2016-01-31 ENCOUNTER — Other Ambulatory Visit: Payer: Self-pay | Admitting: Family Medicine

## 2016-02-01 NOTE — Telephone Encounter (Signed)
Rx called in to requested pharmacy 

## 2016-02-01 NOTE — Telephone Encounter (Signed)
Last f/u 12/2015 

## 2016-03-01 ENCOUNTER — Other Ambulatory Visit: Payer: Self-pay | Admitting: Family Medicine

## 2016-03-02 NOTE — Telephone Encounter (Signed)
Last f/u 12/2015 

## 2016-03-02 NOTE — Telephone Encounter (Signed)
Rx called in to requested pharmacy 

## 2016-04-03 ENCOUNTER — Other Ambulatory Visit: Payer: Self-pay | Admitting: Family Medicine

## 2016-04-03 NOTE — Telephone Encounter (Signed)
Rx called in to requested pharmacy 

## 2016-04-03 NOTE — Telephone Encounter (Signed)
Last f/u 12/2015 

## 2016-04-07 ENCOUNTER — Other Ambulatory Visit: Payer: Self-pay | Admitting: *Deleted

## 2016-04-07 MED ORDER — PROGESTERONE MICRONIZED 100 MG PO CAPS: ORAL_CAPSULE | ORAL | Status: DC

## 2016-04-07 NOTE — Telephone Encounter (Signed)
Pt changed pharmacies and is requesting a new refill

## 2016-04-14 ENCOUNTER — Emergency Department (HOSPITAL_COMMUNITY): Payer: BLUE CROSS/BLUE SHIELD

## 2016-04-14 ENCOUNTER — Encounter (HOSPITAL_COMMUNITY): Payer: Self-pay | Admitting: Emergency Medicine

## 2016-04-14 ENCOUNTER — Telehealth: Payer: Self-pay | Admitting: Family Medicine

## 2016-04-14 ENCOUNTER — Encounter: Payer: Self-pay | Admitting: Cardiovascular Disease

## 2016-04-14 ENCOUNTER — Emergency Department (HOSPITAL_COMMUNITY)
Admission: EM | Admit: 2016-04-14 | Discharge: 2016-04-14 | Disposition: A | Discharge status: Home / Self Care | Payer: BLUE CROSS/BLUE SHIELD | Attending: Emergency Medicine | Admitting: Emergency Medicine

## 2016-04-14 DIAGNOSIS — Z79899 Other long term (current) drug therapy: Secondary | ICD-10-CM | POA: Insufficient documentation

## 2016-04-14 DIAGNOSIS — R002 Palpitations: Secondary | ICD-10-CM | POA: Insufficient documentation

## 2016-04-14 DIAGNOSIS — Z880 Allergy status to penicillin: Secondary | ICD-10-CM | POA: Diagnosis not present

## 2016-04-14 DIAGNOSIS — Z7952 Long term (current) use of systemic steroids: Secondary | ICD-10-CM | POA: Insufficient documentation

## 2016-04-14 DIAGNOSIS — F329 Major depressive disorder, single episode, unspecified: Secondary | ICD-10-CM | POA: Diagnosis not present

## 2016-04-14 DIAGNOSIS — R200 Anesthesia of skin: Secondary | ICD-10-CM | POA: Diagnosis not present

## 2016-04-14 DIAGNOSIS — M419 Scoliosis, unspecified: Secondary | ICD-10-CM | POA: Insufficient documentation

## 2016-04-14 DIAGNOSIS — R0602 Shortness of breath: Secondary | ICD-10-CM | POA: Insufficient documentation

## 2016-04-14 LAB — BASIC METABOLIC PANEL
Anion gap: 12 (ref 5–15)
BUN: 13 mg/dL (ref 6–20)
CHLORIDE: 104 mmol/L (ref 101–111)
CO2: 26 mmol/L (ref 22–32)
CREATININE: 1.05 mg/dL — AB (ref 0.44–1.00)
Calcium: 9.7 mg/dL (ref 8.9–10.3)
GFR calc Af Amer: >60 mL/min (ref >60)
GFR, EST NON AFRICAN AMERICAN: 60 mL/min — AB (ref >60)
GLUCOSE: 88 mg/dL (ref 65–99)
Potassium: 4.5 mmol/L (ref 3.5–5.1)
SODIUM: 142 mmol/L (ref 135–145)

## 2016-04-14 LAB — CBC
HCT: 42.4 % (ref 36.0–46.0)
Hemoglobin: 14.3 g/dL (ref 12.0–15.0)
MCH: 29.7 pg (ref 26.0–34.0)
MCHC: 33.7 g/dL (ref 30.0–36.0)
MCV: 88.1 fL (ref 78.0–100.0)
PLATELETS: 291 10*3/uL (ref 150–400)
RBC: 4.81 MIL/uL (ref 3.87–5.11)
RDW: 12.4 % (ref 11.5–15.5)
WBC: 7.6 10*3/uL (ref 4.0–10.5)

## 2016-04-14 LAB — I-STAT TROPONIN, ED: Troponin i, poc: 0 ng/mL (ref 0.00–0.08)

## 2016-04-14 NOTE — Telephone Encounter (Signed)
Per chart review tab pt is at Margaret Wood. 

## 2016-04-14 NOTE — Telephone Encounter (Signed)
This encounter was created in error - please disregard.

## 2016-04-14 NOTE — ED Notes (Signed)
PA at the bedside.

## 2016-04-14 NOTE — Telephone Encounter (Signed)
Patient Name: Joseph ArtMICHELLE Codd DOB: 1961/01/31 Initial Comment Caller states, wants to have his wife seen next week, she has cardiologist Sx. The the last few weeks she has been very weak, short of breath, constant fatigue, swelling in feet, frequent urination at night. HR will race if she stands up. Nurse Assessment Nurse: Yetta BarreJones, RN, Miranda Date/Time (Eastern Time): 04/14/2016 2:45:50 PM Confirm and document reason for call. If symptomatic, describe symptoms. You must click the next button to save text entered. ---Caller states his wife is having fatigue and when she stands and walks her HR elevates and she feels SOB. Has swelling in her feet and frequent urination at night. Symptoms for the last 1-2 weeks. Has the patient traveled out of the country within the last 30 days? ---Not Applicable Does the patient have any new or worsening symptoms? ---Yes Will a triage be completed? ---Yes Related visit to physician within the last 2 weeks? ---No Does the PT have any chronic conditions? (i.e. diabetes, asthma, etc.) ---Yes List chronic conditions. ---A-fib, Is the patient pregnant or possibly pregnant? (Ask all females between the ages of 6712-55) ---No Is this a behavioral health or substance abuse call? ---No Guidelines Guideline Title Affirmed Question Affirmed Notes Heart Rate and Heartbeat Questions New or worsened shortness of breath with activity (dyspnea on exertion) Final Disposition User Go to ED Now (or PCP triage) Yetta BarreJones, RN, Miranda Comments Told caller once pt was seen in the ED, to call back to make a follow up appt. [facility] Disagree/Comply: Comply

## 2016-04-14 NOTE — Discharge Instructions (Signed)

## 2016-04-14 NOTE — ED Provider Notes (Signed)
CSN: [id number]     Arrival date & time 04/14/16  1611 History   First MD Initiated Contact with Patient 04/14/16 1833     Chief Complaint  Patient presents with  . Palpitations     (Consider location/radiation/quality/duration/timing/severity/associated sxs/prior Treatment) HPI Comments: Patient presents today with a chief complaint of palpitations.  She states that the palpitations have been intermittent over the past couple of weeks.  She is unclear how long each episode lasts.  She has been taking Metoprolol for the palpitations, which gives her temporary relief.  She reports some SOB associated with the palpitations and also some numbness/tingling of both arms, both legs, and her lips during these episodes.  She denies any chest pain, syncope, SOB, fever, or chills.  Denies any recent alcohol use, caffeine use, or recreational drug use.  She states that she has a history of palpitations and has been seen by Cardiology in the past and has worn Holter Monitors.    Patient is a 55 y.o. female presenting with palpitations. The history is provided by the patient.  Palpitations   Past Medical History  Diagnosis Date  . Palpitations   . Fibromyalgia   . Scoliosis   . Arthritis   . Depression    Past Surgical History  Procedure Laterality Date  . Appendectomy  1980  . Cesarean section  1995  . Cholecystectomy  1997   History reviewed. No pertinent family history. Social History  Substance Use Topics  . Smoking status: Never Smoker   . Smokeless tobacco: Never Used  . Alcohol Use: No   OB History    Gravida Para Term Preterm AB TAB SAB Ectopic Multiple Living   1         2     Review of Systems  Cardiovascular: Positive for palpitations.  All other systems reviewed and are negative.     Allergies  Amoxicillin; Cymbalta; Nsaids; and Tegretol  Home Medications   Prior to Admission medications   Medication Sig Start Date End Date Taking? Authorizing Provider   ALPRAZolam Prudy Feeler(XANAX) 0.25 MG tablet TAKE 1 TABLET BY MOUTH TWICE DAILY 04/03/16   Dianne Dunalia M Aron, MD  cyclobenzaprine (FLEXERIL) 10 MG tablet TAKE 1 TABLET BY MOUTH AT BEDTIME 03/02/16   Dianne Dunalia M Aron, MD  dicyclomine (BENTYL) 10 MG capsule Take 10 mg by mouth every 6 (six) hours as needed.     Historical Provider, MD  estradiol (VIVELLE-DOT) 0.05 MG/24HR patch Place 1 patch (0.05 mg total) onto the skin 2 (two) times a week. 11/11/15   Dianne Dunalia M Aron, MD  hydrocortisone (ANUSOL-HC) 25 MG suppository Place 1 suppository (25 mg total) rectally 2 (two) times daily. 12/13/15   Dianne Dunalia M Aron, MD  metoprolol tartrate (LOPRESSOR) 25 MG tablet TAKE 1 TABLET BY MOUTH TWICE A DAY 11/23/15   Dianne Dunalia M Aron, MD  progesterone (PROMETRIUM) 100 MG capsule TAKE 1 CAPSULE (100 MG TOTAL) BY MOUTH DAILY. 04/07/16   Dianne Dunalia M Aron, MD  traMADol (ULTRAM) 50 MG tablet TAKE 1 TABLET BY MOUTH EVERY 8 HOURS 04/03/16   Dianne Dunalia M Aron, MD   BP 135/88 mmHg  Pulse 89  Temp(Src) 98 F (36.7 C) (Oral)  Resp 22  Ht 5\' 4"  (1.626 m)  Wt 84.687 kg  BMI 32.03 kg/m2  SpO2 99% Physical Exam  Constitutional: She appears well-developed and well-nourished.  HENT:  Head: Normocephalic and atraumatic.  Mouth/Throat: Oropharynx is clear and moist.  Eyes: EOM are normal. Pupils are equal, round, and  reactive to light.  Neck: Normal range of motion. Neck supple.  Cardiovascular: Normal rate, regular rhythm, normal heart sounds and intact distal pulses.   Pulmonary/Chest: Effort normal and breath sounds normal.  Musculoskeletal: Normal range of motion.  No LE edema or erythema   Neurological: She is alert.  Skin: Skin is warm and dry. She is not diaphoretic.  Psychiatric: She has a normal mood and affect.  Nursing note and vitals reviewed.   ED Course  Procedures (including critical care time) Labs Review Labs Reviewed  BASIC METABOLIC PANEL - Abnormal; Notable for the following:    Creatinine, Ser 1.05 (*)    GFR calc non Af Amer 60 (*)     All other components within normal limits  CBC  I-STAT TROPOININ, ED    Imaging Review Dg Chest 2 View  04/14/2016  CLINICAL DATA:  Increased heart rate, shortness of breath, LEFT-sided chest pressure radiating to LEFT side of jaw for 2 weeks, chest and head congestion for 1 week, palpitations, fibromyalgia EXAM: CHEST  2 VIEW COMPARISON:  04/13/2010 FINDINGS: Normal heart size, mediastinal contours, and pulmonary vascularity. Lungs clear. No pleural effusion or pneumothorax. Bones slightly demineralized. IMPRESSION: No acute abnormalities. Electronically Signed   By: Ulyses Southward M.D.   On: 04/14/2016 17:07   I have personally reviewed and evaluated these images and lab results as part of my medical decision-making.   EKG Interpretation   Date/Time:  Friday Apr 14 2016 16:24:47 EDT Ventricular Rate:  84 PR Interval:  138 QRS Duration: 78 QT Interval:  354 QTC Calculation: 418 R Axis:   34 Text Interpretation:  Accelerated Junctional rhythm Low voltage QRS Cannot  rule out Anterior infarct , age undetermined Abnormal ECG Confirmed by  Adriana Simas  MD, BRIAN ([id number]) on 04/14/2016 6:40:49 PM      MDM   Final diagnoses:  None   Patient presents today with palpitations.  She has a history of same and currently takes Metoprolol, which helps.  She denies any CP or SOB.  No ischemic changes on EKG.  Dr. Adriana Simas also reviewed EKG.  Troponin negative.  Labs unremarkable.  CXR is negative.  Feel that the patient is stable for discharge.  Given referral to Cardiology.  Return precautions given.      Santiago Glad, PA-C 04/16/16 1049  Donnetta Hutching, MD 04/16/16 [phone]

## 2016-04-14 NOTE — ED Notes (Signed)
Pt sts SOB and tingling in arms and mouth intermittent and worse today; pt sts hx of same; pt sts some fatigue and SOB

## 2016-04-17 ENCOUNTER — Telehealth: Payer: Self-pay | Admitting: Family Medicine

## 2016-04-17 DIAGNOSIS — R002 Palpitations: Secondary | ICD-10-CM

## 2016-04-17 NOTE — Telephone Encounter (Signed)
Pt was seen in ER.  Patient needs a referral to a cardiologist per the ER doctor.  Please call pt's husband back.

## 2016-04-18 NOTE — Telephone Encounter (Signed)
Spoke to pt and advised referral has been placed. Pt advised to await a call with appt details

## 2016-04-18 NOTE — Telephone Encounter (Signed)
Please call pt's husband.  ER note reviewed, cardiology referral placed.

## 2016-05-08 ENCOUNTER — Telehealth: Payer: Self-pay | Admitting: Cardiology

## 2016-05-08 ENCOUNTER — Ambulatory Visit (INDEPENDENT_AMBULATORY_CARE_PROVIDER_SITE_OTHER): Payer: BLUE CROSS/BLUE SHIELD | Admitting: Cardiology

## 2016-05-08 ENCOUNTER — Encounter: Payer: Self-pay | Admitting: Cardiology

## 2016-05-08 VITALS — BP 141/83 | HR 101 | Ht 64.0 in | Wt 191.0 lb

## 2016-05-08 DIAGNOSIS — R0789 Other chest pain: Secondary | ICD-10-CM

## 2016-05-08 DIAGNOSIS — R55 Syncope and collapse: Secondary | ICD-10-CM

## 2016-05-08 DIAGNOSIS — R002 Palpitations: Secondary | ICD-10-CM

## 2016-05-08 DIAGNOSIS — R0602 Shortness of breath: Secondary | ICD-10-CM

## 2016-05-08 NOTE — Telephone Encounter (Signed)
Left voicemail message for patient to call back to reschedule.

## 2016-05-08 NOTE — Progress Notes (Signed)
Cardiology Office Note   Date:  05/08/2016   ID:  Margaret Wood, DOB 06/12/1961, MRN [id number]  Referring Doctor:  Ruthe Mannan, MD   Cardiologist:   Almond Lint, MD   Reason for consultation:  Chief Complaint  Patient presents with  . other    C/o rapid heart beat and syncope. Meds reviewed verbally with pt.      History of Present Illness: Margaret Wood is a 55 y.o. female who presents for Evaluation of palpitations and episodes of passing out.  Patient was previously evaluated by cardiologist in 2012 for palpitations. At that time Holter was ordered. Records are not available for review. She was told that she had some form of arrhythmia that is not very worrisome. She was started on metoprolol which she was advised to be taken as needed. She had no follow-up with the cardiologist since then.  Recently for the last 6 months, she has noted worsening of her palpitations. She describes the symptoms being in the center of her chest, she describes it as a pounding sensation in her chest and heart is racing. Nonradiating moderate to severe intensity lasting minutes at a time symptoms are worse with walking or exertion or change in position. She is made aware about the change in heart rate with her apical watch. She has seen heart rates in the 170s as well as heart rate down in the 40s.  She also reports chest pain associated with a rapid heart rate. Also shortness of breath with exertion. Although her symptoms are worse in the last several months.  She reports episode of briefly possibly passing out in a matter of seconds or so. These are not well described or documented or witnessed.  No fever, cough, colds, abdominal pain. No PND, orthopnea, edema.  ROS:  Please see the history of present illness. Aside from mentioned under HPI, all other systems are reviewed and negative.     Past Medical History  Diagnosis Date  . Palpitations   . Fibromyalgia   . Scoliosis   .  Arthritis   . Depression   . H/O [facility] spotted fever   . Mitral valve prolapse     Past Surgical History  Procedure Laterality Date  . Appendectomy  1980  . Cesarean section  1995  . Cholecystectomy  1997     reports that she has never smoked. She has never used smokeless tobacco. She reports that she does not drink alcohol or use illicit drugs.   family history includes Arrhythmia in her paternal uncle.   Current Outpatient Prescriptions  Medication Sig Dispense Refill  . ALPRAZolam (XANAX) 0.25 MG tablet TAKE 1 TABLET BY MOUTH TWICE DAILY (Patient taking differently: TAKE 1 TABLET BY MOUTH AS NEEDED.) 60 tablet 0  . cyclobenzaprine (FLEXERIL) 10 MG tablet TAKE 1 TABLET BY MOUTH AT BEDTIME (Patient taking differently: TAKE 1 TABLET BY MOUTH AS NEEDED.) 30 tablet 2  . dicyclomine (BENTYL) 10 MG capsule Take 10 mg by mouth every 6 (six) hours as needed.     Marland Kitchen estradiol (VIVELLE-DOT) 0.05 MG/24HR patch Place 1 patch (0.05 mg total) onto the skin 2 (two) times a week. (Patient taking differently: Place 1 patch onto the skin as directed. ) 28 patch 12  . hydrocortisone (ANUSOL-HC) 25 MG suppository Place 1 suppository (25 mg total) rectally 2 (two) times daily. 12 suppository 0  . metoprolol tartrate (LOPRESSOR) 25 MG tablet TAKE 1 TABLET BY MOUTH TWICE A DAY (Patient taking  differently: TAKE 1 TABLET BY MOUTH AS NEEDED.) 180 tablet 1  . progesterone (PROMETRIUM) 100 MG capsule TAKE 1 CAPSULE (100 MG TOTAL) BY MOUTH DAILY. 90 capsule 1  . traMADol (ULTRAM) 50 MG tablet TAKE 1 TABLET BY MOUTH EVERY 8 HOURS (Patient taking differently: TAKE 1 TABLET BY MOUTH EVERY TWICE DAILY.) 90 tablet 0   No current facility-administered medications for this visit.    Allergies: Amoxicillin; Cymbalta; Nsaids; and Tegretol    PHYSICAL EXAM: VS:  BP 141/83 mmHg  Pulse 101  Ht 5\' 4"  (1.626 m)  Wt 191 lb (86.637 kg)  BMI 32.77 kg/m2 , Body mass index is 32.77 kg/(m^2). Wt Readings from Last  3 Encounters:  05/08/16 191 lb (86.637 kg)  04/14/16 186 lb 11.2 oz (84.687 kg)  01/05/16 187 lb 8 oz (85.049 kg)    Orthostatic VS for the past 24 hrs (Last 3 readings):  BP- Lying Pulse- Lying BP- Sitting Pulse- Sitting BP- Standing at 0 minutes Pulse- Standing at 0 minutes BP- Standing at 3 minutes Pulse- Standing at 3 minutes  05/08/16 1510 129/88 mmHg 98 149/86 mmHg 99 128/90 mmHg 102 135/82 mmHg 112    GENERAL:  well developed, well nourished, obese, not in acute distress HEENT: normocephalic, pink conjunctivae, anicteric sclerae, no xanthelasma, normal dentition, oropharynx clear NECK:  no neck vein engorgement, JVP normal, no hepatojugular reflux, carotid upstroke brisk and symmetric, no bruit, no thyromegaly, no lymphadenopathy LUNGS:  good respiratory effort, clear to auscultation bilaterally CV:  PMI not displaced, no thrills, no lifts, S1 and S2 within normal limits, no palpable S3 or S4, no murmurs, no rubs, no gallops ABD:  Soft, nontender, nondistended, normoactive bowel sounds, no abdominal aortic bruit, no hepatomegaly, no splenomegaly MS: nontender back, no kyphosis, no scoliosis, no joint deformities EXT:  2+ DP/PT pulses, no edema, no varicosities, no cyanosis, no clubbing SKIN: warm, nondiaphoretic, normal turgor, no ulcers NEUROPSYCH: alert, oriented to person, place, and time, sensory/motor grossly intact, normal mood, appropriate affect  Recent Labs: 09/30/2015: ALT 21 04/14/2016: BUN 13; Creatinine, Ser 1.05*; Hemoglobin 14.3; Platelets 291; Potassium 4.5; Sodium 142   Lipid Panel No results found for: CHOL, TRIG, HDL, CHOLHDL, VLDL, LDLCALC, LDLDIRECT   Other studies Reviewed:  EKG:  The ekg from 05/08/2016 was personally reviewed by me and it revealed sinus tachycardia 101 BPM, low voltage QRS.  EKGs dated 04/14/2016, and 04/18/2016 were personally reviewed by me and showed sinus rhythm, not accelerated junctional rhythm.  Additional studies/ records that  were reviewed personally reviewed by me today include: None available   ASSESSMENT AND PLAN:  Palpitations Shortness of breath Chest pain Question of syncope  Recommend objective evidence from Holter monitor. Recommend echocardiogram to evaluate for structural heart issues. Explained recommend ischemic evaluation with exercise nuclear stress test. If patient unable to complete the treadmill protocol, switch to pharmacologic nuclear stress test. There may be element of orthostatic change in her blood pressure and heart rate. Recommend increase hydration.  Current medicines are reviewed at length with the patient today.  The patient does not have concerns regarding medicines.  Labs/ tests ordered today include:  Orders Placed This Encounter  Procedures  . NM Myocar Multi W/Spect W/Wall Motion / EF  . Holter monitor - 24 hour  . EKG 12-Lead  . ECHOCARDIOGRAM COMPLETE    I had a lengthy and detailed discussion with the patient regarding diagnoses, prognosis, diagnostic options, treatment options .   I counseled the patient on importance of lifestyle modification  including heart healthy diet, regular physical activity .   Disposition:   FU with undersigned after tests   Signed, Almond Lint, MD  05/08/2016 4:10 PM    [facility]

## 2016-05-08 NOTE — Telephone Encounter (Signed)
Pt spouse calling stating they would like to move Myoview 29/30 at 830  Please advise.

## 2016-05-08 NOTE — Patient Instructions (Addendum)
Medication Instructions:  Your physician recommends that you continue on your current medications as directed. Please refer to the Current Medication list given to you today.   Labwork: None ordered  Testing/Procedures: Your physician has requested that you have an echocardiogram. Echocardiography is a painless test that uses sound waves to create images of your heart. It provides your doctor with information about the size and shape of your heart and how well your heart's chambers and valves are working. This procedure takes approximately one hour. There are no restrictions for this procedure.  Date & Time: __________________________________________________________  Your physician has recommended that you wear a holter monitor. Holter monitors are medical devices that record the heart's electrical activity. Doctors most often use these monitors to diagnose arrhythmias. Arrhythmias are problems with the speed or rhythm of the heartbeat. The monitor is a small, portable device. You can wear one while you do your normal daily activities. This is usually used to diagnose what is causing palpitations/syncope (passing out).  Date & Time: _____________________________________________________________  [facility] MY[facility]IEW  Your caregiver has ordered a Stress Test with nuclear imaging. The purpose of this test is to evaluate the blood supply to your heart muscle. Cardiac stress tests are done to find areas of poor blood flow to the heart by determining the extent of coronary artery disease (CAD).    Please note: these test may take anywhere between 2-4 hours to complete  PLE[facility] REPORT TO [facility]  THE VOLUNTEERS AT THE FIRST DESK WILL DIRECT YOU WHERE TO GO  Date of Procedure:___Thursday May 18, 2016 at 08:30AM___  Arrival Time for Procedure:____Arrive at 08:15AM to register________  Instructions regarding medication:   _X_:  Hold Metoprolol the night before procedure and morning of  procedure    PLEASE NOTIFY THE OFFICE AT LEAST 24 HOURS IN ADVANCE IF YOU ARE UNABLE TO KEEP YOUR APPOINTMENT.  [phone] AND  PLEASE NOTIFY [facility] AT [facility] AT LEAST 24 HOURS IN ADVANCE IF YOU ARE UNABLE TO KEEP YOUR APPOINTMENT. [phone]  How to prepare for your Myoview test:   Do not eat or drink after midnight  No caffeine for 24 hours prior to test  No smoking 24 hours prior to test.  Your medication may be taken with water.  If your doctor stopped a medication because of this test, do not take that medication.  Ladies, please do not wear dresses.  Skirts or pants are appropriate. Please wear a short sleeve shirt.  No perfume, cologne or lotion.  Wear comfortable walking shoes. No heels!       Follow-Up: Your physician recommends that you schedule a follow-up appointment after testing to review results.  Date & Time: ___________________________________________________   Any Other Special Instructions Will Be Listed Below (If Applicable).     If you need a refill on your cardiac medications before your next appointment, please call your pharmacy.  Echocardiogram An echocardiogram, or echocardiography, uses sound waves (ultrasound) to produce an image of your heart. The echocardiogram is simple, painless, obtained within a short period of time, and offers valuable information to your health care provider. The images from an echocardiogram can provide information such as:  Evidence of coronary artery disease (CAD).  Heart size.  Heart muscle function.  Heart valve function.  Aneurysm detection.  Evidence of a past heart attack.  Fluid buildup around the heart.  Heart muscle thickening.  Assess heart valve function. LET [facility] Med[facility] HEALTH CARE PROVIDER KNOW ABOUT:  Any allergies you have.  All medicines you are taking, including vitamins, herbs, eye drops, creams, and over-the-counter medicines.  Previous problems you or members of your  family have had with the use of anesthetics.  Any blood disorders you have.  Previous surgeries you have had.  Medical conditions you have.  Possibility of pregnancy, if this applies. BEFORE THE PROCEDURE  No special preparation is needed. Eat and drink normally.  PROCEDURE   In order to produce an image of your heart, gel will be applied to your chest and a wand-like tool (transducer) will be moved over your chest. The gel will help transmit the sound waves from the transducer. The sound waves will harmlessly bounce off your heart to allow the heart images to be captured in real-time motion. These images will then be recorded.  You may need an IV to receive a medicine that improves the quality of the pictures. AFTER THE PROCEDURE You may return to your normal schedule including diet, activities, and medicines, unless your health care provider tells you otherwise.   This information is not intended to replace advice given to you by your health care provider. Make sure you discuss any questions you have with your health care provider.   Document Released: 11/10/2000 Document Revised: 12/04/2014 Document Reviewed: 07/21/2013 Elsevier Interactive Patient Education 2016 Elsevier Inc.     Holter Monitoring A Holter monitor is a small device that is used to detect abnormal heart rhythms. It clips to your clothing and is connected by wires to flat, sticky disks (electrodes) that attach to your chest. It is worn continuously for 24-48 hours. HOME CARE INSTRUCTIONS  Wear your Holter monitor at all times, even while exercising and sleeping, for as long as directed by your health care provider.  Make sure that the Holter monitor is safely clipped to your clothing or close to your body as recommended by your health care provider.  Do not get the monitor or wires wet.  Do not put body lotion or moisturizer on your chest.  Keep your skin clean.  Keep a diary of your daily activities, such  as walking and doing chores. If you feel that your heartbeat is abnormal or that your heart is fluttering or skipping a beat:  Record what you are doing when it happens.  Record what time of day the symptoms occur.  Return your Holter monitor as directed by your health care provider.  Keep all follow-up visits as directed by your health care provider. This is important. SEEK IMMEDIATE MEDICAL CARE IF:  You feel lightheaded or you faint.  You have trouble breathing.  You feel pain in your chest, upper arm, or jaw.  You feel sick to your stomach and your skin is pale, cool, or damp.  You heartbeat feels unusual or abnormal.   This information is not intended to replace advice given to you by your health care provider. Make sure you discuss any questions you have with your health care provider.   Document Released: 08/11/2004 Document Revised: 12/04/2014 Document Reviewed: 06/22/2014 Elsevier Interactive Patient Education 2016 Elsevier Inc.      Cardiac Nuclear Scanning A cardiac nuclear scan is used to check your heart for problems, such as the following:  A portion of the heart is not getting enough blood.  Part of the heart muscle has died, which happens with a heart attack.  The heart wall is not working normally.  In this test, a radioactive dye (tracer) is injected into your bloodstream. After the tracer has  traveled to your heart, a scanning device is used to measure how much of the tracer is absorbed by or distributed to various areas of your heart. LET [facility] CARE PROVIDER KNOW ABOUT:  Any allergies you have.  All medicines you are taking, including vitamins, herbs, eye drops, creams, and over-the-counter medicines.  Previous problems you or members of your family have had with the use of anesthetics.  Any blood disorders you have.  Previous surgeries you have had.  Medical conditions you have.  RISKS AND COMPLICATIONS Generally, this is a safe  procedure. However, as with any procedure, problems can occur. Possible problems include:   Serious chest pain.  Rapid heartbeat.  Sensation of warmth in your chest. This usually passes quickly. BEFORE THE PROCEDURE Ask your health care provider about changing or stopping your regular medicines. PROCEDURE This procedure is usually done at a hospital and takes 2-4 hours.  An IV tube is inserted into one of your veins.  Your health care provider will inject a small amount of radioactive tracer through the tube.  You will then wait for 20-40 minutes while the tracer travels through your bloodstream.  You will lie down on an exam table so images of your heart can be taken. Images will be taken for about 15-20 minutes.  You will exercise on a treadmill or stationary bike. While you exercise, your heart activity will be monitored with an electrocardiogram (ECG), and your blood pressure will be checked.  If you are unable to exercise, you may be given a medicine to make your heart beat faster.  When blood flow to your heart has peaked, tracer will again be injected through the IV tube.  After 20-40 minutes, you will get back on the exam table and have more images taken of your heart.  When the procedure is over, your IV tube will be removed. AFTER THE PROCEDURE  You will likely be able to leave shortly after the test. Unless your health care provider tells you otherwise, you may return to your normal schedule, including diet, activities, and medicines.  Make sure you find out how and when you will get your test results.   This information is not intended to replace advice given to you by your health care provider. Make sure you discuss any questions you have with your health care provider.   Document Released: 12/08/2004 Document Revised: 11/18/2013 Document Reviewed: 10/22/2013 Elsevier Interactive Patient Education [employer]

## 2016-05-09 ENCOUNTER — Telehealth: Payer: Self-pay | Admitting: Cardiology

## 2016-05-09 ENCOUNTER — Other Ambulatory Visit: Payer: Self-pay | Admitting: Family Medicine

## 2016-05-09 NOTE — Telephone Encounter (Signed)
Pt spouse called and asked to reschedule June 22 myoview. Rescheduled for June 30, 8:30am

## 2016-05-10 NOTE — Telephone Encounter (Signed)
Last f/u 12/2015 

## 2016-05-10 NOTE — Telephone Encounter (Signed)
Rx called in to requested pharmacy 

## 2016-05-18 ENCOUNTER — Other Ambulatory Visit: Payer: Self-pay

## 2016-05-19 ENCOUNTER — Ambulatory Visit (INDEPENDENT_AMBULATORY_CARE_PROVIDER_SITE_OTHER): Payer: BLUE CROSS/BLUE SHIELD

## 2016-05-19 ENCOUNTER — Other Ambulatory Visit: Payer: Self-pay

## 2016-05-19 DIAGNOSIS — R55 Syncope and collapse: Secondary | ICD-10-CM | POA: Diagnosis not present

## 2016-05-19 DIAGNOSIS — R002 Palpitations: Secondary | ICD-10-CM

## 2016-05-19 LAB — ECHOCARDIOGRAM COMPLETE
AOASC: 33 cm
CHL CUP DOP CALC LVOT VTI: 17.9 cm
CHL CUP MV DEC (S): 320
CHL CUP STROKE VOLUME: 48 mL
EERAT: 6.71
EWDT: 320 ms
FS: 43 % (ref 28–44)
IV/PV OW: 1.21
LA diam end sys: 31 mm
LA diam index: 1.54 cm/m2
LA vol: 47.2 mL
LASIZE: 31 mm
LAVOLA4C: 44.9 mL
LAVOLIN: 23.5 mL/m2
LDCA: 3.46 cm2
LV E/e'average: 6.71
LV e' LATERAL: 12 cm/s
LVDIAVOL: 84 mL (ref 46–106)
LVDIAVOLIN: 42 mL/m2
LVEEMED: 6.71
LVOT diameter: 21 mm
LVOTPV: 92.4 cm/s
LVOTSV: 62 mL
LVSYSVOL: 36 mL (ref 14–42)
LVSYSVOLIN: 18 mL/m2
MV Peak grad: 3 mmHg
MV pk A vel: 71.3 m/s
MV pk E vel: 80.5 m/s
PW: 6.87 mm — AB (ref 0.6–1.1)
RV TAPSE: 18.6 mm
Simpson's disk: 57
TDI e' lateral: 12
TDI e' medial: 8.05

## 2016-05-22 ENCOUNTER — Ambulatory Visit
Admission: RE | Admit: 2016-05-22 | Discharge: 2016-05-22 | Disposition: A | Discharge status: Home / Self Care | Payer: BLUE CROSS/BLUE SHIELD | Source: Ambulatory Visit | Attending: Cardiology | Admitting: Cardiology

## 2016-05-22 DIAGNOSIS — R55 Syncope and collapse: Secondary | ICD-10-CM | POA: Diagnosis present

## 2016-05-22 DIAGNOSIS — R002 Palpitations: Secondary | ICD-10-CM | POA: Insufficient documentation

## 2016-05-22 DIAGNOSIS — R0789 Other chest pain: Secondary | ICD-10-CM | POA: Diagnosis not present

## 2016-05-22 DIAGNOSIS — R0602 Shortness of breath: Secondary | ICD-10-CM | POA: Diagnosis not present

## 2016-05-25 ENCOUNTER — Telehealth: Payer: Self-pay | Admitting: Cardiology

## 2016-05-25 NOTE — Telephone Encounter (Signed)
Patient wants to confirm what time to be at [facility] for nm test .  Please call husband.

## 2016-05-25 NOTE — Telephone Encounter (Signed)
Reviewed myoview instructions with pt spouse, Jeannett SeniorStephen, who verbalized understanding. She will arrive at 8:15am tomorrow. Spouse had no further questions.

## 2016-05-26 ENCOUNTER — Encounter
Admission: RE | Admit: 2016-05-26 | Discharge: 2016-05-26 | Disposition: A | Discharge status: Home / Self Care | Payer: BLUE CROSS/BLUE SHIELD | Source: Ambulatory Visit | Attending: Cardiology | Admitting: Cardiology

## 2016-05-26 DIAGNOSIS — R002 Palpitations: Secondary | ICD-10-CM

## 2016-05-26 DIAGNOSIS — R55 Syncope and collapse: Secondary | ICD-10-CM

## 2016-05-26 LAB — HCG, QUANTITATIVE, PREGNANCY: hCG, Beta Chain, Quant, S: 1 m[IU]/mL (ref <5)

## 2016-05-26 MED ORDER — REGADENOSON 0.4 MG/5ML IV SOLN
0.4000 mg | Freq: Once | INTRAVENOUS | Status: AC
  Administered 2016-05-26: 0.4 mg via INTRAVENOUS

## 2016-05-26 MED ORDER — TECHNETIUM TC 99M TETROFOSMIN IV KIT
12.6600 | PACK | Freq: Once | INTRAVENOUS | Status: AC | PRN
  Administered 2016-05-26: 12.66 via INTRAVENOUS

## 2016-05-26 MED ORDER — TECHNETIUM TC 99M TETROFOSMIN IV KIT
30.6500 | PACK | Freq: Once | INTRAVENOUS | Status: AC | PRN
  Administered 2016-05-26: 30.65 via INTRAVENOUS

## 2016-05-27 LAB — NM MYOCAR MULTI W/SPECT W/WALL MOTION / EF
CHL CUP MPHR: 168 {beats}/min
CHL CUP NUCLEAR SDS: 3
CHL CUP RESTING HR STRESS: 80 {beats}/min
CSEPEDS: 0 s
CSEPEW: 3.4 METS
Exercise duration (min): 2 min
LVDIAVOL: 57 mL (ref 46–106)
LVSYSVOL: 30 mL
Peak HR: 134 {beats}/min
Percent HR: 79 %
SRS: 3
SSS: 7
TID: 0.82

## 2016-05-30 ENCOUNTER — Other Ambulatory Visit: Payer: Self-pay | Admitting: Family Medicine

## 2016-05-31 NOTE — Telephone Encounter (Signed)
Pt has not had BP f/u in 3+yrs

## 2016-06-01 ENCOUNTER — Ambulatory Visit: Payer: Self-pay | Admitting: Cardiology

## 2016-06-01 DIAGNOSIS — R0989 Other specified symptoms and signs involving the circulatory and respiratory systems: Secondary | ICD-10-CM

## 2016-06-02 ENCOUNTER — Telehealth: Payer: Self-pay | Admitting: *Deleted

## 2016-06-02 ENCOUNTER — Telehealth: Payer: Self-pay | Admitting: Cardiology

## 2016-06-02 DIAGNOSIS — R000 Tachycardia, unspecified: Secondary | ICD-10-CM

## 2016-06-02 MED ORDER — METOPROLOL TARTRATE 25 MG PO TABS: 25.0000 mg | ORAL_TABLET | Freq: Two times a day (BID) | ORAL | Status: DC

## 2016-06-02 NOTE — Telephone Encounter (Signed)
Please call re: test results.  Please leave detailed msg on vm if no answer.

## 2016-06-02 NOTE — Telephone Encounter (Signed)
Reviewed results with patient along with recommendations from Dr. Alvino ChapelIngal. Sent in prescription for Metoprolol for her with refills per her request. Instructed her to start keeping a log of her blood pressures with heart rate and bring to her next appointment. Transferred her to front office to set up an appointment for labs to also be done. She verbalized understanding of our conversation, plan of care, changes, and follow up appointment with no further questions at this time.

## 2016-06-02 NOTE — Telephone Encounter (Signed)
-----   Message from Almond LintAileen Ingal, MD sent at 05/31/2016  2:30 PM EDT ----- Sinus rhythm, but elevated average heart rate. No significant arrhythmia. Patient was prescribed metoprolol titrate by PCP. Patient only taking this when necessary. Recommend to take the metoprolol tartrate 25 mg twice a day. Monitor blood pressure. Recommend to check TSH and free T4

## 2016-06-02 NOTE — Telephone Encounter (Signed)
Duplicate. See result notes. Left voicemail message for patient to call back in to review results.

## 2016-06-04 ENCOUNTER — Encounter: Payer: Self-pay | Admitting: Family Medicine

## 2016-06-05 ENCOUNTER — Other Ambulatory Visit (INDEPENDENT_AMBULATORY_CARE_PROVIDER_SITE_OTHER): Payer: BLUE CROSS/BLUE SHIELD

## 2016-06-05 ENCOUNTER — Other Ambulatory Visit: Payer: Self-pay | Admitting: Family Medicine

## 2016-06-05 DIAGNOSIS — R000 Tachycardia, unspecified: Secondary | ICD-10-CM | POA: Diagnosis not present

## 2016-06-05 MED ORDER — ONDANSETRON HCL 4 MG PO TABS: 4.0000 mg | ORAL_TABLET | Freq: Three times a day (TID) | ORAL | Status: DC | PRN

## 2016-06-06 LAB — T4, FREE: FREE T4: 1.11 ng/dL (ref 0.82–1.77)

## 2016-06-06 LAB — TSH: TSH: 3.13 u[IU]/mL (ref 0.450–4.500)

## 2016-06-21 ENCOUNTER — Ambulatory Visit: Payer: Self-pay | Admitting: Cardiology

## 2016-06-25 ENCOUNTER — Other Ambulatory Visit: Payer: Self-pay | Admitting: Family Medicine

## 2016-06-26 ENCOUNTER — Telehealth: Payer: Self-pay | Admitting: Family Medicine

## 2016-06-26 NOTE — Telephone Encounter (Signed)
Last f/u 09/2015 

## 2016-06-26 NOTE — Telephone Encounter (Signed)
Rx called in to requested pharmacy 

## 2016-06-26 NOTE — Telephone Encounter (Signed)
Tobi Bastos from [facility] left VM on Triage phone requesting "How Often" for GERD pantoprazole (PROTONIX) 40 MG tablet .  Best number to call is [phone]

## 2016-06-27 ENCOUNTER — Ambulatory Visit (INDEPENDENT_AMBULATORY_CARE_PROVIDER_SITE_OTHER): Payer: BLUE CROSS/BLUE SHIELD | Admitting: Cardiology

## 2016-06-27 ENCOUNTER — Encounter: Payer: Self-pay | Admitting: Cardiology

## 2016-06-27 ENCOUNTER — Other Ambulatory Visit: Payer: Self-pay

## 2016-06-27 VITALS — BP 126/80 | HR 104 | Ht 64.0 in | Wt 195.8 lb

## 2016-06-27 DIAGNOSIS — I340 Nonrheumatic mitral (valve) insufficiency: Secondary | ICD-10-CM

## 2016-06-27 DIAGNOSIS — R002 Palpitations: Secondary | ICD-10-CM

## 2016-06-27 DIAGNOSIS — R000 Tachycardia, unspecified: Secondary | ICD-10-CM

## 2016-06-27 MED ORDER — DILTIAZEM HCL 30 MG PO TABS: 30.0000 mg | ORAL_TABLET | Freq: Four times a day (QID) | ORAL | 6 refills | Status: DC

## 2016-06-27 NOTE — Telephone Encounter (Signed)
[facility] left v/m requesting to resubmit pantoprazole refill with frequency instructions; should pt take once daily or twice daily.Please advise.

## 2016-06-27 NOTE — Progress Notes (Signed)
Cardiology Office Note   Date:  06/27/2016   ID:  Margaret Wood, DOB September 25, 1961, MRN [id number]  Referring Doctor:  Ruthe Mannan, MD   Cardiologist:   Almond Lint, MD   Reason for consultation:  Chief Complaint  Patient presents with  . Follow-up    no cp, no sob and no swelling. no other complaints.      History of Present Illness: Margaret Wood is a 55 y.o. female who presents for Follow-up after tests.  Since her last visit, she has tried taking the metoprolol as recommended to twice a day. She can only tolerate taking it once a day because of significant nausea. She did however reports an improvement in her palpitations. She continues to feel winded when her heart rate goes up.   Patient was previously evaluated by cardiologist in 2012 for palpitations. At that time Holter was ordered. Records are not available for review. She was told that she had some form of arrhythmia that is not very worrisome. She was started on metoprolol which she was advised to be taken as needed. She had no follow-up with the cardiologist since then.  No fever, cough, colds, abdominal pain. No PND, orthopnea, edema.  ROS:  Please see the history of present illness. Aside from mentioned under HPI, all other systems are reviewed and negative.     Past Medical History:  Diagnosis Date  . Arthritis   . Depression   . Fibromyalgia   . H/O [facility] spotted fever   . Mitral valve prolapse   . Palpitations   . Scoliosis     Past Surgical History:  Procedure Laterality Date  . APPENDECTOMY  1980  . CESAREAN SECTION  1995  . CHOLECYSTECTOMY  1997     reports that she has never smoked. She has never used smokeless tobacco. She reports that she does not drink alcohol or use drugs.   family history includes Arrhythmia in her paternal uncle.   Current Outpatient Prescriptions  Medication Sig Dispense Refill  . ALPRAZolam (XANAX) 0.25 MG tablet TAKE 1 TABLET BY MOUTH TWICE A DAY  60 tablet 0  . cyclobenzaprine (FLEXERIL) 10 MG tablet TAKE 1 TABLET BY MOUTH AT BEDTIME 30 tablet 2  . dicyclomine (BENTYL) 10 MG capsule Take 10 mg by mouth every 6 (six) hours as needed.     Marland Kitchen estradiol (VIVELLE-DOT) 0.05 MG/24HR patch Place 1 patch (0.05 mg total) onto the skin 2 (two) times a week. (Patient taking differently: Place 1 patch onto the skin as directed. ) 28 patch 12  . hydrocortisone (ANUSOL-HC) 25 MG suppository UNWRAP AND INSERT 1 SUPPOSITORY (25 MG TOTAL) RECTALLY 2 (TWO) TIMES DAILY. 12 suppository 0  . metoprolol tartrate (LOPRESSOR) 25 MG tablet Take 1 tablet (25 mg total) by mouth 2 (two) times daily. 60 tablet 6  . ondansetron (ZOFRAN) 4 MG tablet Take 1 tablet (4 mg total) by mouth every 8 (eight) hours as needed for nausea or vomiting. 20 tablet 0  . pantoprazole (PROTONIX) 40 MG tablet TAKE 1 TABLET (40 MG TOTAL) BY MOUTH AS NEEDED. FOR GERD. 90 tablet 0  . progesterone (PROMETRIUM) 100 MG capsule TAKE 1 CAPSULE (100 MG TOTAL) BY MOUTH DAILY. 90 capsule 1  . traMADol (ULTRAM) 50 MG tablet TAKE 1 TABLET BY MOUTH EVERY 8 HOURS 90 tablet 0  . diltiazem (CARDIZEM) 30 MG tablet Take 1 tablet (30 mg total) by mouth 4 (four) times daily. 120 tablet 6   No  current facility-administered medications for this visit.     Allergies: Amoxicillin; Cymbalta [duloxetine hcl]; Nsaids; and Tegretol [carbamazepine]    PHYSICAL EXAM: VS:  BP 126/80 (BP Location: Left Arm, Patient Position: Sitting, Cuff Size: Normal)   Pulse (!) 104   Ht 5\' 4"  (1.626 m)   Wt 195 lb 12.8 oz (88.8 kg)   BMI 33.61 kg/m  , Body mass index is 33.61 kg/m. Wt Readings from Last 3 Encounters:  06/27/16 195 lb 12.8 oz (88.8 kg)  05/08/16 191 lb (86.6 kg)  04/14/16 186 lb 11.2 oz (84.7 kg)    No data found.   GENERAL:  well developed, well nourished, obese, not in acute distress HEENT: normocephalic, pink conjunctivae, anicteric sclerae, no xanthelasma, normal dentition, oropharynx clear NECK:  no  neck vein engorgement, JVP normal, no hepatojugular reflux, carotid upstroke brisk and symmetric, no bruit, no thyromegaly, no lymphadenopathy LUNGS:  good respiratory effort, clear to auscultation bilaterally CV:  PMI not displaced, no thrills, no lifts, S1 and S2 within normal limits, no palpable S3 or S4, no murmurs, no rubs, no gallops ABD:  Soft, nontender, nondistended, normoactive bowel sounds, no abdominal aortic bruit, no hepatomegaly, no splenomegaly MS: nontender back, no kyphosis, no scoliosis, no joint deformities EXT:  2+ DP/PT pulses, no edema, no varicosities, no cyanosis, no clubbing SKIN: warm, nondiaphoretic, normal turgor, no ulcers NEUROPSYCH: alert, oriented to person, place, and time, sensory/motor grossly intact, normal mood, appropriate affect  Recent Labs: 09/30/2015: ALT 21 04/14/2016: BUN 13; Creatinine, Ser 1.05; Hemoglobin 14.3; Platelets 291; Potassium 4.5; Sodium 142 06/05/2016: TSH 3.130   Lipid Panel No results found for: CHOL, TRIG, HDL, CHOLHDL, VLDL, LDLCALC, LDLDIRECT   Other studies Reviewed:  EKG:  The ekg from 05/08/2016 was personally reviewed by me and it revealed sinus tachycardia 101 BPM, low voltage QRS.  EKGs dated 04/14/2016, and 04/18/2016 were personally reviewed by me and showed sinus rhythm, not accelerated junctional rhythm.  Additional studies/ records that were reviewed personally reviewed by me today include: echo 05/19/2016: Left ventricle: The cavity size was normal. Systolic function was   normal. The estimated ejection fraction was in the range of 60%   to 65%. Wall motion was normal; there were no regional wall   motion abnormalities. Left ventricular diastolic function   parameters were normal. - Mitral valve: There was mild to moderate regurgitation. - Right ventricle: Systolic function was normal. - Pulmonary arteries: Systolic pressure was within the normal   range.  Impressions:  - Normal study.  Holter  05/19/2016: 24-hour Holter monitor reveals overall rhythm to be sinus. Heart rate ranged from 78-161 BPM, average of 105 BPM. Maximal heart rate of 161 BPM, sinus tachycardia at 1550 05/19/2016. 61% of total number of beats were in tachycardia.  No high grade supraventricular ectopy: 2 isolated PACs, one atrial couplet.  No high-grade ventricular ectopy: 14 isolated PVCs.   Nuclear stress test 05/26/2016: Pharmacological myocardial perfusion imaging study with no significant  Ischemia Patient tried exercise myoview, but target heart rate could not be achieved Significant GI uptake artifact noted Normal wall motion, EF estimated at 71% No EKG changes concerning for ischemia at peak stress or in recovery. Low risk scan   ASSESSMENT AND PLAN:  Palpitations Shortness of breath Chest pain   LVEF is within normal limits. No ischemia on nuclear stress test. These findings reveal a low likelihood of clinically significant CAD. Patient reassured Holter reveals elevated average heart rate but no arrhythmia. No significant ventricular or  supraventricular ectopy. Recommend to continue metoprolol, 25 once a day as the only thing that she can tolerate. Discussed trying another medication, Cardizem 30 mg 4 times a day. She will start to take this once a day and build up as tolerated.  Follow-up in 3 months time for this.  Mitral regurgitation Mild to moderate Serial evaluation recommended likely in one to 2 years.  Current medicines are reviewed at length with the patient today.  The patient does not have concerns regarding medicines.  Labs/ tests ordered today include:  No orders of the defined types were placed in this encounter.   I had a lengthy and detailed discussion with the patient regarding diagnoses, prognosis, diagnostic options, treatment options .   I counseled the patient on importance of lifestyle modification including heart healthy diet, regular physical activity  .   Disposition:   FU with undersigned in 3 months  Signed, Almond Lint, MD  06/27/2016 12:31 PM    [facility]

## 2016-06-27 NOTE — Patient Instructions (Signed)
Medication Instructions:  Your physician has recommended you make the following change in your medication:  1. Start Cardizem 30 mg four times a day. Start out once daily and increase as tolerated.    Follow-Up: Your physician recommends that you schedule a follow-up appointment in: 3 months with Dr. Alvino Chapel.  It was a pleasure seeing you today here in the office. Please do not hesitate to give [place] a call back if you have any further questions. [phone]  Govan Cellar RN, BSN      Any Other Special Instructions Will Be Listed Below (If Applicable).     If you need a refill on your cardiac medications before your next appointment, please call your pharmacy.

## 2016-06-27 NOTE — Telephone Encounter (Signed)
Once daily

## 2016-06-28 MED ORDER — PANTOPRAZOLE SODIUM 40 MG PO TBEC: 40.0000 mg | DELAYED_RELEASE_TABLET | Freq: Every day | ORAL | 0 refills | Status: DC | PRN

## 2016-06-28 NOTE — Telephone Encounter (Signed)
New Rx sent to pharmacy with new instruction

## 2016-08-06 ENCOUNTER — Other Ambulatory Visit: Payer: Self-pay | Admitting: Family Medicine

## 2016-08-08 ENCOUNTER — Other Ambulatory Visit: Payer: Self-pay | Admitting: Family Medicine

## 2016-08-08 NOTE — Telephone Encounter (Signed)
Last f/u 12/2015 

## 2016-08-09 NOTE — Telephone Encounter (Signed)
Rx called in to requested pharmacy 

## 2016-09-18 ENCOUNTER — Other Ambulatory Visit: Payer: Self-pay | Admitting: Family Medicine

## 2016-09-18 NOTE — Telephone Encounter (Signed)
Last f/u 12/2015 

## 2016-09-19 NOTE — Telephone Encounter (Signed)
Rx called in to requested pharmacy 

## 2016-09-28 ENCOUNTER — Ambulatory Visit: Payer: Self-pay | Admitting: Cardiology

## 2016-10-18 ENCOUNTER — Other Ambulatory Visit: Payer: Self-pay | Admitting: Family Medicine

## 2016-10-23 NOTE — Telephone Encounter (Signed)
Last f/u 09/2015 

## 2016-10-24 ENCOUNTER — Other Ambulatory Visit: Payer: Self-pay | Admitting: Family Medicine

## 2016-10-24 NOTE — Telephone Encounter (Signed)
Rx called in to requested pharmacy 

## 2016-10-25 MED ORDER — PANTOPRAZOLE SODIUM 40 MG PO TBEC: 40.0000 mg | DELAYED_RELEASE_TABLET | Freq: Every day | ORAL | 0 refills | Status: DC | PRN

## 2016-10-25 NOTE — Addendum Note (Signed)
Addended by: Desmond DikeKNIGHT, Amberlee Garvey H on: 10/25/2016 08:28 AM   Modules accepted: Orders

## 2016-11-16 ENCOUNTER — Other Ambulatory Visit: Payer: Self-pay | Admitting: Family Medicine

## 2016-11-16 NOTE — Telephone Encounter (Signed)
Last f/u 04/2015 

## 2016-11-22 ENCOUNTER — Other Ambulatory Visit: Payer: Self-pay | Admitting: Family Medicine

## 2016-11-23 NOTE — Telephone Encounter (Signed)
Last f/u 09/2015 

## 2016-11-24 NOTE — Telephone Encounter (Signed)
Rx called in to requested pharmacy 

## 2016-11-30 ENCOUNTER — Other Ambulatory Visit: Payer: Self-pay | Admitting: Family Medicine

## 2016-12-01 NOTE — Telephone Encounter (Signed)
Pt has not had f/u regarding hormone replacement since 04/2015

## 2016-12-26 ENCOUNTER — Other Ambulatory Visit: Payer: Self-pay | Admitting: Family Medicine

## 2016-12-27 NOTE — Telephone Encounter (Signed)
Last f/u 12/2015 

## 2016-12-27 NOTE — Telephone Encounter (Signed)
Rx called in to requested pharmacy 

## 2017-01-26 ENCOUNTER — Other Ambulatory Visit: Payer: Self-pay | Admitting: Family Medicine

## 2017-01-27 ENCOUNTER — Other Ambulatory Visit: Payer: Self-pay | Admitting: Family Medicine

## 2017-01-29 NOTE — Telephone Encounter (Signed)
Spoke to pt. Made appt. 

## 2017-01-29 NOTE — Telephone Encounter (Signed)
Tramadol last filled 12-27-16 #90 Alprazolam last filled 12-27-16 #60  Last OV 01-05-16 No Future OV

## 2017-01-29 NOTE — Telephone Encounter (Signed)
Ok to refill both rxs one time only.  Needs OV for further refills.

## 2017-01-29 NOTE — Telephone Encounter (Signed)
Left refill on voice mail at pharmacy  Left message on VM advising pt to make an office.

## 2017-02-18 ENCOUNTER — Other Ambulatory Visit: Payer: Self-pay | Admitting: Cardiology

## 2017-02-19 ENCOUNTER — Ambulatory Visit: Payer: BLUE CROSS/BLUE SHIELD | Admitting: Family Medicine

## 2017-02-19 NOTE — Telephone Encounter (Signed)
Pt needs f/u appt with Ingal. Thank you.

## 2017-02-20 ENCOUNTER — Encounter: Payer: Self-pay | Admitting: Family Medicine

## 2017-02-20 ENCOUNTER — Ambulatory Visit (INDEPENDENT_AMBULATORY_CARE_PROVIDER_SITE_OTHER): Payer: BLUE CROSS/BLUE SHIELD | Admitting: Family Medicine

## 2017-02-20 VITALS — BP 132/88 | HR 108 | Temp 98.6°F | Ht 64.0 in | Wt 184.0 lb

## 2017-02-20 DIAGNOSIS — F411 Generalized anxiety disorder: Secondary | ICD-10-CM

## 2017-02-20 DIAGNOSIS — M797 Fibromyalgia: Secondary | ICD-10-CM | POA: Diagnosis not present

## 2017-02-20 MED ORDER — ALPRAZOLAM 0.25 MG PO TABS: 0.2500 mg | ORAL_TABLET | Freq: Two times a day (BID) | ORAL | 0 refills | Status: DC

## 2017-02-20 MED ORDER — TRAMADOL HCL 50 MG PO TABS: 50.0000 mg | ORAL_TABLET | Freq: Three times a day (TID) | ORAL | 0 refills | Status: DC

## 2017-02-20 MED ORDER — ONDANSETRON HCL 4 MG PO TABS
4.0000 mg | ORAL_TABLET | Freq: Three times a day (TID) | ORAL | 0 refills | Status: DC | PRN
Start: 2017-02-20 — End: 2017-04-05

## 2017-02-20 NOTE — Progress Notes (Signed)
Subjective:   Patient ID: Margaret Wood, female    DOB: 06-15-1961, 56 y.o.   MRN: [id number]  Margaret Wood is a pleasant 56 y.o. year old female who presents to clinic today with her husband for Medication Refill  on 02/20/2017  HPI:   Fibromyalgia- still needing to take tramadol daily.  Unfortunately, symptoms have deteriorated lately.  She is still working on diet and weight loss.  Allergic to Lyrica and Cymbalta- causes extreme sedation.  She has started walking again which helps.  Pain is mainly in her shoulders and hips.  Seeing cardiology for palpitations, this is worsening her anxiety.   Wt Readings from Last 3 Encounters:  02/20/17 184 lb (83.5 kg)  06/27/16 195 lb 12.8 oz (88.8 kg)  05/08/16 191 lb (86.6 kg)     Current Outpatient Prescriptions on File Prior to Visit  Medication Sig Dispense Refill  . cyclobenzaprine (FLEXERIL) 10 MG tablet TAKE 1 TABLET BY MOUTH AT BEDTIME 30 tablet 2  . dicyclomine (BENTYL) 10 MG capsule Take 10 mg by mouth every 6 (six) hours as needed.     . diltiazem (CARDIZEM) 30 MG tablet TAKE 1 TABLET (30 MG TOTAL) BY MOUTH 4 (FOUR) TIMES DAILY. 120 tablet 0  . estradiol (VIVELLE-DOT) 0.05 MG/24HR patch PLACE 1 PATCH (0.05 MG TOTAL) ONTO THE SKIN 2 (TWO) TIMES A WEEK. 28 patch 3  . hydrocortisone (ANUSOL-HC) 25 MG suppository UNWRAP AND INSERT 1 SUPPOSITORY (25 MG TOTAL) RECTALLY 2 (TWO) TIMES DAILY. 12 suppository 0  . metoprolol tartrate (LOPRESSOR) 25 MG tablet TAKE 1 TABLET BY MOUTH TWICE A DAY 180 tablet 0  . pantoprazole (PROTONIX) 40 MG tablet Take 1 tablet (40 mg total) by mouth daily as needed. For GERD. 90 tablet 0  . progesterone (PROMETRIUM) 100 MG capsule TAKE 1 CAPSULE DAILY 90 capsule 1   No current facility-administered medications on file prior to visit.     Allergies  Allergen Reactions  . Amoxicillin Nausea Only  . Cymbalta [Duloxetine Hcl]     SOB, palpitations, nausea  . Nsaids     REACTION: abd pain  .  Tegretol [Carbamazepine]     Made her ill    Past Medical History:  Diagnosis Date  . Arthritis   . Depression   . Fibromyalgia   . H/O [facility] spotted fever   . Mitral valve prolapse   . Palpitations   . Scoliosis     Past Surgical History:  Procedure Laterality Date  . APPENDECTOMY  1980  . CESAREAN SECTION  1995  . CHOLECYSTECTOMY  1997    Family History  Problem Relation Age of Onset  . Arrhythmia Paternal Uncle     Social History   Social History  . Marital status: Married    Spouse name: N/A  . Number of children: 2  . Years of education: N/A   Occupational History  . UNEMPLOYED Unemployed   Social History Main Topics  . Smoking status: Never Smoker  . Smokeless tobacco: Never Used  . Alcohol use No  . Drug use: No  . Sexual activity: Yes   Other Topics Concern  . Not on file   Social History Narrative   Mother is Sherrie Mustache.   The PMH, PSH, Social History, Family History, Medications, and allergies have been reviewed in [facility], and have been updated if relevant.   Review of Systems  Musculoskeletal: Positive for myalgias.  Neurological: Negative.   Psychiatric/Behavioral: Negative for agitation, behavioral problems,  confusion, decreased concentration, dysphoric mood and hallucinations. The patient is nervous/anxious. The patient is not hyperactive.   All other systems reviewed and are negative.       Objective:    BP 132/88   Pulse (!) 108   Temp 98.6 F (37 C)   Ht 5\' 4"  (1.626 m)   Wt 184 lb (83.5 kg)   SpO2 97%   BMI 31.58 kg/m    Physical Exam  Constitutional: She is oriented to person, place, and time. She appears well-developed and well-nourished. No distress.  HENT:  Head: Normocephalic.  Eyes: Conjunctivae are normal.  Cardiovascular: Normal rate.   Pulmonary/Chest: Effort normal.  Musculoskeletal: Normal range of motion.  Neurological: She is alert and oriented to person, place, and time. No cranial nerve  deficit.  Skin: Skin is warm.  Psychiatric: She has a normal mood and affect. Her behavior is normal. Judgment and thought content normal.  Nursing note and vitals reviewed.         Assessment & Plan:   Fibromyalgia  GAD (generalized anxiety disorder) No Follow-up on file.

## 2017-02-20 NOTE — Assessment & Plan Note (Signed)
Tramadol Rx refilled and given to pt. She has tried and failed other treatment options.

## 2017-02-20 NOTE — Assessment & Plan Note (Signed)
Xanax refilled today. ?

## 2017-02-21 ENCOUNTER — Telehealth: Payer: Self-pay

## 2017-02-21 NOTE — Telephone Encounter (Signed)
pts husband (DPR signed) left v/m that ondansetron was not in the bag picked up yesterday from [facility]. [facility] said ondansetron and estradiol was picked up 02/20/17. I called the # left in v/m but no answer so I tried another # and got pt. Explained to pt what Tammy had said and pt said it was not in the bag and I advised her to call [facility] [place]; pt said what should she do and again I [facility] and speak with [facility]. Pt began to cry and laid phone down and then her husband picked up phone and I gave him the same info and he said he would call [facility] [place].

## 2017-03-02 NOTE — Telephone Encounter (Signed)
Lmov for patient to call back and schedule appointment ° ° °

## 2017-03-13 ENCOUNTER — Other Ambulatory Visit: Payer: Self-pay

## 2017-03-13 NOTE — Telephone Encounter (Signed)
Patient needs a follow up prior to diltiazem refill.

## 2017-03-19 NOTE — Telephone Encounter (Signed)
Lmov for patient to call back and schedule appointment ° ° °

## 2017-03-21 ENCOUNTER — Telehealth: Payer: Self-pay

## 2017-03-21 NOTE — Telephone Encounter (Signed)
-----   Message from Charlynn Grimes sent at 03/20/2017  4:25 PM EDT ----- Pt called back to let us know she did not want to schedule appt anymore with [place] ----- Message ----- From: Festus Aloe, CMA Sent: 03/13/2017   9:58 AM To: Charlynn Grimes  Patient needs a follow up with Dr. Alvino Chapel or who ever you choose since Ingal leaving.  Thanks Jasmine December

## 2017-04-05 ENCOUNTER — Other Ambulatory Visit: Payer: Self-pay | Admitting: Cardiology

## 2017-04-05 ENCOUNTER — Other Ambulatory Visit: Payer: Self-pay | Admitting: Family Medicine

## 2017-04-05 ENCOUNTER — Encounter: Payer: Self-pay | Admitting: Family Medicine

## 2017-04-05 MED ORDER — METOPROLOL TARTRATE 25 MG PO TABS: 25.0000 mg | ORAL_TABLET | Freq: Two times a day (BID) | ORAL | 0 refills | Status: DC

## 2017-04-05 MED ORDER — DILTIAZEM HCL 30 MG PO TABS: 30.0000 mg | ORAL_TABLET | Freq: Four times a day (QID) | ORAL | 0 refills | Status: DC

## 2017-04-05 NOTE — Telephone Encounter (Signed)
Last office visit 02/20/2017.  Last refilled 02/20/2017.  Ok to refill?

## 2017-04-06 NOTE — Telephone Encounter (Signed)
Alprazolam and Tramadol [id number] [place],  - [address] Phone: [phone]

## 2017-04-10 ENCOUNTER — Other Ambulatory Visit: Payer: Self-pay

## 2017-04-10 MED ORDER — DILTIAZEM HCL 30 MG PO TABS: 30.0000 mg | ORAL_TABLET | Freq: Four times a day (QID) | ORAL | 0 refills | Status: DC

## 2017-04-24 ENCOUNTER — Other Ambulatory Visit: Payer: Self-pay | Admitting: Family Medicine

## 2017-04-27 ENCOUNTER — Emergency Department
Admission: EM | Admit: 2017-04-27 | Discharge: 2017-04-27 | Disposition: A | Discharge status: Home / Self Care | Payer: BLUE CROSS/BLUE SHIELD | Attending: Emergency Medicine | Admitting: Emergency Medicine

## 2017-04-27 ENCOUNTER — Emergency Department: Payer: BLUE CROSS/BLUE SHIELD

## 2017-04-27 ENCOUNTER — Encounter: Payer: Self-pay | Admitting: Emergency Medicine

## 2017-04-27 DIAGNOSIS — Z79899 Other long term (current) drug therapy: Secondary | ICD-10-CM | POA: Insufficient documentation

## 2017-04-27 DIAGNOSIS — R110 Nausea: Secondary | ICD-10-CM | POA: Diagnosis not present

## 2017-04-27 DIAGNOSIS — R1012 Left upper quadrant pain: Secondary | ICD-10-CM | POA: Insufficient documentation

## 2017-04-27 DIAGNOSIS — R109 Unspecified abdominal pain: Secondary | ICD-10-CM

## 2017-04-27 LAB — COMPREHENSIVE METABOLIC PANEL
ALT: 16 U/L (ref 14–54)
ANION GAP: 7 (ref 5–15)
AST: 24 U/L (ref 15–41)
Albumin: 4.1 g/dL (ref 3.5–5.0)
Alkaline Phosphatase: 66 U/L (ref 38–126)
BILIRUBIN TOTAL: 0.8 mg/dL (ref 0.3–1.2)
BUN: 10 mg/dL (ref 6–20)
CALCIUM: 9.1 mg/dL (ref 8.9–10.3)
CO2: 25 mmol/L (ref 22–32)
Chloride: 105 mmol/L (ref 101–111)
Creatinine, Ser: 0.86 mg/dL (ref 0.44–1.00)
Glucose, Bld: 95 mg/dL (ref 65–99)
POTASSIUM: 3.6 mmol/L (ref 3.5–5.1)
Sodium: 137 mmol/L (ref 135–145)
TOTAL PROTEIN: 7.4 g/dL (ref 6.5–8.1)

## 2017-04-27 LAB — URINALYSIS, COMPLETE (UACMP) WITH MICROSCOPIC
BACTERIA UA: NONE SEEN
BILIRUBIN URINE: NEGATIVE
GLUCOSE, UA: NEGATIVE mg/dL
HGB URINE DIPSTICK: NEGATIVE
Ketones, ur: NEGATIVE mg/dL
LEUKOCYTES UA: NEGATIVE
NITRITE: NEGATIVE
Protein, ur: NEGATIVE mg/dL
SPECIFIC GRAVITY, URINE: 1.004 — AB (ref 1.005–1.030)
WBC, UA: NONE SEEN WBC/hpf (ref 0–5)
pH: 7 (ref 5.0–8.0)

## 2017-04-27 LAB — CBC
HCT: 39.7 % (ref 35.0–47.0)
HEMOGLOBIN: 13.9 g/dL (ref 12.0–16.0)
MCH: 31.1 pg (ref 26.0–34.0)
MCHC: 34.9 g/dL (ref 32.0–36.0)
MCV: 89.2 fL (ref 80.0–100.0)
Platelets: 244 10*3/uL (ref 150–440)
RBC: 4.46 MIL/uL (ref 3.80–5.20)
RDW: 13.3 % (ref 11.5–14.5)
WBC: 6.6 10*3/uL (ref 3.6–11.0)

## 2017-04-27 LAB — TROPONIN I

## 2017-04-27 LAB — LIPASE, BLOOD: Lipase: 38 U/L (ref 11–51)

## 2017-04-27 LAB — PREGNANCY, URINE: Preg Test, Ur: NEGATIVE

## 2017-04-27 MED ORDER — HALOPERIDOL LACTATE 5 MG/ML IJ SOLN
2.5000 mg | Freq: Once | INTRAMUSCULAR | Status: AC
  Administered 2017-04-27: 2.5 mg via INTRAVENOUS
  Filled 2017-04-27: qty 1

## 2017-04-27 MED ORDER — PROMETHAZINE HCL 25 MG/ML IJ SOLN
12.5000 mg | Freq: Once | INTRAMUSCULAR | Status: AC
  Administered 2017-04-27: 12.5 mg via INTRAVENOUS
  Filled 2017-04-27: qty 1

## 2017-04-27 MED ORDER — FENTANYL CITRATE (PF) 100 MCG/2ML IJ SOLN
50.0000 ug | Freq: Once | INTRAMUSCULAR | Status: AC
  Administered 2017-04-27: 50 ug via INTRAVENOUS
  Filled 2017-04-27: qty 2

## 2017-04-27 MED ORDER — SODIUM CHLORIDE 0.9 % IV BOLUS (SEPSIS)
500.0000 mL | Freq: Once | INTRAVENOUS | Status: AC
  Administered 2017-04-27: 500 mL via INTRAVENOUS

## 2017-04-27 NOTE — ED Provider Notes (Addendum)
[facility] Emergency Department Provider Note  ____________________________________________   I have reviewed the triage vital signs and the nursing notes.   HISTORY  Chief Complaint Abdominal Pain    HPI Margaret Wood is a 56 y.o. female With a history of depression, anxiety, fibromyalgia, who has had her gallbladder taken out her appendix taken out presents with epigastric abdominal pain and nausea. Began this morning. She is very concerned about it. She had tingling in multiple extremities at the same time. She denies any chest pain, she states the pain in her epigastric region "went everywhere". She states she has Phenergan at home for "episodic nausea" she states that this is somewhat similar to those prior events. She denies a melena bright red blood per rectum or hematemesis. Patient states that sometimes she gets abdominal pain but this seems worse. It's in the same area of her abdomen as it usually is. The patient states that she has excellent results with Zofran but he gives her a "wicked headache" so she wants AN> Patient did take Phenergan at home which she has for her episodic  episodes like this. Pt states pain is sharp, nothing makes it better nothing makes it worse, it is sometimes significant to her. She states also she is under stress because her husband lost his job 2 months ago. However, she has no si or hi.      Past Medical History:  Diagnosis Date  . Arthritis   . Depression   . Fibromyalgia   . H/O [facility] spotted fever   . Mitral valve prolapse   . Palpitations   . Scoliosis     Patient Active Problem List   Diagnosis Date Noted  . Nausea with vomiting 10/11/2015  . Intractable nausea and vomiting 09/30/2015  . Tachycardia 09/30/2015  . Postmenopausal HRT (hormone replacement therapy) 05/03/2015  . MDD (major depressive disorder), recurrent severe, without psychosis (HCC) 07/24/2013  . GAD (generalized anxiety disorder)  07/24/2013  . Suicidal ideation 07/24/2013  . External hemorrhoid, bleeding 01/22/2013  . Symptoms, such as flushing, sleeplessness, headache, lack of concentration, associated with the menopause 05/29/2012  . Fibromyalgia   . Scoliosis   . SCOLIOSIS, LUMBAR SPINE 10/07/2010  . UNSPECIFIED ARTHROPATHY MULTIPLE SITES 04/27/2008    Past Surgical History:  Procedure Laterality Date  . APPENDECTOMY  1980  . CESAREAN SECTION  1995  . CHOLECYSTECTOMY  1997    Prior to Admission medications   Medication Sig Start Date End Date Taking? Authorizing Provider  ALPRAZolam Prudy Feeler) 0.25 MG tablet TAKE 1 TABLET BY MOUTH TWICE A DAY 04/05/17  Yes Dianne Dun, MD  cyclobenzaprine (FLEXERIL) 10 MG tablet TAKE 1 TABLET BY MOUTH AT BEDTIME 06/26/16  Yes Dianne Dun, MD  dicyclomine (BENTYL) 10 MG capsule Take 10 mg by mouth every 6 (six) hours as needed.    Yes [provider]  estradiol (VIVELLE-DOT) 0.05 MG/24HR patch PLACE 1 PATCH (0.05 MG TOTAL) ONTO THE SKIN 2 (TWO) TIMES A WEEK. 11/16/16  Yes Dianne Dun, MD  metoprolol tartrate (LOPRESSOR) 25 MG tablet Take 1 tablet (25 mg total) by mouth 2 (two) times daily. 04/05/17  Yes Dianne Dun, MD  pantoprazole (PROTONIX) 40 MG tablet TAKE 1 TABLET (40 MG TOTAL) BY MOUTH DAILY AS NEEDED. FOR GERD. 04/24/17  Yes Dianne Dun, MD  progesterone (PROMETRIUM) 100 MG capsule TAKE 1 CAPSULE DAILY 12/01/16  Yes Dianne Dun, MD  promethazine (PHENERGAN) 12.5 MG tablet Take 12.5  mg by mouth every 6 (six) hours as needed for nausea or vomiting.   Yes [provider]  traMADol (ULTRAM) 50 MG tablet TAKE 1 TABLET BY MOUTH EVERY 8 HOURS 04/05/17  Yes Dianne DunAron, Talia M, MD  diltiazem (CARDIZEM) 30 MG tablet Take 1 tablet (30 mg total) by mouth 4 (four) times daily. Patient not taking: Reported on 04/27/2017 04/10/17   Dianne DunAron, Talia M, MD  hydrocortisone (ANUSOL-HC) 25 MG suppository UNWRAP AND INSERT 1 SUPPOSITORY (25 MG TOTAL) RECTALLY 2 (TWO) TIMES  DAILY. Patient not taking: Reported on 04/27/2017 05/10/16   Dianne DunAron, Talia M, MD  ondansetron (ZOFRAN) 4 MG tablet TAKE 1 TABLET (4 MG TOTAL) BY MOUTH EVERY 8 (EIGHT) HOURS AS NEEDED FOR NAUSEA OR VOMITING. Patient not taking: Reported on 04/27/2017 04/05/17   Dianne DunAron, Talia M, MD    Allergies Amoxicillin; Cymbalta [duloxetine hcl]; Nsaids; Other; and Tegretol [carbamazepine]  Family History  Problem Relation Age of Onset  . Arrhythmia Paternal Uncle     Social History Social History  Substance Use Topics  . Smoking status: Never Smoker  . Smokeless tobacco: Never Used  . Alcohol use No    Review of Systems Constitutional: No fever/chills Eyes: No visual changes. ENT: No sore throat. No stiff neck no neck pain Cardiovascular: Denies chest pain. Respiratory: Denies shortness of breath. Gastrointestinal:   + nausea and scant clear vomiting.  No diarrhea.  No constipation. Genitourinary: Negative for dysuria. Musculoskeletal: Negative lower extremity swelling Skin: Negative for rash. Neurological: Negative for severe headaches, focal weakness or numbness.   ____________________________________________   PHYSICAL EXAM:  VITAL SIGNS: ED Triage Vitals  Enc Vitals Group     BP 04/27/17 0858 (!) 149/81     Pulse Rate 04/27/17 0858 97     Resp 04/27/17 0858 20     Temp 04/27/17 0858 97.9 F (36.6 C)     Temp Source 04/27/17 0858 Oral     SpO2 04/27/17 0858 99 %     Weight 04/27/17 0848 185 lb (83.9 kg)     Height 04/27/17 0848 5\' 4"  (1.626 m)     Head Circumference --      Peak Flow --      Pain Score 04/27/17 0848 5     Pain Loc --      Pain Edu? --      Excl. in GC? --     Constitutional: Alert and oriented. Well appearing and in no acute distress medically, however she is anxious and upset Eyes: Conjunctivae are normal Head: Atraumatic HEENT: No congestion/rhinnorhea. Mucous membranes are moist.  Oropharynx non-erythematous Neck:   Nontender with no meningismus, no  masses, no stridor Cardiovascular: Normal rate, regular rhythm. Grossly normal heart sounds.  Good peripheral circulation. Respiratory: Normal respiratory effort.  No retractions. Lungs CTAB. Abdominal: Soft and + epigastric abdominal pain. . No distention. No guarding no rebound Back:  There is no focal tenderness or step off.  there is no midline tenderness there are no lesions noted. there is no CVA tenderness Musculoskeletal: No lower extremity tenderness, no upper extremity tenderness. No joint effusions, no DVT signs strong distal pulses no edema Neurologic:  Normal speech and language. No gross focal neurologic deficits are appreciated.  Skin:  Skin is warm, dry and intact. No rash noted. Psychiatric: Mood and affect are anxious and upset. Speech and behavior are normal.  ____________________________________________   LABS (all labs ordered are listed, but only abnormal results are displayed)  Labs Reviewed  URINALYSIS,  COMPLETE (UACMP) WITH MICROSCOPIC - Abnormal; Notable for the following:       Result Value   Color, Urine STRAW (*)    APPearance CLEAR (*)    Specific Gravity, Urine 1.004 (*)    Squamous Epithelial / LPF 0-5 (*)    All other components within normal limits  LIPASE, BLOOD  COMPREHENSIVE METABOLIC PANEL  CBC  TROPONIN I  PREGNANCY, URINE   ____________________________________________  EKG  I personally interpreted any EKGs ordered by me or triage Nsr rate 85 no ste no std no acute ischemic changes` ____________________________________________  RADIOLOGY  I reviewed any imaging ordered by me or triage that were performed during my shift and, if possible, patient and/or family made aware of any abnormal findings. ____________________________________________   PROCEDURES  Procedure(s) performed: None  Procedures  Critical Care performed: None  ____________________________________________   INITIAL IMPRESSION / ASSESSMENT AND PLAN / ED  COURSE  Pertinent labs & imaging results that were available during my care of the patient were reviewed by me and considered in my medical decision making (see chart for details).  Patient here with epigastric reducible abdominal pain, don't think this represents ACS PE dissection or AAA, her old work and vital signs are very reassuring here. We'll treat her symptomatically. Low suspicion for obstruction although I will get an obstruction film. I'm not certain she requires CT scan. It is my hope and get her feeling better. Clearly also there is some element of anxiety to this with tingling in all extremities etc. I don't think that represents acute CVA etc. Patient is calming down here, is my hope that she will feel better soon and we will do serial abdominal exams that have required we will do a CT scan although given her findings blood work and history and exam I think at this time that likely will not be necessary.  ----------------------------------------- 11:53 AM on 04/27/2017 -----------------------------------------  Patient felt much better briefly and now is tearful and states that she is nauseated again. Serial abdominal exams which are no evidence of surgical pathology workup is completely reassuring. Patient does state this is the same symptoms she usually gets when she gets anxious or stressed, and has been to multiple different hospitals here and in [place] for it. She states that if she could just get her nausea feeling better she would feel better however, Zofran gives her headaches and she is not sure she is ready to break Zofran at this time. Haldol has an excellent antiemetics profile, we will try 2.5 of Haldol to see if that helps her. Otherwise, as expected I'm very reassured by all of her findings and her exam    ____________________________________________   FINAL CLINICAL IMPRESSION(S) / ED DIAGNOSES  Final diagnoses:  None      This chart was dictated using voice  recognition software.  Despite best efforts to proofread,  errors can occur which can change meaning.      Jeanmarie Plant, MD 04/27/17 1012    Jeanmarie Plant, MD 04/27/17 1155

## 2017-04-27 NOTE — ED Notes (Signed)
Gave patient ginger ale per Dr. Alphonzo Lemmingsmcshane

## 2017-04-27 NOTE — Discharge Instructions (Signed)
Return to the emergency room for any new or worrisome symptoms including increased pain, vomiting, fever, bleeding, black stool or if you feel worse in any way. Follow closely with primary care doctor tomorrow as well as GI medicine.

## 2017-04-27 NOTE — ED Triage Notes (Signed)
Pt c/o epigastric/LUQ pain that started this AM. Has pins/needles feeling in chest.  No vomiting but nauseated. Denies SHOB. Respirations unlabored.  Skin warm and dry.

## 2017-05-13 ENCOUNTER — Other Ambulatory Visit: Payer: Self-pay | Admitting: Family Medicine

## 2017-05-14 NOTE — Telephone Encounter (Signed)
Ok to refill as requested 

## 2017-05-14 NOTE — Telephone Encounter (Signed)
[facility] faxed to [facility] in [place].

## 2017-05-14 NOTE — Telephone Encounter (Signed)
Pt is requesting refill on alprazolam 0.25mg , tramadol 50mg , Anusol-HC.  Last OV: 02/20/2017 Last Fill on alprazolam: #60 and 0RF Last Fill on tramadol: #90 and 0RF Last Fill on Anusol-HC: #12 and 0RF UDS: None seen  Please advise.

## 2017-05-26 ENCOUNTER — Other Ambulatory Visit: Payer: Self-pay | Admitting: Family Medicine

## 2017-06-21 ENCOUNTER — Other Ambulatory Visit: Payer: Self-pay | Admitting: Family Medicine

## 2017-06-22 NOTE — Telephone Encounter (Signed)
Call[facility] in to [id number] [place], [place] - [address] [phone]  

## 2017-06-22 NOTE — Telephone Encounter (Signed)
Last refill xanax 05/14/17 #60  Tramadol 05/14/17 Last OV 02/20/17  Ok to refill?

## 2017-07-14 IMAGING — DX DG CHEST 2V
2 series · 2 of 2 positions shown · non-contrast
Comparison: 04/13/2010

CLINICAL DATA: Increased heart rate, shortness of breath,
LEFT-sided chest pressure radiating to LEFT side of jaw for 2 weeks,
chest and head congestion for 1 week, palpitations, fibromyalgia

EXAM:
CHEST  2 VIEW

[chest pa]
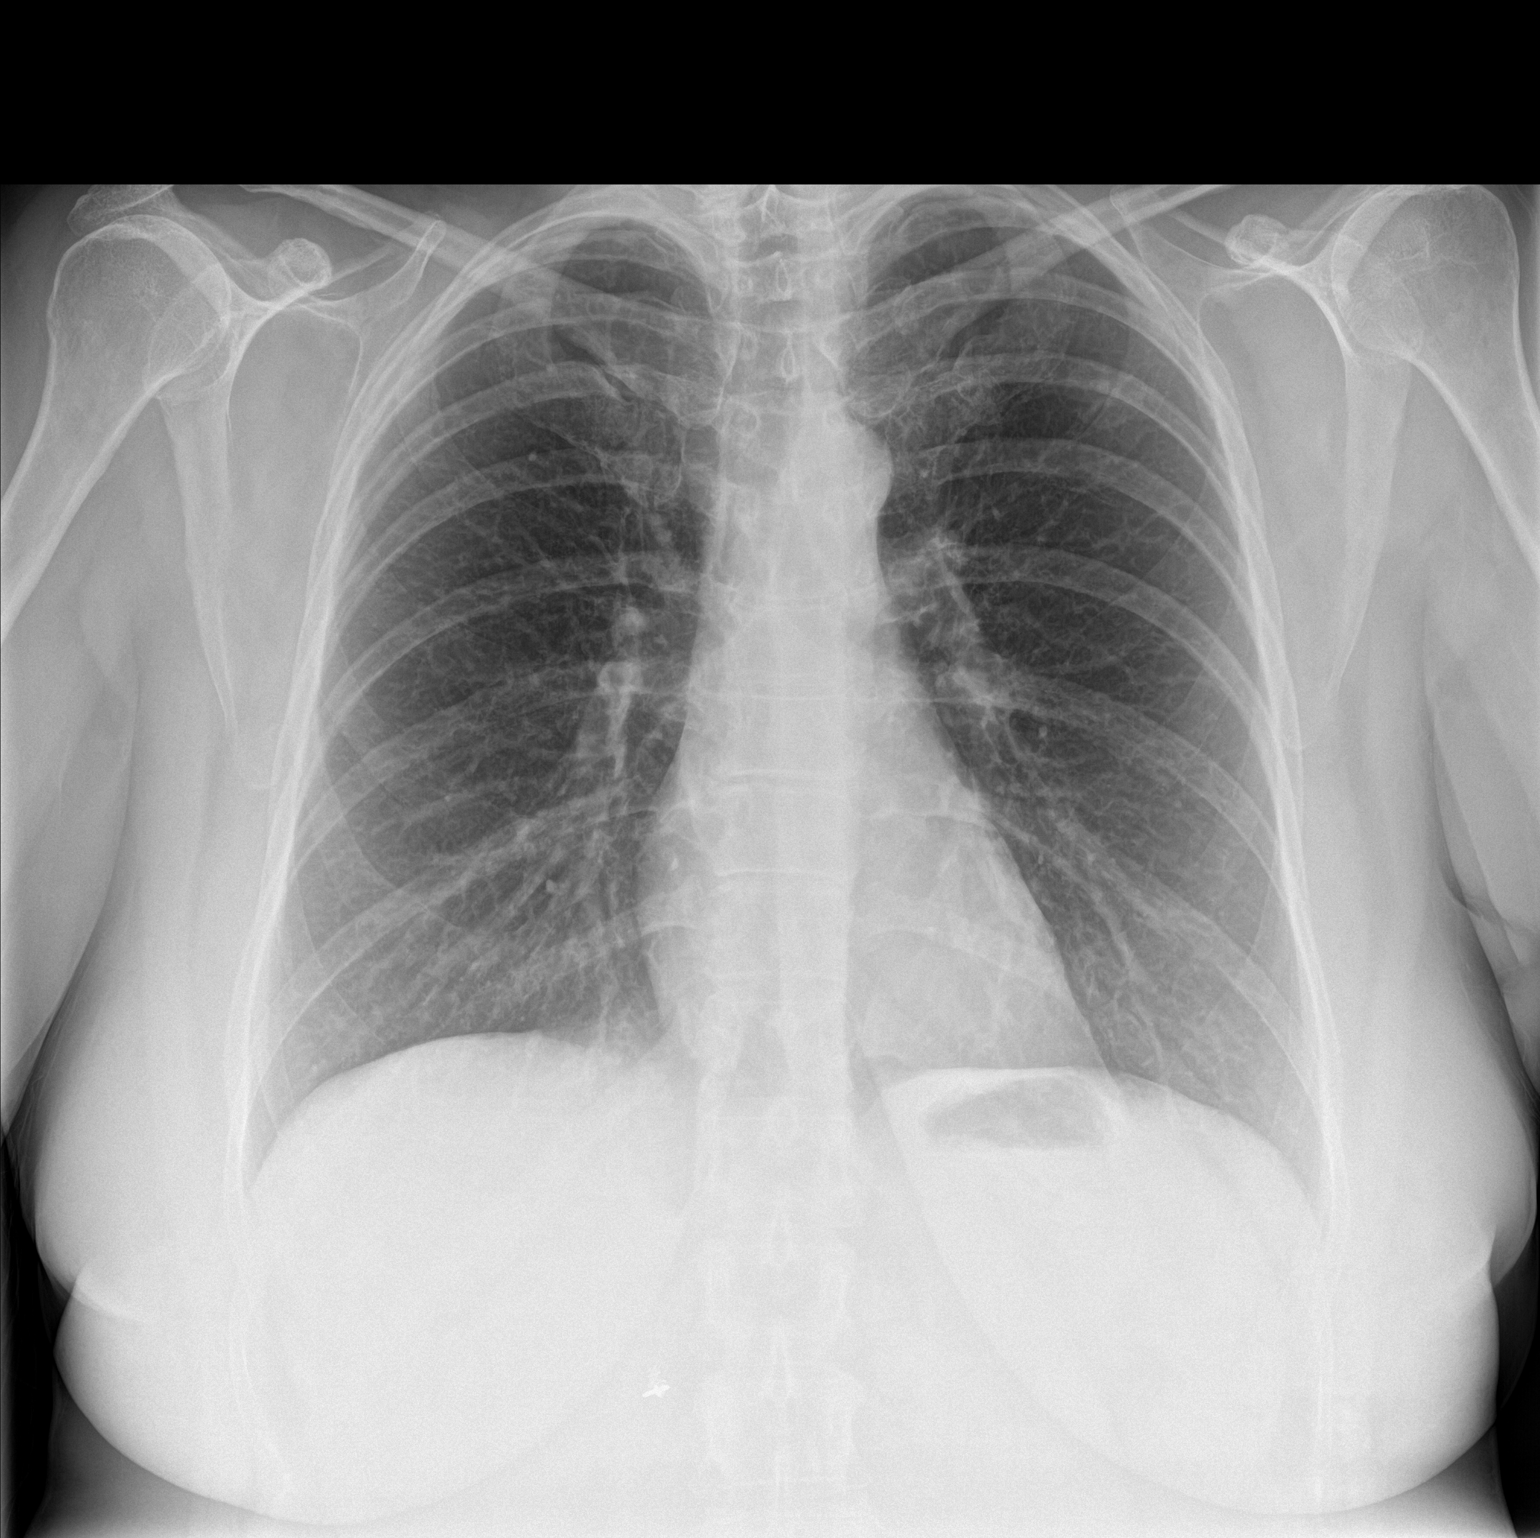

[chest lat]
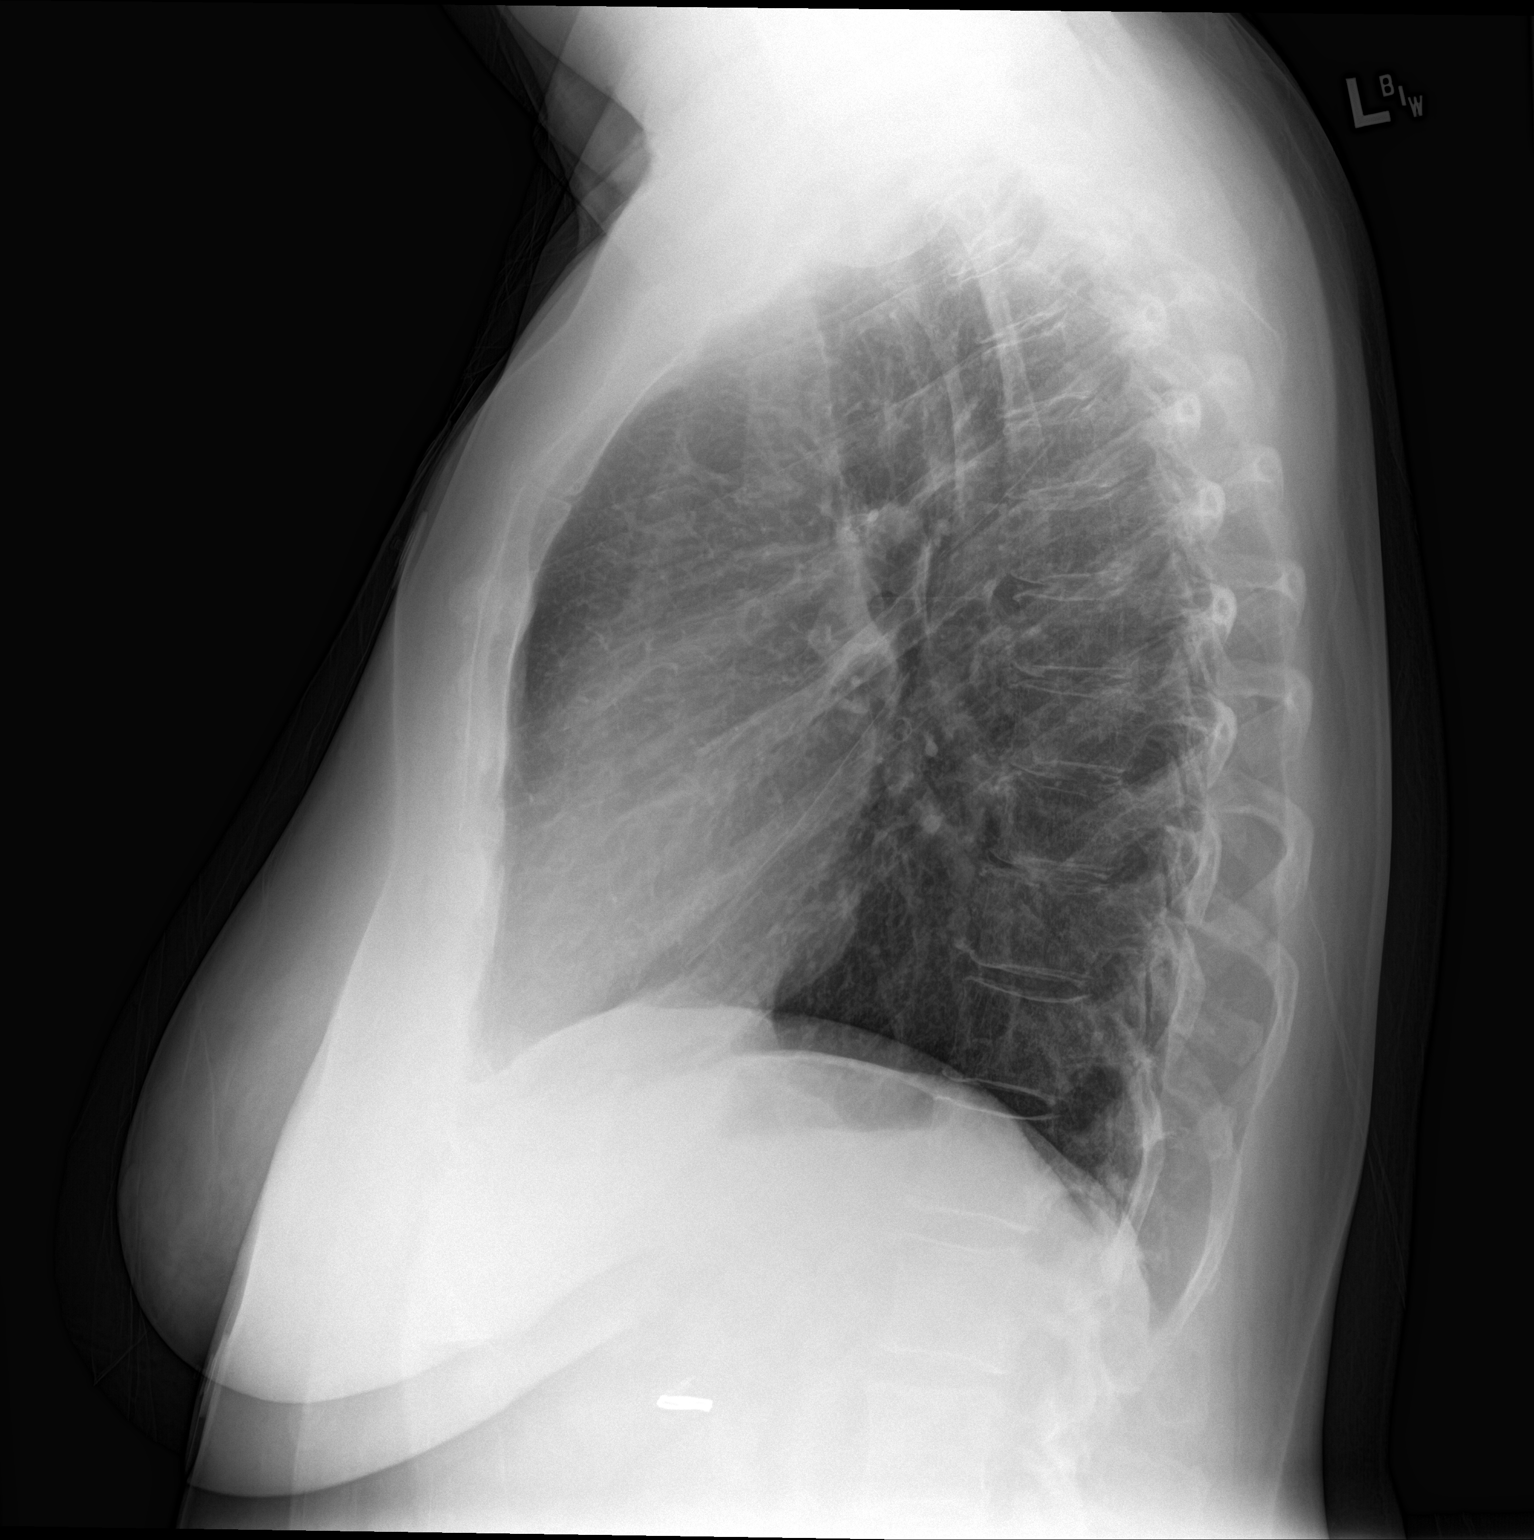

[2 of 2 positions shown; findings below may reference images not displayed]

FINDINGS: Normal heart size, mediastinal contours, and pulmonary vascularity.

Lungs clear.

No pleural effusion or pneumothorax.

Bones slightly demineralized.
IMPRESSION: No acute abnormalities.

## 2017-07-22 ENCOUNTER — Other Ambulatory Visit: Payer: Self-pay | Admitting: Family Medicine

## 2017-07-23 ENCOUNTER — Other Ambulatory Visit: Payer: Self-pay | Admitting: *Deleted

## 2017-07-23 ENCOUNTER — Other Ambulatory Visit: Payer: Self-pay | Admitting: Family Medicine

## 2017-07-23 MED ORDER — METOPROLOL TARTRATE 25 MG PO TABS: 25.0000 mg | ORAL_TABLET | Freq: Two times a day (BID) | ORAL | 0 refills | Status: DC

## 2017-07-23 MED ORDER — CYCLOBENZAPRINE HCL 10 MG PO TABS: 10.0000 mg | ORAL_TABLET | Freq: Every day | ORAL | 0 refills | Status: DC

## 2017-07-23 MED ORDER — ALPRAZOLAM 0.25 MG PO TABS: 0.2500 mg | ORAL_TABLET | Freq: Two times a day (BID) | ORAL | 0 refills | Status: DC

## 2017-07-23 MED ORDER — TRAMADOL HCL 50 MG PO TABS: 50.0000 mg | ORAL_TABLET | Freq: Three times a day (TID) | ORAL | 0 refills | Status: DC

## 2017-07-23 NOTE — Telephone Encounter (Signed)
??   Ok to refill along with other previous request

## 2017-07-23 NOTE — Telephone Encounter (Signed)
Ok for refill? 

## 2017-07-23 NOTE — Telephone Encounter (Signed)
Ok to refill 

## 2017-07-24 ENCOUNTER — Other Ambulatory Visit: Payer: Self-pay | Admitting: Family Medicine

## 2017-09-19 ENCOUNTER — Other Ambulatory Visit: Payer: Self-pay | Admitting: Family Medicine

## 2017-09-20 MED ORDER — TRAMADOL HCL 50 MG PO TABS: 50.0000 mg | ORAL_TABLET | Freq: Three times a day (TID) | ORAL | 0 refills | Status: DC

## 2017-09-20 MED ORDER — ALPRAZOLAM 0.25 MG PO TABS: 0.2500 mg | ORAL_TABLET | Freq: Two times a day (BID) | ORAL | 0 refills | Status: DC

## 2017-09-20 NOTE — Telephone Encounter (Signed)
Requesting: Alprazolam & Tramadol Contract: None UDS: None Last OV: 3.27.2018 Next OV: Not scheduled Last Refill: 8.27.2018   Please advise

## 2017-09-20 NOTE — Telephone Encounter (Signed)
LMOVM that Rx's have been faxed to pharm/thx dmf

## 2017-10-12 ENCOUNTER — Ambulatory Visit (INDEPENDENT_AMBULATORY_CARE_PROVIDER_SITE_OTHER): Payer: 59 | Admitting: Family Medicine

## 2017-10-12 ENCOUNTER — Other Ambulatory Visit: Payer: Self-pay

## 2017-10-12 ENCOUNTER — Encounter: Payer: Self-pay | Admitting: Family Medicine

## 2017-10-12 VITALS — BP 110/62 | HR 86 | Temp 98.3°F | Ht 64.0 in | Wt 192.5 lb

## 2017-10-12 DIAGNOSIS — G8929 Other chronic pain: Secondary | ICD-10-CM

## 2017-10-12 DIAGNOSIS — K1370 Unspecified lesions of oral mucosa: Secondary | ICD-10-CM

## 2017-10-12 DIAGNOSIS — M25512 Pain in left shoulder: Secondary | ICD-10-CM

## 2017-10-12 MED ORDER — TRIAMCINOLONE ACETONIDE 0.1 % MT PSTE: 1.0000 "application " | PASTE | Freq: Two times a day (BID) | OROMUCOSAL | 0 refills | Status: DC

## 2017-10-12 MED ORDER — PROGESTERONE MICRONIZED 100 MG PO CAPS: 100.0000 mg | ORAL_CAPSULE | Freq: Every day | ORAL | 1 refills | Status: DC

## 2017-10-12 MED ORDER — ESTRADIOL 0.05 MG/24HR TD PTTW: 1.0000 | MEDICATED_PATCH | TRANSDERMAL | 3 refills | Status: DC

## 2017-10-12 MED ORDER — MELOXICAM 7.5 MG PO TABS: 7.5000 mg | ORAL_TABLET | Freq: Every day | ORAL | 0 refills | Status: DC | PRN

## 2017-10-12 NOTE — Progress Notes (Signed)
   Subjective:    Patient ID: Margaret Wood, female    DOB: Sep 09, 1961, 56 y.o.   MRN: [id number]  HPI This is a 56 yo female, accompanied by her husband, who presents today with swelling under tongue for a couple of days, feels firm, like a pea under her tongue. Has had recent runny nose, cold symptoms.   Has had left should pain x 6 months. Started after she moved and was lifting boxes, worse with reaching back. Pain at night with sleeping on it. Has been taking ibuprofen 200 mg occasionally and tramadol. Minimal relief. Occasional numbness of hand. Not getting any worse, not getting any better.   She requests refill of hormone replacement meds today.    Past Medical History:  Diagnosis Date  . Arthritis   . Depression   . Fibromyalgia   . [facility]   . Mitral valve prolapse   . Palpitations   . Scoliosis    Past Surgical History:  Procedure Laterality Date  . APPENDECTOMY  1980  . CESAREAN SECTION  1995  . CHOLECYSTECTOMY  1997   Family History  Problem Relation Age of Onset  . Arrhythmia Paternal Uncle    Social History   Tobacco Use  . Smoking status: Never Smoker  . Smokeless tobacco: Never Used  Substance Use Topics  . Alcohol use: No  . Drug use: No      Review of Systems Per HPI    Objective:   Physical Exam  Constitutional: She appears well-developed and well-nourished. No distress.  HENT:  Head: Normocephalic and atraumatic.  Frenulum under tongue with 3 mm, firm, raised, white area. No drainage.   Eyes: Conjunctivae are normal.  Neck: Normal range of motion. Neck supple.  Cardiovascular: Normal rate.  Pulmonary/Chest: Effort normal.  Musculoskeletal:       Left shoulder: She exhibits decreased range of motion (mildly with extension. ) and tenderness (over subacrominal area.). She exhibits no bony tenderness and normal strength.  Lymphadenopathy:    She has no cervical adenopathy.  Skin: She is not diaphoretic.       BP 110/62   Pulse 86   Temp 98.3 F (36.8 C) (Oral)   Ht 5\' 4"  (1.626 m)   Wt 192 lb 8 oz (87.3 kg)   BMI 33.04 kg/m  Wt Readings from Last 3 Encounters:  10/12/17 192 lb 8 oz (87.3 kg)  04/27/17 185 lb (83.9 kg)  02/20/17 184 lb (83.5 kg)       Assessment & Plan:  1. Chronic left shoulder pain - Provided written and verbal information regarding diagnosis and treatment including gentle shoulder exercises -We will try conservative treatment since she has been under dosed with NSAIDs -If no improvement in a couple of weeks I recommended she schedule an appointment with Dr. Patsy Lageropland (sports medicine) for possible injection - meloxicam (MOBIC) 7.5 MG tablet; Take 1 tablet (7.5 mg total) daily as needed by mouth for pain.  Dispense: 30 tablet; Refill: 0  2. Oral lesion -I suspect this is related to her recent upper respiratory virus, will try topical treatment and comfort measures -If no improvement refer to ENT - triamcinolone (KENALOG) 0.1 % paste; Use as directed 1 application 2 (two) times daily in the mouth or throat. For maximum 7 days  Dispense: 5 g; Refill: 0   Olean Reeeborah Gessner, FNP-BC  [facility]  10/12/2017 5:30 PM

## 2017-10-12 NOTE — Patient Instructions (Addendum)
If not better in 2-3 weeks, please schedule an appointment with Dr. Patsy Lageropland Please take meloxicam every day for 5-7 days then daily as needed If mouth lesion not better with medication, please schedule follow up- try warm, salt water swishes  Shoulder Range of Motion Exercises Shoulder range of motion (ROM) exercises are designed to keep the shoulder moving freely. They are often recommended for people who have shoulder pain. Phase 1 exercises When you are able, do this exercise 5-6 days per week, or as told by your health care provider. Work toward doing 2 sets of 10 swings. Pendulum Exercise How To Do This Exercise Lying Down 1. Lie face-down on a bed with your abdomen close to the side of the bed. 2. Let your arm hang over the side of the bed. 3. Relax your shoulder, arm, and hand. 4. Slowly and gently swing your arm forward and back. Do not use your neck muscles to swing your arm. They should be relaxed. If you are struggling to swing your arm, have someone gently swing it for you. When you do this exercise for the first time, swing your arm at a 15 degree angle for 15 seconds, or swing your arm 10 times. As pain lessens over time, increase the angle of the swing to 30-45 degrees. 5. Repeat steps 1-4 with the other arm.  How To Do This Exercise While Standing 1. Stand next to a sturdy chair or table and hold on to it with your hand. 1. Bend forward at the waist. 2. Bend your knees slightly. 3. Relax your other arm and let it hang limp. 4. Relax the shoulder blade of the arm that is hanging and let it drop. 5. While keeping your shoulder relaxed, use body motion to swing your arm in small circles. The first time you do this exercise, swing your arm for about 30 seconds or 10 times. When you do it next time, swing your arm for a little longer. 6. Stand up tall and relax. 7. Repeat steps 1-7, this time changing the direction of the circles. 2. Repeat steps 1-8 with the other arm.  Phase 2  exercises Do these exercises 3-4 times per day on 5-6 days per week or as told by your health care provider. Work toward holding the stretch for 20 seconds. Stretching Exercise 1 1. Lift your arm straight out in front of you. 2. Bend your arm 90 degrees at the elbow (right angle) so your forearm goes across your body and looks like the letter "L." 3. Use your other arm to gently pull the elbow forward and across your body. 4. Repeat steps 1-3 with the other arm. Stretching Exercise 2 You will need a towel or rope for this exercise. 1. Bend one arm behind your back with the palm facing outward. 2. Hold a towel with your other hand. 3. Reach the arm that holds the towel above your head, and bend that arm at the elbow. Your wrist should be behind your neck. 4. Use your free hand to grab the free end of the towel. 5. With the higher hand, gently pull the towel up behind you. 6. With the lower hand, pull the towel down behind you. 7. Repeat steps 1-6 with the other arm.  Phase 3 exercises Do each of these exercises at four different times of day (sessions) every day or as told by your health care provider. To begin with, repeat each exercise 5 times (repetitions). Work toward doing 3 sets of  12 repetitions or as told by your health care provider. Strengthening Exercise 1 You will need a light weight for this activity. As you grow stronger, you may use a heavier weight. 1. Standing with a weight in your hand, lift your arm straight out to the side until it is at the same height as your shoulder. 2. Bend your arm at 90 degrees so that your fingers are pointing to the ceiling. 3. Slowly raise your hand until your arm is straight up in the air. 4. Repeat steps 1-3 with the other arm.  Strengthening Exercise 2 You will need a light weight for this activity. As you grow stronger, you may use a heavier weight. 1. Standing with a weight in your hand, gradually move your straight arm in an arc,  starting at your side, then out in front of you, then straight up over your head. 2. Gradually move your other arm in an arc, starting at your side, then out in front of you, then straight up over your head. 3. Repeat steps 1-2 with the other arm.  Strengthening Exercise 3 You will need an elastic band for this activity. As you grow stronger, gradually increase the size of the bands or increase the number of bands that you use at one time. 1. While standing, hold an elastic band in one hand and raise that arm up in the air. 2. With your other hand, pull down the band until that hand is by your side. 3. Repeat steps 1-2 with the other arm.  This information is not intended to replace advice given to you by your health care provider. Make sure you discuss any questions you have with your health care provider. Document Released: 08/12/2003 Document Revised: 07/09/2016 Document Reviewed: 11/09/2014 Elsevier Interactive Patient Education  [employer]2018 [employer]

## 2017-10-13 ENCOUNTER — Encounter (HOSPITAL_BASED_OUTPATIENT_CLINIC_OR_DEPARTMENT_OTHER): Payer: Self-pay | Admitting: Emergency Medicine

## 2017-10-13 ENCOUNTER — Other Ambulatory Visit: Payer: Self-pay

## 2017-10-13 ENCOUNTER — Emergency Department (HOSPITAL_BASED_OUTPATIENT_CLINIC_OR_DEPARTMENT_OTHER)
Admission: EM | Admit: 2017-10-13 | Discharge: 2017-10-13 | Disposition: A | Discharge status: Home / Self Care | Payer: 59 | Attending: Emergency Medicine | Admitting: Emergency Medicine

## 2017-10-13 DIAGNOSIS — K1379 Other lesions of oral mucosa: Secondary | ICD-10-CM | POA: Diagnosis not present

## 2017-10-13 DIAGNOSIS — Z79899 Other long term (current) drug therapy: Secondary | ICD-10-CM | POA: Insufficient documentation

## 2017-10-13 DIAGNOSIS — K115 Sialolithiasis: Secondary | ICD-10-CM | POA: Diagnosis not present

## 2017-10-13 MED ORDER — HYDROCODONE-ACETAMINOPHEN 5-325 MG PO TABS: 2.0000 | ORAL_TABLET | ORAL | 0 refills | Status: DC | PRN

## 2017-10-13 MED ORDER — BENZOCAINE 20 % MT AERO
INHALATION_SPRAY | Freq: Once | OROMUCOSAL | Status: AC
  Administered 2017-10-13: 1 via OROMUCOSAL
  Filled 2017-10-13: qty 57

## 2017-10-13 MED ORDER — IBUPROFEN 600 MG PO TABS: 600.0000 mg | ORAL_TABLET | Freq: Four times a day (QID) | ORAL | 0 refills | Status: DC | PRN

## 2017-10-13 MED ORDER — CLINDAMYCIN HCL 150 MG PO CAPS: 150.0000 mg | ORAL_CAPSULE | Freq: Four times a day (QID) | ORAL | 0 refills | Status: DC

## 2017-10-13 NOTE — ED Provider Notes (Signed)
[facility] EMERGENCY DEPARTMENT Provider Note   CSN: [id number] Arrival date & time: 10/13/17  1541     History   Chief Complaint Chief Complaint  Patient presents with  . Mouth Lesions    HPI Margaret Wood is a 56 y.o. female.  HPI Started getting painful swelling under her left floor of the mouth three days ago.  She was seen by her PCP the following day and given meloxicam and a topical steroid paste.  Reports it has gotten worse and more painful.  She perceives more swelling under the chin. Past Medical History:  Diagnosis Date  . Arthritis   . Depression   . Fibromyalgia   . [facility]   . Mitral valve prolapse   . Palpitations   . Scoliosis     Patient Active Problem List   Diagnosis Date Noted  . Nausea with vomiting 10/11/2015  . Intractable nausea and vomiting 09/30/2015  . Tachycardia 09/30/2015  . Postmenopausal HRT (hormone replacement therapy) 05/03/2015  . MDD (major depressive disorder), recurrent severe, without psychosis (HCC) 07/24/2013  . GAD (generalized anxiety disorder) 07/24/2013  . Suicidal ideation 07/24/2013  . External hemorrhoid, bleeding 01/22/2013  . Symptoms, such as flushing, sleeplessness, headache, lack of concentration, associated with the menopause 05/29/2012  . Fibromyalgia   . Scoliosis   . SCOLIOSIS, LUMBAR SPINE 10/07/2010  . UNSPECIFIED ARTHROPATHY MULTIPLE SITES 04/27/2008    Past Surgical History:  Procedure Laterality Date  . APPENDECTOMY  1980  . CESAREAN SECTION  1995  . CHOLECYSTECTOMY  1997    OB History    Gravida Para Term Preterm AB Living   1         2   SAB TAB Ectopic Multiple Live Births                   Home Medications    Prior to Admission medications   Medication Sig Start Date End Date Taking? Authorizing Provider  ALPRAZolam (XANAX) 0.25 MG tablet Take 1 tablet (0.25 mg total) by mouth 2 (two) times daily. 09/20/17   Dianne DunAron, Talia M, MD  clindamycin  (CLEOCIN) 150 MG capsule Take 1 capsule (150 mg total) 4 (four) times daily by mouth. X 7 days 10/13/17   Arby BarrettePfeiffer, Nyssa Sayegh, MD  cyclobenzaprine (FLEXERIL) 10 MG tablet Take 1 tablet (10 mg total) by mouth at bedtime. 07/23/17   Dianne DunAron, Talia M, MD  cyclobenzaprine (FLEXERIL) 10 MG tablet Take 1 tablet (10 mg total) by mouth at bedtime. 07/23/17   Dianne DunAron, Talia M, MD  dicyclomine (BENTYL) 10 MG capsule Take 10 mg by mouth every 6 (six) hours as needed.     [provider]  diltiazem (CARDIZEM) 30 MG tablet Take 1 tablet (30 mg total) by mouth 4 (four) times daily. Patient not taking: Reported on 04/27/2017 04/10/17   Dianne DunAron, Talia M, MD  estradiol (VIVELLE-DOT) 0.05 MG/24HR patch Place 1 patch (0.05 mg total) 2 (two) times a week onto the skin. 10/15/17   Emi BelfastGessner, Deborah B, FNP  HYDROcodone-acetaminophen (NORCO/VICODIN) 5-325 MG tablet Take 2 tablets every 4 (four) hours as needed by mouth. 10/13/17   Arby BarrettePfeiffer, Bookert Guzzi, MD  hydrocortisone (ANUSOL-HC) 25 MG suppository UNWRAP AND INSERT 1 SUPPOSITORY (25 MG TOTAL) RECTALLY 2 (TWO) TIMES DAILY. 05/14/17   Dianne DunAron, Talia M, MD  ibuprofen (ADVIL,MOTRIN) 600 MG tablet Take 1 tablet (600 mg total) every 6 (six) hours as needed by mouth. 10/13/17   Arby BarrettePfeiffer, Milo Schreier, MD  meloxicam (  MOBIC) 7.5 MG tablet Take 1 tablet (7.5 mg total) daily as needed by mouth for pain. 10/12/17   Emi BelfastGessner, Deborah B, FNP  metoprolol tartrate (LOPRESSOR) 25 MG tablet Take 1 tablet (25 mg total) by mouth 2 (two) times daily. 07/23/17   Dianne DunAron, Talia M, MD  ondansetron (ZOFRAN) 4 MG tablet TAKE 1 TABLET (4 MG TOTAL) BY MOUTH EVERY 8 (EIGHT) HOURS AS NEEDED FOR NAUSEA OR VOMITING. Patient not taking: Reported on 04/27/2017 04/05/17   Dianne DunAron, Talia M, MD  pantoprazole (PROTONIX) 40 MG tablet TAKE 1 TABLET (40 MG TOTAL) BY MOUTH DAILY AS NEEDED. FOR GERD. 07/24/17   Dianne DunAron, Talia M, MD  progesterone (PROMETRIUM) 100 MG capsule Take 1 capsule (100 mg total) daily by mouth. 10/12/17   Emi BelfastGessner, Deborah B, FNP    promethazine (PHENERGAN) 12.5 MG tablet Take 12.5 mg by mouth every 6 (six) hours as needed for nausea or vomiting.    [provider]  traMADol (ULTRAM) 50 MG tablet Take 1 tablet (50 mg total) by mouth every 8 (eight) hours. 09/20/17   Dianne DunAron, Talia M, MD  triamcinolone (KENALOG) 0.1 % paste Use as directed 1 application 2 (two) times daily in the mouth or throat. For maximum 7 days 10/12/17   Emi BelfastGessner, Deborah B, FNP    Family History Family History  Problem Relation Age of Onset  . Arrhythmia Paternal Uncle     Social History Social History   Tobacco Use  . Smoking status: Never Smoker  . Smokeless tobacco: Never Used  Substance Use Topics  . Alcohol use: No  . Drug use: No     Allergies   Amoxicillin; Cymbalta [duloxetine hcl]; Other; and Tegretol [carbamazepine]   Review of Systems Review of Systems 10 Systems reviewed and are negative for acute change except as noted in the HPI.   Physical Exam Updated Vital Signs BP 127/83 (BP Location: Right Arm)   Pulse 87   Temp 98.1 F (36.7 C) (Oral)   Resp 16   Ht 5\' 4"  (1.626 m)   Wt 86.2 kg (190 lb)   SpO2 100%   BMI 32.61 kg/m   Physical Exam  Constitutional: She is oriented to person, place, and time. She appears well-developed and well-nourished. No distress.  HENT:  Head: Normocephalic and atraumatic.  Lateral TMs normal.  Normal external ear canals.  No significant facial swelling to general inspection of the face or the neck.  Intraorally the left submandibular duct has a visible, partially protruding calcification.  Posterior oropharynx widely patent.   Eyes: Conjunctivae are normal.  Neck: Neck supple.  Cardiovascular: Normal rate and regular rhythm.  No murmur heard. Pulmonary/Chest: Effort normal and breath sounds normal. No respiratory distress.  Musculoskeletal: Normal range of motion.  Neurological: She is alert and oriented to person, place, and time. No cranial nerve deficit. She exhibits  normal muscle tone. Coordination normal.  Skin: Skin is warm and dry.  Psychiatric: She has a normal mood and affect.  Nursing note and vitals reviewed.    ED Treatments / Results  Labs (all labs ordered are listed, but only abnormal results are displayed) Labs Reviewed - No data to display  EKG  EKG Interpretation None       Radiology No results found.  Procedures Procedures (including critical care time) I applied Hurricaine spray to the floor of the mouth and attempted to milk out left salivary duct stone.  Stone would not extrude.  I also took a forceps and was able  to just remove a small fragment of stone that is visible.  Still would not extrude with compression.  No bleeding or other injury.  No attempted dilating or manipulating the duct itself. Medications Ordered in ED Medications  Benzocaine (HURRCAINE) 20 % mouth spray (1 application Mouth/Throat Given 10/13/17 1726)     Initial Impression / Assessment and Plan / ED Course  I have reviewed the triage vital signs and the nursing notes.  Pertinent labs & imaging results that were available during my care of the patient were reviewed by me and considered in my medical decision making (see chart for details).      Final Clinical Impressions(s) / ED Diagnoses   Final diagnoses:  Sialolithiasis, ductal   Patient has evident salivary duct stone that is visible and palpable at the submandibular left duct.  She does not have significant associated facial swelling or erythema but does have facial discomfort that radiates to the left ear.  Instructions provided for management of salivary duct stones.  She is advised on follow-up with ENT. ED Discharge Orders        Ordered    clindamycin (CLEOCIN) 150 MG capsule  4 times daily     10/13/17 1748    HYDROcodone-acetaminophen (NORCO/VICODIN) 5-325 MG tablet  Every 4 hours PRN     10/13/17 1748    ibuprofen (ADVIL,MOTRIN) 600 MG tablet  Every 6 hours PRN     10/13/17  1748       Arby Barrette, MD 10/13/17 1756

## 2017-10-13 NOTE — ED Triage Notes (Signed)
Pt c/o sore under tongue since yesterday; no injury

## 2017-10-13 NOTE — ED Notes (Signed)
ED Provider at bedside. 

## 2017-10-14 ENCOUNTER — Encounter: Payer: Self-pay | Admitting: Family Medicine

## 2017-10-15 ENCOUNTER — Encounter: Payer: Self-pay | Admitting: Family Medicine

## 2017-10-17 ENCOUNTER — Other Ambulatory Visit: Payer: Self-pay | Admitting: Family Medicine

## 2017-10-17 MED ORDER — HYDROCODONE-ACETAMINOPHEN 5-325 MG PO TABS: 2.0000 | ORAL_TABLET | Freq: Two times a day (BID) | ORAL | 0 refills | Status: DC | PRN

## 2017-10-20 ENCOUNTER — Other Ambulatory Visit: Payer: Self-pay | Admitting: Family Medicine

## 2017-10-22 DIAGNOSIS — K1120 Sialoadenitis, unspecified: Secondary | ICD-10-CM | POA: Diagnosis not present

## 2017-11-03 ENCOUNTER — Encounter: Payer: Self-pay | Admitting: Family Medicine

## 2017-11-08 ENCOUNTER — Other Ambulatory Visit: Payer: Self-pay | Admitting: Family Medicine

## 2017-11-08 ENCOUNTER — Encounter: Payer: Self-pay | Admitting: Family Medicine

## 2017-11-08 DIAGNOSIS — M25512 Pain in left shoulder: Principal | ICD-10-CM

## 2017-11-08 DIAGNOSIS — G8929 Other chronic pain: Secondary | ICD-10-CM

## 2017-11-09 ENCOUNTER — Other Ambulatory Visit: Payer: Self-pay

## 2017-11-09 MED ORDER — TRAMADOL HCL 50 MG PO TABS: 50.0000 mg | ORAL_TABLET | Freq: Three times a day (TID) | ORAL | 0 refills | Status: DC

## 2017-11-09 MED ORDER — PROGESTERONE MICRONIZED 100 MG PO CAPS: 100.0000 mg | ORAL_CAPSULE | Freq: Every day | ORAL | 0 refills | Status: DC

## 2017-11-09 MED ORDER — METOPROLOL TARTRATE 25 MG PO TABS: 25.0000 mg | ORAL_TABLET | Freq: Two times a day (BID) | ORAL | 0 refills | Status: DC

## 2017-11-09 MED ORDER — ESTRADIOL 0.05 MG/24HR TD PTTW: 1.0000 | MEDICATED_PATCH | TRANSDERMAL | 0 refills | Status: DC

## 2017-11-09 MED ORDER — ALPRAZOLAM 0.25 MG PO TABS: 0.2500 mg | ORAL_TABLET | Freq: Two times a day (BID) | ORAL | 0 refills | Status: DC

## 2017-11-09 NOTE — Progress Notes (Signed)
Rx's sent in and Tramadol & Alprazolam called in to pharm/per TA/thx dmf

## 2017-11-12 ENCOUNTER — Encounter: Payer: Self-pay | Admitting: Family Medicine

## 2017-11-12 ENCOUNTER — Ambulatory Visit (INDEPENDENT_AMBULATORY_CARE_PROVIDER_SITE_OTHER)
Admission: RE | Admit: 2017-11-12 | Discharge: 2017-11-12 | Disposition: A | Discharge status: Home / Self Care | Payer: 59 | Source: Ambulatory Visit | Attending: Family Medicine | Admitting: Family Medicine

## 2017-11-12 ENCOUNTER — Ambulatory Visit (INDEPENDENT_AMBULATORY_CARE_PROVIDER_SITE_OTHER): Payer: 59 | Admitting: Family Medicine

## 2017-11-12 VITALS — BP 120/76 | HR 108 | Temp 98.0°F | Ht 64.0 in | Wt 190.0 lb

## 2017-11-12 DIAGNOSIS — M5412 Radiculopathy, cervical region: Secondary | ICD-10-CM

## 2017-11-12 DIAGNOSIS — R29898 Other symptoms and signs involving the musculoskeletal system: Secondary | ICD-10-CM | POA: Diagnosis not present

## 2017-11-12 DIAGNOSIS — M25512 Pain in left shoulder: Secondary | ICD-10-CM

## 2017-11-12 DIAGNOSIS — M19012 Primary osteoarthritis, left shoulder: Secondary | ICD-10-CM | POA: Diagnosis not present

## 2017-11-12 MED ORDER — TRAMADOL HCL 50 MG PO TABS: 50.0000 mg | ORAL_TABLET | Freq: Three times a day (TID) | ORAL | 3 refills | Status: DC

## 2017-11-12 MED ORDER — ESTRADIOL 0.05 MG/24HR TD PTTW: 1.0000 | MEDICATED_PATCH | TRANSDERMAL | 1 refills | Status: DC

## 2017-11-12 MED ORDER — ALPRAZOLAM 0.25 MG PO TABS: 0.2500 mg | ORAL_TABLET | Freq: Two times a day (BID) | ORAL | 2 refills | Status: DC

## 2017-11-12 MED ORDER — METOPROLOL TARTRATE 25 MG PO TABS: 25.0000 mg | ORAL_TABLET | Freq: Two times a day (BID) | ORAL | 1 refills | Status: DC

## 2017-11-12 MED ORDER — PROGESTERONE MICRONIZED 100 MG PO CAPS: 100.0000 mg | ORAL_CAPSULE | Freq: Every day | ORAL | 1 refills | Status: DC

## 2017-11-12 NOTE — Progress Notes (Signed)
Dr. Karleen HampshireSpencer T. Zaylyn Bergdoll, MD, CAQ Sports Medicine Primary [facility] and Sports Medicine [address] [place], [address] Phone: [phone] Fax: [phone]  11/12/2017  Patient: Margaret Wood, MRN: [id number], DOB: Dec 11, 1960, 56 y.o.  Primary Physician:  Dianne DunAron, Talia M, MD   Chief Complaint  Patient presents with  . Shoulder Pain    left off and on for several years   Subjective:   Margaret Wood is a 56 y.o. very pleasant female patient who presents with the following:  Patient is a pleasant 56 year old who has had pain with her left shoulder off and on for multiple years.  6 months ago this got dramatically worse when she was moving, and she is not entirely clear about what she did, but at this point she is having severe pain with movement of her shoulder including abduction, flexion, as well as reaching across the body.  She has never had any surgical intervention on her neck or in her shoulder.   Moved about six months ago and it has been sore every since then. Hurts at night like a toothache. Pain in her and some radiation. Has had some numbness and in the arm and has had some ibuprofen.   The radicular type symptoms are present for many years, and they are not currently active for at least rare.  They are not provokable.  She does have weakness throughout her shoulder complex.  On the left.  Past Medical History, Surgical History, Social History, Family History, Problem List, Medications, and Allergies have been reviewed and updated if relevant.  Patient Active Problem List   Diagnosis Date Noted  . Nausea with vomiting 10/11/2015  . Intractable nausea and vomiting 09/30/2015  . Tachycardia 09/30/2015  . Postmenopausal HRT (hormone replacement therapy) 05/03/2015  . MDD (major depressive disorder), recurrent severe, without psychosis (HCC) 07/24/2013  . GAD (generalized anxiety disorder) 07/24/2013  . Suicidal ideation 07/24/2013  . External hemorrhoid,  bleeding 01/22/2013  . Symptoms, such as flushing, sleeplessness, headache, lack of concentration, associated with the menopause 05/29/2012  . Fibromyalgia   . Scoliosis   . SCOLIOSIS, LUMBAR SPINE 10/07/2010  . UNSPECIFIED ARTHROPATHY MULTIPLE SITES 04/27/2008    Past Medical History:  Diagnosis Date  . Arthritis   . Depression   . Fibromyalgia   . [facility]   . Mitral valve prolapse   . Palpitations   . Scoliosis     Past Surgical History:  Procedure Laterality Date  . APPENDECTOMY  1980  . CESAREAN SECTION  1995  . CHOLECYSTECTOMY  1997    Social History   Socioeconomic History  . Marital status: Married    Spouse name: Not on file  . Number of children: 2  . Years of education: Not on file  . Highest education level: Not on file  Social Needs  . Financial resource strain: Not on file  . Food insecurity - worry: Not on file  . Food insecurity - inability: Not on file  . Transportation needs - medical: Not on file  . Transportation needs - non-medical: Not on file  Occupational History  . Occupation: UNEMPLOYED    Employer: UNEMPLOYED  Tobacco Use  . Smoking status: Never Smoker  . Smokeless tobacco: Never Used  Substance and Sexual Activity  . Alcohol use: No  . Drug use: No  . Sexual activity: Yes  Other Topics Concern  . Not on file  Social History Narrative   Mother is Dondra SpryGail  Russell.    Family History  Problem Relation Age of Onset  . Arrhythmia Paternal Uncle     Allergies  Allergen Reactions  . Amoxicillin Nausea Only  . Cymbalta [Duloxetine Hcl]     SOB, palpitations, nausea  . Other     Cannot take anything extended release per pt  . Tegretol [Carbamazepine]     Made her ill    Medication list reviewed and updated in full in [facility].  GEN: No fevers, chills. Nontoxic. Primarily MSK c/o today. MSK: Detailed in the HPI GI: tolerating PO intake without difficulty Neuro: No numbness, parasthesias, or  tingling associated. Otherwise the pertinent positives of the ROS are noted above.   Objective:   BP 120/76   Pulse (!) 108   Temp 98 F (36.7 C) (Oral)   Ht 5\' 4"  (1.626 m)   Wt 190 lb (86.2 kg)   BMI 32.61 kg/m    GEN: Well-developed,well-nourished,in no acute distress; alert,appropriate and cooperative throughout examination HEENT: Normocephalic and atraumatic without obvious abnormalities. Ears, externally no deformities PULM: Breathing comfortably in no respiratory distress EXT: No clubbing, cyanosis, or edema PSYCH: Normally interactive. Cooperative during the interview. Pleasant. Friendly and conversant. Not anxious or depressed appearing. Normal, full affect.  CERVICAL SPINE EXAM Range of motion: Flexion, extension, lateral bending, and rotation: full Pain with terminal motion: pain with rotational movements - posterior Spinous Processes: NT SCM: NT Upper paracervical muscles: ttp b diffuse Upper traps: NT C5-T1 intact, sensation and motor   Left shoulder: Nontender along the clavicle.  Minimally tender at the [facility].  Patient does have some mild tenderness in the bicipital groove.  Abduction: Full passive range of motion.  Active range of motion to approximately 90 degrees.  Strength is 2+/5. Flexion: Full passive range of motion: Active range of motion to approximately 120 degrees.  Strength is 3/5. External range of motion: Grossly normal.  Pain limits assessment.  Strength is 4/5. Internal range of motion: Pain limits assessment.  Strength is 4/5.  Crossover test is positive. Speeds test is positive Yergason's test is positive.  Drop test is positive.  Radiology: Dg Shoulder Left  Result Date: 11/12/2017 CLINICAL DATA:  56 year old female with left shoulder pain. No reported injury. Initial encounter. EXAM: LEFT SHOULDER - 2+ VIEW COMPARISON:  None. FINDINGS: Mild acromioclavicular joint degenerative changes. No fracture or dislocation. No abnormal soft  tissue calcifications. Visualized lungs clear. IMPRESSION: Mild acromioclavicular joint degenerative changes. Electronically Signed   By: Lacy DuverneySteven  Olson M.D.   On: 11/12/2017 18:21     Assessment and Plan:   Left shoulder pain, unspecified chronicity - Plan: DG Shoulder Left, MR Shoulder Left Wo Contrast  Cervical radiculopathy, chronic  Shoulder weakness - Plan: MR Shoulder Left Wo Contrast  Markedly abnormal shoulder examination with dramatic weakness and pain with a positive drop test.  Clinical concern for high-grade partial-thickness versus full-thickness rotator cuff tear sustained 6 months prior while moving.  Obtain an MRI of the left shoulder to further evaluate probable internal derangement of the shoulder.  Involved shoulder surgeon based on MRI.  Primary care provider has moved offices.  I refilled the patient's medications until she reestablishes with a new PCP, and she has known Dr. Alphonsus SiasLetvak for many years.  Follow-up: with Dr. Elbert EwingsL for new PCP appointment.  Future Appointments  Date Time Provider Department Center  03/05/2018 11:30 AM Karie SchwalbeLetvak, Richard I, MD [facility]    Meds ordered this encounter  Medications  . estradiol (VIVELLE-DOT)  0.05 MG/24HR patch    Sig: Place 1 patch (0.05 mg total) onto the skin 2 (two) times a week.    Dispense:  90 patch    Refill:  1  . metoprolol tartrate (LOPRESSOR) 25 MG tablet    Sig: Take 1 tablet (25 mg total) by mouth 2 (two) times daily.    Dispense:  180 tablet    Refill:  1  . progesterone (PROMETRIUM) 100 MG capsule    Sig: Take 1 capsule (100 mg total) by mouth daily.    Dispense:  90 capsule    Refill:  1  . traMADol (ULTRAM) 50 MG tablet    Sig: Take 1 tablet (50 mg total) by mouth every 8 (eight) hours.    Dispense:  90 tablet    Refill:  3    Not to exceed 5 additional fills before 12/19/2017  . ALPRAZolam (XANAX) 0.25 MG tablet    Sig: Take 1 tablet (0.25 mg total) by mouth 2 (two) times daily.    Dispense:  60  tablet    Refill:  2    Not to exceed 5 additional fills before 12/19/2017   Medications Discontinued During This Encounter  Medication Reason  . clindamycin (CLEOCIN) 150 MG capsule Completed Course  . cyclobenzaprine (FLEXERIL) 10 MG tablet Duplicate  . triamcinolone (KENALOG) 0.1 % paste Completed Course  . estradiol (VIVELLE-DOT) 0.05 MG/24HR patch Reorder  . metoprolol tartrate (LOPRESSOR) 25 MG tablet Reorder  . progesterone (PROMETRIUM) 100 MG capsule Reorder  . traMADol (ULTRAM) 50 MG tablet Reorder  . ALPRAZolam (XANAX) 0.25 MG tablet Reorder   Orders Placed This Encounter  Procedures  . DG Shoulder Left  . MR Shoulder Left Wo Contrast    Signed,  Sacoya Mcgourty T. Davarius Ridener, MD   Allergies as of 11/12/2017      Reactions   Amoxicillin Nausea Only   Cymbalta [duloxetine Hcl]    SOB, palpitations, nausea   Other    Cannot take anything extended release per pt   Tegretol [carbamazepine]    Made her ill      Medication List        Accurate as of 11/12/17  1:52 PM. Always use your most recent med list.          ALPRAZolam 0.25 MG tablet Commonly known as:  XANAX Take 1 tablet (0.25 mg total) by mouth 2 (two) times daily.   cyclobenzaprine 10 MG tablet Commonly known as:  FLEXERIL Take 1 tablet (10 mg total) by mouth at bedtime.   dicyclomine 10 MG capsule Commonly known as:  BENTYL Take 10 mg by mouth every 6 (six) hours as needed.   diltiazem 30 MG tablet Commonly known as:  CARDIZEM Take 1 tablet (30 mg total) by mouth 4 (four) times daily.   estradiol 0.05 MG/24HR patch Commonly known as:  VIVELLE-DOT Place 1 patch (0.05 mg total) onto the skin 2 (two) times a week.   HYDROcodone-acetaminophen 5-325 MG tablet Commonly known as:  NORCO/VICODIN Take 2 tablets by mouth 2 (two) times daily as needed.   hydrocortisone 25 MG suppository Commonly known as:  ANUSOL-HC UNWRAP AND INSERT 1 SUPPOSITORY (25 MG TOTAL) RECTALLY 2 (TWO) TIMES DAILY.     ibuprofen 600 MG tablet Commonly known as:  ADVIL,MOTRIN Take 1 tablet (600 mg total) every 6 (six) hours as needed by mouth.   meloxicam 7.5 MG tablet Commonly known as:  MOBIC Take 1 tablet (7.5 mg total) daily as needed by mouth  for pain.   metoprolol tartrate 25 MG tablet Commonly known as:  LOPRESSOR Take 1 tablet (25 mg total) by mouth 2 (two) times daily.   ondansetron 4 MG tablet Commonly known as:  ZOFRAN TAKE 1 TABLET (4 MG TOTAL) BY MOUTH EVERY 8 (EIGHT) HOURS AS NEEDED FOR NAUSEA OR VOMITING.   pantoprazole 40 MG tablet Commonly known as:  PROTONIX TAKE 1 TABLET (40 MG TOTAL) BY MOUTH DAILY AS NEEDED. FOR GERD.   progesterone 100 MG capsule Commonly known as:  PROMETRIUM Take 1 capsule (100 mg total) by mouth daily.   promethazine 12.5 MG tablet Commonly known as:  PHENERGAN Take 12.5 mg by mouth every 6 (six) hours as needed for nausea or vomiting.   traMADol 50 MG tablet Commonly known as:  ULTRAM Take 1 tablet (50 mg total) by mouth every 8 (eight) hours.

## 2017-11-12 NOTE — Patient Instructions (Signed)

## 2017-11-13 ENCOUNTER — Other Ambulatory Visit: Payer: Self-pay | Admitting: *Deleted

## 2017-11-13 ENCOUNTER — Telehealth: Payer: Self-pay | Admitting: *Deleted

## 2017-11-13 NOTE — Telephone Encounter (Signed)
[facility] stating Tramadol is only covered for 7 days without PA completed for chronic pain condition.  PA completed on CoverMyMeds.  It was sent for review.  Can take up to 72 hours for a decision.

## 2017-11-14 NOTE — Telephone Encounter (Signed)
Received fax from [facility].  It states Tramadol is on plan's list of covered drugs.  Prior authorization is not required at this time.  [facility] notified via fax.

## 2017-11-15 ENCOUNTER — Telehealth: Payer: Self-pay | Admitting: Family Medicine

## 2017-11-15 ENCOUNTER — Telehealth: Payer: Self-pay | Admitting: Internal Medicine

## 2017-11-15 NOTE — Telephone Encounter (Signed)
Bea with [facility] left v/m; the [place] Stop Act will only allow 7 day for Tramadol. Pt received 7 day supply or # 21 . If pt needs more than 7 day supply pt will need PA done due to [place] Stop Act and Ins guidelines. If pt needs more than 7 day supply PA of a qualifying chronic pain condition will be needed. [facility] questions [facility]

## 2017-11-15 NOTE — Telephone Encounter (Signed)
Copied from CRM [phone]#[id number]. Topic: General - Other >> Nov 15, 2017  4:47 PM Raquel SarnaHayes, Teresa G wrote: Pt's husband called to let a doctor know or to have this placed on her chart regarding her MRI  scheduled for Fri. 12-21 tomorrow has been cancelled for monetary reasons until after the new year. For Pt's Tramadol refills, they can be filled only for 7 days.  Insurance needs a diagnosis of long term pain treatment to provide the reg amount of Rx per refill. Please call pt back to let him know when the Diagnosis of Long Term Pain Treatment has been sent to their insurance company. Pt is scheduled for NP visit to establish care with Dr. Alphonsus SiasLetvak.

## 2017-11-15 NOTE — Telephone Encounter (Signed)
Spoke with Brionna at [facility].  She states with the new opioid law a first fill can only be for 7 days.  Patient will be able to get full 30 day supply on 11/18/2017.  She states that the pharmacy has also done something and had gotten a DUR rejection.  She states the pharmacy will need to call their help desk at [phone] to get override code for that if refill on 11/18/17 is denied.  [facility]

## 2017-11-15 NOTE — Telephone Encounter (Addendum)
This has been addressed.  See telephone note from 11/13/17.  Left message for Margaret Wood that this has been taken care of and she should be able to get her remaining refill on 11/19/2017.

## 2017-11-15 NOTE — Telephone Encounter (Signed)
Copied from CRM [phone]#[id number]. Topic: Inquiry >> Nov 15, 2017  4:37 PM Stephannie LiSimmons, Denajah Farias L, NT wrote: Reason for CRM: Prescription for tramadol needs a diagnosis for refill  to be approved by the insurance company,  was only filled / approved for 7  days until this is done , please call [phone]

## 2017-11-15 NOTE — Telephone Encounter (Signed)
Copied from CRM #[id number]. Topic: Inquiry >> Nov 15, 2017  4:37 PM Nivaan Dicenzo L, NT wrote: Reason for CRM: Prescription for tramadol needs a diagnosis for refill  to be approved by the insurance company,  was only filled / approved for 7  days until this is done , please call [phone]  

## 2017-11-16 ENCOUNTER — Ambulatory Visit: Payer: 59

## 2017-11-16 NOTE — Telephone Encounter (Signed)
I left detailed message on Margaret Wood's phone yesterday that the Tramadol issue has been taken care of and she should be able to get her remaining refill on 11/19/2017.

## 2017-11-16 NOTE — Telephone Encounter (Signed)
It looks like this has been resolved.

## 2017-11-23 ENCOUNTER — Telehealth: Payer: Self-pay | Admitting: Family Medicine

## 2017-11-23 NOTE — Telephone Encounter (Signed)
Copied from CRM [phone]#[id number]. Topic: Referral - Request >> Nov 23, 2017 12:26 PM Waymon AmatoBurton, Donna F wrote: Reason for CRM: pt husband is calling to state that pt is ready to reschedule the mri of her left shoulder  Best number is the husband number [phone]

## 2017-11-23 NOTE — Telephone Encounter (Unsigned)
Copied from CRM [id number]. Topic: Referral - Request >> Nov 23, 2017 12:40 PM Waymon AmatoBurton, Donna F wrote: Reason for CRM: pt husband is calling to let the referral department know that she is ready to have the mri of her left shoulder redone  Best number husband [phone]

## 2017-11-23 NOTE — Telephone Encounter (Signed)
Appt rescheduled to 11/26/17 at 1:30pm. Patient aware.

## 2017-11-26 ENCOUNTER — Ambulatory Visit
Admission: RE | Admit: 2017-11-26 | Discharge: 2017-11-26 | Disposition: A | Discharge status: Home / Self Care | Payer: 59 | Source: Ambulatory Visit | Attending: Family Medicine | Admitting: Family Medicine

## 2017-11-26 DIAGNOSIS — M25512 Pain in left shoulder: Secondary | ICD-10-CM | POA: Insufficient documentation

## 2017-11-26 DIAGNOSIS — R29898 Other symptoms and signs involving the musculoskeletal system: Secondary | ICD-10-CM | POA: Diagnosis not present

## 2017-11-26 DIAGNOSIS — M19012 Primary osteoarthritis, left shoulder: Secondary | ICD-10-CM | POA: Insufficient documentation

## 2017-12-06 ENCOUNTER — Telehealth: Payer: Self-pay

## 2017-12-06 NOTE — Telephone Encounter (Signed)
Received Disability Determination Services forms on 1/9 Placed forms in interoffice mail and CIOX packet to patient Left message for patient with instructions and stated they will be receiving CIOX packet in mail

## 2017-12-10 ENCOUNTER — Ambulatory Visit (INDEPENDENT_AMBULATORY_CARE_PROVIDER_SITE_OTHER): Payer: 59 | Admitting: Family Medicine

## 2017-12-10 ENCOUNTER — Encounter: Payer: Self-pay | Admitting: *Deleted

## 2017-12-10 ENCOUNTER — Other Ambulatory Visit: Payer: Self-pay

## 2017-12-10 ENCOUNTER — Encounter: Payer: Self-pay | Admitting: Family Medicine

## 2017-12-10 VITALS — BP 112/78 | HR 114 | Temp 98.6°F | Ht 64.0 in | Wt 194.8 lb

## 2017-12-10 DIAGNOSIS — M797 Fibromyalgia: Secondary | ICD-10-CM | POA: Diagnosis not present

## 2017-12-10 DIAGNOSIS — M75112 Incomplete rotator cuff tear or rupture of left shoulder, not specified as traumatic: Secondary | ICD-10-CM

## 2017-12-10 DIAGNOSIS — M19019 Primary osteoarthritis, unspecified shoulder: Secondary | ICD-10-CM

## 2017-12-10 MED ORDER — METHYLPREDNISOLONE ACETATE 40 MG/ML IJ SUSP
80.0000 mg | Freq: Once | INTRAMUSCULAR | Status: AC
  Administered 2017-12-10: 80 mg via INTRA_ARTICULAR

## 2017-12-10 NOTE — Patient Instructions (Signed)

## 2017-12-10 NOTE — Progress Notes (Signed)
Dr. Karleen HampshireSpencer T. Deiona Hooper, MD, CAQ Sports Medicine Primary [facility] and Sports Medicine [address] [place], [address] Phone: [phone] Fax: [phone]  12/10/2017  Patient: Margaret FusMichelle A Wood, MRN: [id number], DOB: [birth year], 57 y.o.  Primary Physician:  Dianne DunAron, Talia M, MD   Chief Complaint  Patient presents with  . Follow-up    Left Shoulder   Subjective:   Margaret Wood is a 57 y.o. very pleasant female patient who presents with the following:  Pleasant 57 year old female with fibromyalgia who presents with left-sided shoulder pain and follow-up.  I saw her one month ago, and at that point she was having such significant pain abducting her shoulder and with decreased functionality that I thought she likely had a rotator cuff tear and ultimately got an MRI of her shoulder.  She does have some AC joint arthropathy as well as a small longitudinal tear at the distal supraspinatus, but this is certainly not large.  She continues to have some significant amount of pain, trouble with sleeping, and pain with movement in her shoulder.  L shoulder longitudinal supra tear.  fibro  Subac inj  Past Medical History, Surgical History, Social History, Family History, Problem List, Medications, and Allergies have been reviewed and updated if relevant.  Patient Active Problem List   Diagnosis Date Noted  . Sialadenitis 10/22/2017  . Nausea with vomiting 10/11/2015  . Intractable nausea and vomiting 09/30/2015  . Tachycardia 09/30/2015  . Postmenopausal HRT (hormone replacement therapy) 05/03/2015  . MDD (major depressive disorder), recurrent severe, without psychosis (HCC) 07/24/2013  . GAD (generalized anxiety disorder) 07/24/2013  . Suicidal ideation 07/24/2013  . External hemorrhoid, bleeding 01/22/2013  . Symptoms, such as flushing, sleeplessness, headache, lack of concentration, associated with the menopause 05/29/2012  . Fibromyalgia   . Scoliosis   . SCOLIOSIS, LUMBAR  SPINE 10/07/2010  . UNSPECIFIED ARTHROPATHY MULTIPLE SITES 04/27/2008    Past Medical History:  Diagnosis Date  . Arthritis   . Depression   . Fibromyalgia   . [facility]   . Mitral valve prolapse   . Palpitations   . Scoliosis     Past Surgical History:  Procedure Laterality Date  . APPENDECTOMY  1980  . CESAREAN SECTION  1995  . CHOLECYSTECTOMY  1997    Social History   Socioeconomic History  . Marital status: Married    Spouse name: Not on file  . Number of children: 2  . Years of education: Not on file  . Highest education level: Not on file  Social Needs  . Financial resource strain: Not on file  . Food insecurity - worry: Not on file  . Food insecurity - inability: Not on file  . Transportation needs - medical: Not on file  . Transportation needs - non-medical: Not on file  Occupational History  . Occupation: UNEMPLOYED    Employer: UNEMPLOYED  Tobacco Use  . Smoking status: Never Smoker  . Smokeless tobacco: Never Used  Substance and Sexual Activity  . Alcohol use: No  . Drug use: No  . Sexual activity: Yes  Other Topics Concern  . Not on file  Social History Narrative   Mother is Sherrie MustacheGail Wood.    Family History  Problem Relation Age of Onset  . Arrhythmia Paternal Uncle     Allergies  Allergen Reactions  . Amoxicillin Nausea Only  . Cymbalta [Duloxetine Hcl]     SOB, palpitations, nausea  . Other     Cannot  take anything extended release per pt  . Tegretol [Carbamazepine]     Made her ill    Medication list reviewed and updated in full in [facility].  GEN: No fevers, chills. Nontoxic. Primarily MSK c/o today. MSK: Detailed in the HPI GI: tolerating PO intake without difficulty Neuro: No numbness, parasthesias, or tingling associated. Otherwise the pertinent positives of the ROS are noted above.   Objective:   BP 112/78   Pulse (!) 114   Temp 98.6 F (37 C) (Oral)   Ht 5\' 4"  (1.626 m)   Wt 194 lb 12  oz (88.3 kg)   BMI 33.43 kg/m    GEN: WDWN, NAD, Non-toxic, Alert & Oriented x 3 HEENT: Atraumatic, Normocephalic.  Ears and Nose: No external deformity. EXTR: No clubbing/cyanosis/edema NEURO: Normal gait.  PSYCH: Normally interactive. Conversant. Not depressed or anxious appearing.  Calm demeanor.    Left shoulder: Nontender along the clavicle.  The patient is tender to palpation at the [facility] joint.  Nontender in the bicipital groove.  She is modestly tender in the anterior supraspinatus insertion region distally.  She does have a significant painful arc of motion in abduction.  She also has significant painful flexion.  Strength is 4/5. Internal and external range of motion are full and 5/5.  Crossover is positive. Speeds and Yergason's are negative. Leanord Asal is positive.  Radiology: Mr Shoulder Left Wo Contrast  Result Date: 11/26/2017 CLINICAL DATA:  Left shoulder pain and decreased range of motion for 6 months. EXAM: MRI OF THE LEFT SHOULDER WITHOUT CONTRAST TECHNIQUE: Multiplanar, multisequence MR imaging of the shoulder was performed. No intravenous contrast was administered. COMPARISON:  Radiographs dated 11/12/2017 FINDINGS: Rotator cuff: There is a small linear intrasubstance tear of the distal supraspinatus tendon best seen on image 13 of series 4. Rotator cuff otherwise appears normal. Muscles: No atrophy or abnormal signal of the muscles of the rotator cuff. Biceps long head:  Properly located and intact. Acromioclavicular Joint: Slight AC joint arthropathy. Type 2 acromion. No bursitis. Glenohumeral Joint: Normal. Labrum:  Normal. Bones:  Normal. Other: None IMPRESSION: 1. Mild AC joint arthropathy. 2. Mild degenerative changes of the distal supraspinatus tendon. 3. No other significant findings. Electronically Signed   By: Francene Boyers M.D.   On: 11/26/2017 14:37   Dg Shoulder Left  Result Date: 11/12/2017 CLINICAL DATA:  57 year old female with left shoulder pain.  No reported injury. Initial encounter. EXAM: LEFT SHOULDER - 2+ VIEW COMPARISON:  None. FINDINGS: Mild acromioclavicular joint degenerative changes. No fracture or dislocation. No abnormal soft tissue calcifications. Visualized lungs clear. IMPRESSION: Mild acromioclavicular joint degenerative changes. Electronically Signed   By: Lacy Duverney M.D.   On: 11/12/2017 18:21    Assessment and Plan:   Partial nontraumatic rupture of rotator cuff, left - Plan: Ambulatory referral to Physical Therapy, methylPREDNISolone acetate (DEPO-MEDROL) injection 80 mg  AC joint arthropathy - Plan: Ambulatory referral to Physical Therapy, methylPREDNISolone acetate (DEPO-MEDROL) injection 80 mg  Fibromyalgia - Plan: Ambulatory referral to Physical Therapy  >25 minutes spent in face to face time with patient, >50% spent in counselling or coordination of care: Additional time spent discussing shoulder anatomy with the patient and I reviewed the patient's MRI face-to-face and reviewed coronal, sagittal, and axial views with the patient.  Primary finding is a small distal intrasubstance tear at the distal supraspinatus.  This type of injury usually does quite well nonoperatively.  Patient seems to be having more pain than would be typically expected,  and I wonder if fibromyalgia might be playing a role in her pain response.  I am going to do a subacromial injection for pain management and get her into physical therapy.  SubAC Injection, L Verbal consent was obtained from the patient. Risks (including rare infection), benefits, and alternatives were explained. Patient prepped with Chloraprep and Ethyl Chloride used for anesthesia. The subacromial space was injected using the posterior approach. The patient tolerated the procedure well and had decreased pain post injection. No complications. Injection: 8 cc of Lidocaine 1% and 2 mL of Depo-Medrol 40 mg. Needle: 22 gauge   Follow-up: Return in about 2 months (around  02/07/2018).  Meds ordered this encounter  Medications  . methylPREDNISolone acetate (DEPO-MEDROL) injection 80 mg   Orders Placed This Encounter  Procedures  . Ambulatory referral to Physical Therapy    Signed,  Karleen Hampshire T. Danny Yackley, MD   Allergies as of 12/10/2017      Reactions   Amoxicillin Nausea Only   Cymbalta [duloxetine Hcl]    SOB, palpitations, nausea   Other    Cannot take anything extended release per pt   Tegretol [carbamazepine]    Made her ill      Medication List        Accurate as of 12/10/17 11:59 PM. Always use your most recent med list.          ALPRAZolam 0.25 MG tablet Commonly known as:  XANAX Take 1 tablet (0.25 mg total) by mouth 2 (two) times daily.   cyclobenzaprine 10 MG tablet Commonly known as:  FLEXERIL Take 1 tablet (10 mg total) by mouth at bedtime.   dicyclomine 10 MG capsule Commonly known as:  BENTYL Take 10 mg by mouth every 6 (six) hours as needed.   diltiazem 30 MG tablet Commonly known as:  CARDIZEM Take 1 tablet (30 mg total) by mouth 4 (four) times daily.   estradiol 0.05 MG/24HR patch Commonly known as:  VIVELLE-DOT Place 1 patch (0.05 mg total) onto the skin 2 (two) times a week.   HYDROcodone-acetaminophen 5-325 MG tablet Commonly known as:  NORCO/VICODIN Take 2 tablets by mouth 2 (two) times daily as needed.   hydrocortisone 25 MG suppository Commonly known as:  ANUSOL-HC UNWRAP AND INSERT 1 SUPPOSITORY (25 MG TOTAL) RECTALLY 2 (TWO) TIMES DAILY.   ibuprofen 600 MG tablet Commonly known as:  ADVIL,MOTRIN Take 1 tablet (600 mg total) every 6 (six) hours as needed by mouth.   meloxicam 7.5 MG tablet Commonly known as:  MOBIC Take 1 tablet (7.5 mg total) daily as needed by mouth for pain.   metoprolol tartrate 25 MG tablet Commonly known as:  LOPRESSOR Take 1 tablet (25 mg total) by mouth 2 (two) times daily.   ondansetron 4 MG tablet Commonly known as:  ZOFRAN TAKE 1 TABLET (4 MG TOTAL) BY MOUTH EVERY  8 (EIGHT) HOURS AS NEEDED FOR NAUSEA OR VOMITING.   pantoprazole 40 MG tablet Commonly known as:  PROTONIX TAKE 1 TABLET (40 MG TOTAL) BY MOUTH DAILY AS NEEDED. FOR GERD.   progesterone 100 MG capsule Commonly known as:  PROMETRIUM Take 1 capsule (100 mg total) by mouth daily.   promethazine 12.5 MG tablet Commonly known as:  PHENERGAN Take 12.5 mg by mouth every 6 (six) hours as needed for nausea or vomiting.   traMADol 50 MG tablet Commonly known as:  ULTRAM Take 1 tablet (50 mg total) by mouth every 8 (eight) hours.

## 2018-01-03 ENCOUNTER — Other Ambulatory Visit: Payer: Self-pay

## 2018-01-03 MED ORDER — METOPROLOL TARTRATE 25 MG PO TABS: 25.0000 mg | ORAL_TABLET | Freq: Two times a day (BID) | ORAL | 0 refills | Status: DC

## 2018-01-07 ENCOUNTER — Other Ambulatory Visit: Payer: Self-pay | Admitting: Family Medicine

## 2018-01-16 ENCOUNTER — Other Ambulatory Visit: Payer: Self-pay

## 2018-01-16 ENCOUNTER — Telehealth: Payer: Self-pay | Admitting: Family Medicine

## 2018-01-16 DIAGNOSIS — M25512 Pain in left shoulder: Principal | ICD-10-CM

## 2018-01-16 DIAGNOSIS — G8929 Other chronic pain: Secondary | ICD-10-CM

## 2018-01-16 MED ORDER — ALPRAZOLAM 0.25 MG PO TABS: 0.2500 mg | ORAL_TABLET | Freq: Two times a day (BID) | ORAL | 1 refills | Status: DC

## 2018-01-16 MED ORDER — TRAMADOL HCL 50 MG PO TABS: 50.0000 mg | ORAL_TABLET | Freq: Three times a day (TID) | ORAL | 1 refills | Status: DC

## 2018-01-16 NOTE — Telephone Encounter (Signed)
Copied from CRM #[id number]. Topic: General - Other >> Jan 16, 2018  3:00 PM Lelon FrohlichGolden, Lynanne Delgreco, [place] wrote: Reason for CRM: [facility] [address] [place]

## 2018-01-16 NOTE — Telephone Encounter (Signed)
New has a new patient appt with a new office in 2 months. I will give her a 2 month supply

## 2018-01-16 NOTE — Telephone Encounter (Signed)
Mr Jeanett SchleinFrantz [facility](DPR signed) said that Dr Patsy Lageropland had refilled pts alprazolam (#60 x2 on 11/12/17) and tramadol(#90 x3 on 11/12/17) [facility]. [facility] [place]; Mr Jeanett SchleinFrantz said his insurance will not work with [facility] and [facility] will not [facility] those rx because they are controlled meds that [facility] has never [facility]. Mr Jeanett SchleinFrantz request [facility] [address] ([address]). Please advise. Please call pt when refills done.

## 2018-01-17 MED ORDER — MELOXICAM 7.5 MG PO TABS: 7.5000 mg | ORAL_TABLET | Freq: Every day | ORAL | 0 refills | Status: DC | PRN

## 2018-01-17 MED ORDER — CYCLOBENZAPRINE HCL 10 MG PO TABS: 10.0000 mg | ORAL_TABLET | Freq: Every day | ORAL | 0 refills | Status: DC

## 2018-01-17 MED ORDER — PANTOPRAZOLE SODIUM 40 MG PO TBEC: 40.0000 mg | DELAYED_RELEASE_TABLET | Freq: Every day | ORAL | 0 refills | Status: DC | PRN

## 2018-01-17 MED ORDER — METOPROLOL TARTRATE 25 MG PO TABS: 25.0000 mg | ORAL_TABLET | Freq: Two times a day (BID) | ORAL | 0 refills | Status: DC

## 2018-01-17 MED ORDER — ALPRAZOLAM 0.25 MG PO TABS: 0.2500 mg | ORAL_TABLET | Freq: Two times a day (BID) | ORAL | 1 refills | Status: DC

## 2018-01-17 MED ORDER — DICYCLOMINE HCL 10 MG PO CAPS: 10.0000 mg | ORAL_CAPSULE | Freq: Four times a day (QID) | ORAL | 0 refills | Status: DC | PRN

## 2018-01-17 MED ORDER — DILTIAZEM HCL 30 MG PO TABS: 30.0000 mg | ORAL_TABLET | Freq: Four times a day (QID) | ORAL | 0 refills | Status: DC

## 2018-01-17 NOTE — Telephone Encounter (Signed)
Sent Rx's to pharm/thx dmf

## 2018-02-07 ENCOUNTER — Ambulatory Visit: Payer: Self-pay | Admitting: Family Medicine

## 2018-03-05 ENCOUNTER — Ambulatory Visit: Payer: Self-pay | Admitting: Internal Medicine

## 2018-03-12 ENCOUNTER — Ambulatory Visit (INDEPENDENT_AMBULATORY_CARE_PROVIDER_SITE_OTHER): Payer: 59 | Admitting: Family Medicine

## 2018-03-12 ENCOUNTER — Encounter: Payer: Self-pay | Admitting: Family Medicine

## 2018-03-12 VITALS — BP 104/80 | HR 103 | Temp 98.0°F | Resp 16 | Ht 64.0 in | Wt 194.0 lb

## 2018-03-12 DIAGNOSIS — R000 Tachycardia, unspecified: Secondary | ICD-10-CM

## 2018-03-12 DIAGNOSIS — F411 Generalized anxiety disorder: Secondary | ICD-10-CM

## 2018-03-12 DIAGNOSIS — M797 Fibromyalgia: Secondary | ICD-10-CM | POA: Diagnosis not present

## 2018-03-12 DIAGNOSIS — Z79890 Hormone replacement therapy (postmenopausal): Secondary | ICD-10-CM | POA: Diagnosis not present

## 2018-03-12 DIAGNOSIS — M419 Scoliosis, unspecified: Secondary | ICD-10-CM | POA: Diagnosis not present

## 2018-03-12 DIAGNOSIS — F332 Major depressive disorder, recurrent severe without psychotic features: Secondary | ICD-10-CM

## 2018-03-12 MED ORDER — CYCLOBENZAPRINE HCL 10 MG PO TABS: 10.0000 mg | ORAL_TABLET | Freq: Every day | ORAL | 1 refills | Status: DC

## 2018-03-12 MED ORDER — PANTOPRAZOLE SODIUM 40 MG PO TBEC: 40.0000 mg | DELAYED_RELEASE_TABLET | Freq: Every day | ORAL | 2 refills | Status: DC | PRN

## 2018-03-12 MED ORDER — ESTRADIOL 0.05 MG/24HR TD PTTW: 1.0000 | MEDICATED_PATCH | TRANSDERMAL | 3 refills | Status: DC

## 2018-03-12 MED ORDER — METOPROLOL TARTRATE 25 MG PO TABS: 25.0000 mg | ORAL_TABLET | Freq: Two times a day (BID) | ORAL | 3 refills | Status: DC

## 2018-03-12 MED ORDER — TRAMADOL HCL 50 MG PO TABS: 50.0000 mg | ORAL_TABLET | Freq: Three times a day (TID) | ORAL | 2 refills | Status: DC

## 2018-03-12 MED ORDER — PROGESTERONE MICRONIZED 100 MG PO CAPS: 100.0000 mg | ORAL_CAPSULE | Freq: Every day | ORAL | 3 refills | Status: DC

## 2018-03-12 MED ORDER — PROMETHAZINE HCL 12.5 MG PO TABS: 12.5000 mg | ORAL_TABLET | Freq: Four times a day (QID) | ORAL | 2 refills | Status: DC | PRN

## 2018-03-12 MED ORDER — DICYCLOMINE HCL 10 MG PO CAPS: 10.0000 mg | ORAL_CAPSULE | Freq: Four times a day (QID) | ORAL | 1 refills | Status: DC | PRN

## 2018-03-12 NOTE — Progress Notes (Signed)
Patient: Margaret Wood, Female    DOB: [birth year], 57 y.o.   MRN: [id number] Visit Date: 03/12/2018  Today's Provider: Shirlee Latch, MD   I, Joslyn Hy, CMA, am acting as scribe for Shirlee Latch, MD.  Chief Complaint  Patient presents with  . Establish Care   Subjective:    Establish Care Margaret Wood is a 57 y.o. female who presents today to establish care. She feels fairly well. She reports exercising none due to frequent falls. She reports she is sleeping poorly.  Pt's previous PCP was Dr. Dayton Martes at [facility], who recently moved to [place]. Pt is establish care today to be closer to home. Her PMH includes depression, anxiety, fibromyalgia, tachycardia, bradycardia, and a heart murmur per pt.  Last colonoscopy- about 10 years ago per pt Pt has never had a mammogram Last pap was more than 5 years ago.  Depression screen PHQ 2/9 03/12/2018  Decreased Interest 1  Down, Depressed, Hopeless 1  PHQ - 2 Score 2  Altered sleeping 3  Tired, decreased energy 3  Change in appetite 2  Feeling bad or failure about yourself  2  Trouble concentrating 3  Moving slowly or fidgety/restless 2  Suicidal thoughts 0  PHQ-9 Score 17  Difficult doing work/chores Very difficult   GAD 7 : Generalized Anxiety Score 03/12/2018  Nervous, Anxious, on Edge 3  Control/stop worrying 1  Worry too much - different things 2  Trouble relaxing 3  Restless 3  Easily annoyed or irritable 3  Afraid - awful might happen 3  Total GAD 7 Score 18  Anxiety Difficulty Extremely difficult   Pt is tearful in office when discussing her brother who recently passed away.  Anxiety/Depression: "When I was in the psych ward" was given Cymbalta and because it was "time released" caused serotonin syndrome.  Tried several SSRIs/SNRIs - including Prozac, Zoloft, Cymbalta with side effects.  States that she can only tolerate Xanax.  She is taking Xanax 0.25 mg regularly twice daily.  She is  having worsening panic attacks since her brother's death.  She has not seen a psychiatrist recently.  She does not have a therapist either.  Fibromyalgia: Taking Tramadol 2-3 times daily regularly. Also taking flexeril prn.  Tried gabapentin - made head feel funny. Allergies to Lyrica, Cymbalta.  States that she is currently not able to exercise because her pain is so bad.  She has had side effects from all other medications that she is tried.  She states that her scoliosis also contributes to her fibromyalgia pain.  She also has arthritis of multiple joints that contributes to her chronic pain as well.  IBS-D, GERD, nausea/vomiting: Taking bentyl prn for flare ups.  Worse currently due to her worsened depression anxiety in the setting of her brother's death.  She is also taking a PPI as needed as well as Phenergan when she has severe nausea.  She states this is a problem related to her fibromyalgia.  Dr Dayton Martes was prescribing HRT.  No h/o VTE, stroke, heart disease.  Symptoms are well controlled.  Patient with unspecified tachycardia.  She states this is been a long-standing issue for her.  She was seen by cardiology a few years ago and started on metoprolol.  This seems to control the problem well.  She denies any chest pain, shortness of breath. -----------------------------------------------------------------   Review of Systems  Constitutional: Positive for fatigue. Negative for activity change, appetite change, chills, diaphoresis, fever and unexpected weight  change.  HENT: Positive for rhinorrhea, sinus pressure, sinus pain and sneezing. Negative for congestion, dental problem, drooling, ear discharge, ear pain, facial swelling, hearing loss, mouth sores, nosebleeds, postnasal drip, sore throat, tinnitus, trouble swallowing and voice change.   Eyes: Negative.   Respiratory: Positive for shortness of breath. Negative for apnea, cough, choking, chest tightness, wheezing and stridor.     Cardiovascular: Positive for palpitations. Negative for chest pain and leg swelling.  Gastrointestinal: Positive for abdominal distention, abdominal pain, blood in stool and nausea. Negative for anal bleeding, constipation, diarrhea, rectal pain and vomiting.  Endocrine: Positive for cold intolerance, heat intolerance and polyuria. Negative for polydipsia and polyphagia.  Genitourinary: Negative.   Musculoskeletal: Negative.   Allergic/Immunologic: Negative.   Neurological: Positive for dizziness, tremors, weakness and light-headedness. Negative for seizures, syncope, facial asymmetry, speech difficulty, numbness and headaches.  Hematological: Negative for adenopathy. Bruises/bleeds easily.  Psychiatric/Behavioral: Positive for agitation, decreased concentration and sleep disturbance. Negative for behavioral problems, confusion, dysphoric mood, hallucinations, self-injury and suicidal ideas. The patient is nervous/anxious. The patient is not hyperactive.     Social History      She  reports that she has never smoked. She has never used smokeless tobacco. She reports that she does not drink alcohol or use drugs.       Social History   Socioeconomic History  . Marital status: Married    Spouse name: Jeannett Senior  . Number of children: 2  . Years of education: 48  . Highest education level: High school graduate  Occupational History  . Occupation: UNEMPLOYED    Employer: UNEMPLOYED    Comment: trying to get disability  Social Needs  . Financial resource strain: Not on file  . Food insecurity:    Worry: Not on file    Inability: Not on file  . Transportation needs:    Medical: Not on file    Non-medical: Not on file  Tobacco Use  . Smoking status: Never Smoker  . Smokeless tobacco: Never Used  Substance and Sexual Activity  . Alcohol use: No  . Drug use: No  . Sexual activity: Yes  Lifestyle  . Physical activity:    Days per week: Not on file    Minutes per session: Not on file   . Stress: Not on file  Relationships  . Social connections:    Talks on phone: Not on file    Gets together: Not on file    Attends religious service: Not on file    Active member of club or organization: Not on file    Attends meetings of clubs or organizations: Not on file    Relationship status: Not on file  Other Topics Concern  . Not on file  Social History Narrative   Mother is Sherrie Mustache.    Past Medical History:  Diagnosis Date  . Arthritis   . Depression   . Fibromyalgia   . H/O [facility] spotted fever   . Mitral valve prolapse   . Palpitations   . Scoliosis      Patient Active Problem List   Diagnosis Date Noted  . Sialadenitis 10/22/2017  . Nausea with vomiting 10/11/2015  . Intractable nausea and vomiting 09/30/2015  . Tachycardia 09/30/2015  . Postmenopausal HRT (hormone replacement therapy) 05/03/2015  . MDD (major depressive disorder), recurrent severe, without psychosis (HCC) 07/24/2013  . GAD (generalized anxiety disorder) 07/24/2013  . Suicidal ideation 07/24/2013  . External hemorrhoid, bleeding 01/22/2013  . Symptoms, such as flushing,  sleeplessness, headache, lack of concentration, associated with the menopause 05/29/2012  . Fibromyalgia   . Scoliosis   . SCOLIOSIS, LUMBAR SPINE 10/07/2010  . UNSPECIFIED ARTHROPATHY MULTIPLE SITES 04/27/2008    Past Surgical History:  Procedure Laterality Date  . APPENDECTOMY  1980  . CESAREAN SECTION  1995  . CHOLECYSTECTOMY  1997    Family History        Family Status  Relation Name Status  . Mother  Alive  . Father  Deceased  . Sister  Alive  . Brother  Deceased  . Brother  Alive  . Oneal GroutPat Uncle  Deceased  . Neg Hx  (Not Specified)        Her family history includes Alcohol abuse in her brother and sister; Anxiety disorder in her mother and sister; Arrhythmia in her paternal uncle; Brain cancer in her father; Depression in her mother; Diabetes in her brother; Diverticulitis in her brother;  Healthy in her brother; Heart disease in her brother; Osteoarthritis in her mother. There is no history of Colon cancer, Breast cancer, or Ovarian cancer.      Allergies  Allergen Reactions  . Amoxicillin Nausea Only  . Cymbalta [Duloxetine Hcl]     SOB, palpitations, nausea  . Other     Cannot take anything extended release per pt  . Tegretol [Carbamazepine]     Made her ill     Current Outpatient Medications:  .  ALPRAZolam (XANAX) 0.25 MG tablet, Take 1 tablet (0.25 mg total) by mouth 2 (two) times daily., Disp: 60 tablet, Rfl: 1 .  cyclobenzaprine (FLEXERIL) 10 MG tablet, Take 1 tablet (10 mg total) by mouth at bedtime., Disp: 90 tablet, Rfl: 0 .  dicyclomine (BENTYL) 10 MG capsule, Take 1 capsule (10 mg total) by mouth every 6 (six) hours as needed., Disp: 180 capsule, Rfl: 0 .  estradiol (VIVELLE-DOT) 0.05 MG/24HR patch, Place 1 patch (0.05 mg total) onto the skin 2 (two) times a week., Disp: 90 patch, Rfl: 1 .  hydrocortisone (ANUSOL-HC) 25 MG suppository, UNWRAP AND INSERT 1 SUPPOSITORY (25 MG TOTAL) RECTALLY 2 (TWO) TIMES DAILY., Disp: 12 suppository, Rfl: 0 .  ibuprofen (ADVIL,MOTRIN) 600 MG tablet, Take 1 tablet (600 mg total) every 6 (six) hours as needed by mouth., Disp: 30 tablet, Rfl: 0 .  metoprolol tartrate (LOPRESSOR) 25 MG tablet, Take 1 tablet (25 mg total) by mouth 2 (two) times daily., Disp: 180 tablet, Rfl: 0 .  pantoprazole (PROTONIX) 40 MG tablet, Take 1 tablet (40 mg total) by mouth daily as needed. For GERD., Disp: 90 tablet, Rfl: 0 .  progesterone (PROMETRIUM) 100 MG capsule, Take 1 capsule (100 mg total) by mouth daily., Disp: 90 capsule, Rfl: 1 .  promethazine (PHENERGAN) 12.5 MG tablet, Take 12.5 mg by mouth every 6 (six) hours as needed for nausea or vomiting., Disp: , Rfl:  .  traMADol (ULTRAM) 50 MG tablet, Take 1 tablet (50 mg total) by mouth every 8 (eight) hours., Disp: 90 tablet, Rfl: 1 .  diltiazem (CARDIZEM) 30 MG tablet, Take 1 tablet (30 mg total)  by mouth 4 (four) times daily. (Patient not taking: Reported on 03/12/2018), Disp: 360 tablet, Rfl: 0 .  HYDROcodone-acetaminophen (NORCO/VICODIN) 5-325 MG tablet, Take 2 tablets by mouth 2 (two) times daily as needed. (Patient not taking: Reported on 03/12/2018), Disp: 10 tablet, Rfl: 0 .  meloxicam (MOBIC) 7.5 MG tablet, Take 1 tablet (7.5 mg total) by mouth daily as needed for pain. (Patient not taking: Reported on  03/12/2018), Disp: 90 tablet, Rfl: 0   Patient Care Team: Dianne Dun, MD as PCP - General (Family Medicine) Antonieta Iba, MD as Consulting Physician (Cardiology)      Objective:   Vitals: BP 104/80 (BP Location: Left Arm, Patient Position: Sitting, Cuff Size: Large)   Pulse (!) 103   Temp 98 F (36.7 C) (Oral)   Resp 16   Ht 5\' 4"  (1.626 m)   Wt 194 lb (88 kg)   SpO2 99%   BMI 33.30 kg/m    Vitals:   03/12/18 1007  BP: 104/80  Pulse: (!) 103  Resp: 16  Temp: 98 F (36.7 C)  TempSrc: Oral  SpO2: 99%  Weight: 194 lb (88 kg)  Height: 5\' 4"  (1.626 m)     Physical Exam  Constitutional: She is oriented to person, place, and time. She appears well-developed and well-nourished. No distress.  HENT:  Head: Normocephalic and atraumatic.  Right Ear: External ear normal.  Left Ear: External ear normal.  Nose: Nose normal.  Mouth/Throat: Oropharynx is clear and moist.  Eyes: Pupils are equal, round, and reactive to light. Conjunctivae and EOM are normal. No scleral icterus.  Neck: Neck supple. No thyromegaly present.  Cardiovascular: Normal rate, regular rhythm, normal heart sounds and intact distal pulses.  No murmur heard. Pulmonary/Chest: Breath sounds normal. No respiratory distress. She has no wheezes. She has no rales.  Abdominal: Soft. Bowel sounds are normal. She exhibits no distension. There is no tenderness. There is no rebound and no guarding.  Musculoskeletal: She exhibits tenderness (diffuse, over large muscle groups). She exhibits no edema or  deformity.  Lymphadenopathy:    She has no cervical adenopathy.  Neurological: She is alert and oriented to person, place, and time.  Skin: Skin is warm and dry. Capillary refill takes less than 2 seconds. No rash noted.  Psychiatric: She has a normal mood and affect. Her behavior is normal.  Vitals reviewed.    Depression Screen PHQ 2/9 Scores 03/12/2018  PHQ - 2 Score 2  PHQ- 9 Score 17     Assessment & Plan:    Problem List Items Addressed This Visit      Musculoskeletal and Integument   Scoliosis    Stable See plan for pain management for fibromyalgia below        Other   Fibromyalgia - Primary    Stable, and fairly well controlled Patient is tried and failed other treatment options including gabapentin, Lyrica, Cymbalta Could consider amitriptyline or nortriptyline pending psychiatry referral Tramadol was refilled for 3 months Discussed with patient that she will need to be seen every 3 months to obtain refills of her tramadol and no escalation in dose will be considered in the future      Relevant Orders   Ambulatory referral to Psychiatry   MDD (major depressive disorder), recurrent severe, without psychosis (HCC)    Uncontrolled, see PHQ 9 score Per patient, she has tried multiple SSRIs in SSRIs and she is unable to tolerate them We will refer her to psychiatry for consideration of other therapies      Relevant Orders   Ambulatory referral to Psychiatry   GAD (generalized anxiety disorder)    Uncontrolled Per patient, she is unable to tolerate SSRIs or SSRIs Discussed with patient that chronic benzo therapy has many risks and is often not appropriate for general anxiety disorder  referral to psychiatry for consideration of other therapies Discussed the importance of therapy in addition of  medications      Relevant Orders   Ambulatory referral to Psychiatry   Postmenopausal HRT (hormone replacement therapy)    Discussed risks and benefits of hormone  replacement therapy Patient has an intact uterus and needs to continue progesterone with any estrogen supplement to prevent endometrial hyperplasia She will continue her hormone replacement therapy, but we will continue to discuss and consider discontinuing  refill sent for CombiPatch and Prometrium Precautions discussed      Tachycardia    Unspecified tachycardia Previously seen by cardiology Stable and well-controlled on metoprolol which we will continue          Return in about 3 months (around 06/11/2018) for fibromyalgia.   The entirety of the information documented in the History of Present Illness, Review of Systems and Physical Exam were personally obtained by me. Portions of this information were initially documented by Irving Burton Ratchford, CMA and reviewed by me for thoroughness and accuracy.    Erasmo Downer, MD, MPH [facility] 03/13/2018 9:50 AM

## 2018-03-13 NOTE — Assessment & Plan Note (Signed)
Unspecified tachycardia Previously seen by cardiology Stable and well-controlled on metoprolol which we will continue

## 2018-03-13 NOTE — Assessment & Plan Note (Signed)
Discussed risks and benefits of hormone replacement therapy Patient has an intact uterus and needs to continue progesterone with any estrogen supplement to prevent endometrial hyperplasia She will continue her hormone replacement therapy, but we will continue to discuss and consider discontinuing  refill sent for CombiPatch and Prometrium Precautions discussed

## 2018-03-13 NOTE — Assessment & Plan Note (Signed)
Stable See plan for pain management for fibromyalgia below

## 2018-03-13 NOTE — Assessment & Plan Note (Signed)
Stable, and fairly well controlled Patient is tried and failed other treatment options including gabapentin, Lyrica, Cymbalta Could consider amitriptyline or nortriptyline pending psychiatry referral Tramadol was refilled for 3 months Discussed with patient that she will need to be seen every 3 months to obtain refills of her tramadol and no escalation in dose will be considered in the future

## 2018-03-13 NOTE — Assessment & Plan Note (Signed)
Uncontrolled, see PHQ 9 score Per patient, she has tried multiple SSRIs in SSRIs and she is unable to tolerate them We will refer her to psychiatry for consideration of other therapies

## 2018-03-13 NOTE — Assessment & Plan Note (Signed)
Uncontrolled Per patient, she is unable to tolerate SSRIs or SSRIs Discussed with patient that chronic benzo therapy has many risks and is often not appropriate for general anxiety disorder  referral to psychiatry for consideration of other therapies Discussed the importance of therapy in addition of medications

## 2018-04-14 ENCOUNTER — Other Ambulatory Visit: Payer: Self-pay | Admitting: Family Medicine

## 2018-04-15 ENCOUNTER — Other Ambulatory Visit: Payer: Self-pay

## 2018-04-15 ENCOUNTER — Ambulatory Visit (INDEPENDENT_AMBULATORY_CARE_PROVIDER_SITE_OTHER): Payer: 59 | Admitting: Psychiatry

## 2018-04-15 ENCOUNTER — Other Ambulatory Visit
Admission: RE | Admit: 2018-04-15 | Discharge: 2018-04-15 | Disposition: A | Discharge status: Home / Self Care | Payer: 59 | Source: Ambulatory Visit | Attending: Psychiatry | Admitting: Psychiatry

## 2018-04-15 ENCOUNTER — Encounter: Payer: Self-pay | Admitting: Psychiatry

## 2018-04-15 VITALS — BP 125/83 | HR 90 | Temp 98.4°F | Wt 193.6 lb

## 2018-04-15 DIAGNOSIS — F411 Generalized anxiety disorder: Secondary | ICD-10-CM

## 2018-04-15 DIAGNOSIS — F330 Major depressive disorder, recurrent, mild: Secondary | ICD-10-CM | POA: Diagnosis present

## 2018-04-15 DIAGNOSIS — Z634 Disappearance and death of family member: Secondary | ICD-10-CM

## 2018-04-15 DIAGNOSIS — F5105 Insomnia due to other mental disorder: Secondary | ICD-10-CM | POA: Diagnosis not present

## 2018-04-15 LAB — TSH: TSH: 2.212 u[IU]/mL (ref 0.350–4.500)

## 2018-04-15 LAB — FOLATE: FOLATE: 16.7 ng/mL (ref >5.9)

## 2018-04-15 LAB — VITAMIN B12: VITAMIN B 12: 460 pg/mL (ref 180–914)

## 2018-04-15 MED ORDER — MIRTAZAPINE 15 MG PO TABS: 7.5000 mg | ORAL_TABLET | Freq: Every day | ORAL | 0 refills | Status: DC

## 2018-04-15 NOTE — Progress Notes (Signed)
Psychiatric Initial Adult Assessment   Patient Identification: Margaret Wood MRN:  [id number] Date of Evaluation:  04/15/2018 Referral Source: Shirlee Latch MD Chief Complaint:  ' I am depressed and anxious." Chief Complaint    Establish Care; Anxiety; Depression; Fibromyalgia     Visit Diagnosis:    ICD-10-CM   1. GAD (generalized anxiety disorder) F41.1   2. MDD (major depressive disorder), recurrent episode, mild (HCC) F33.0   3. Bereavement Z63.4   4. Insomnia due to mental condition F51.05     History of Present Illness:  Margaret Wood is a 57 year old Caucasian female married, unemployed, lives in [place], has a history of depression, anxiety, irregular heart rate, fibromyalgia, IBS, scoliosis, presented to the clinic today to establish care.  Patient reports she has been struggling with depression and anxiety since the past several years.  She reports she was able to manage her anxiety and depression up until now by just taking Xanax.  She reports she was not taking Xanax daily but as needed several times a week.  She reports her primary medical doctor who was prescribing her the medication felt she should not be on Xanax long-term and hence referred her to the clinic here.  Patient reports she is motivated to try new medications and get off the Xanax if possible.  Patient reports recent sadness, reduced appetite, sleep problems, lethargy and so on.  She reports her brother passed away from alcoholism recently and that is also making her more depressed.  She reports worsening depressive symptoms since the past few weeks.  She denies any suicidality.  She denies any perceptual disturbances.  Reports sleep problems since the past several years .  She reports she has tried medications like Ambien in the past.  She currently takes Benadryl as needed which helps her.  She reports she is a Product/process development scientist.  She reports some nervousness, inability to relax and so on since the past few weeks.   She also reports a history of panic attacks.  She reports when she is in public situations like [employer] she goes into a panic mode.  She reports her panic symptoms as racing heart rate, inability to breathe and the world closing in on her.  She reports taking a Xanax to help her with the same.  Patient reports history of trauma growing up.  She reports she was raised in a cult-Jehovah witness.  She reports she did not want to be a Jehovah's Witness and hence she was almost shunned by her family and society.  She reports she continues to have a relationship with her mother however her not being a Jehovah witness is always a problem.  Patient reports a supportive husband with whom she has been married since the past more than 30 years.  She also has twin identical sons who are adults and she has a good relationship with them.  Patient reports a past history of suicide attempt in 2014.  She reports she was admitted to the behavioral health unit in [place] at that time.  She reports it was a traumatic experience for her.  Patient reports she was started on a medication called Cymbalta at that time however she reports she had some symptoms of serotonin syndrome like nausea and required medical help with the same.  Reports she is sensitive to medications especially time release medications.  She reports 'time release medications ',make her to have serious side effects.  She reports she was asked to follow-up with a clinic here in [place]  but she never followed through with recommendations.  She reports she stopped taking the medications they prescribed as soon as she were discharged.  Associated Signs/Symptoms: Depression Symptoms:  depressed mood, fatigue, anxiety, panic attacks, disturbed sleep, decreased labido, (Hypo) Manic Symptoms:  denies Anxiety Symptoms:  Excessive Worry, Panic Symptoms, Psychotic Symptoms:  denies PTSD Symptoms: Negative  Past Psychiatric History: She reports a history  of depression and anxiety since the past several years.  One inpatient admission to behavioral health unit in 2014.  Patient reports one suicide attempt in 2014 when she overdosed on medications like tramadol, Xanax, Benadryl.  Patient reports her medications were being prescribed by her primary medical doctor.  She is only on Xanax at this time.  Previous Psychotropic Medications: Yes -Zoloft, Prozac, Cymbalta,trazodone, ambien  Substance Abuse History in the last 12 months:  No.  Consequences of Substance Abuse: Negative  Past Medical History:  Past Medical History:  Diagnosis Date  . Anxiety   . Arthritis   . Depression   . Fibromyalgia   . H/O [facility] spotted fever   . Mitral valve prolapse   . Palpitations   . Scoliosis   . Serotonin syndrome     Past Surgical History:  Procedure Laterality Date  . APPENDECTOMY  1980  . CESAREAN SECTION  1995  . CHOLECYSTECTOMY  1997    Family Psychiatric History: Brother-alcoholism, mother-anxiety, depression.  Family History:  Family History  Problem Relation Age of Onset  . Depression Mother   . Anxiety disorder Mother   . Osteoarthritis Mother   . Brain cancer Father        GBM  . Alcohol abuse Sister   . Anxiety disorder Sister   . Diabetes Brother   . Diverticulitis Brother   . Heart disease Brother   . Alcohol abuse Brother   . Healthy Brother   . Arrhythmia Paternal Uncle   . Brain cancer Paternal Grandfather        GBM  . Colon cancer Neg Hx   . Breast cancer Neg Hx   . Ovarian cancer Neg Hx     Social History:   Social History   Socioeconomic History  . Marital status: Married    Spouse name: Annie Main  . Number of children: 2  . Years of education: 68  . Highest education level: High school graduate  Occupational History  . Occupation: UNEMPLOYED    Employer: UNEMPLOYED    Comment: trying to get disability  Social Needs  . Financial resource strain: Not hard at all  . Food insecurity:     Worry: Never true    Inability: Never true  . Transportation needs:    Medical: No    Non-medical: No  Tobacco Use  . Smoking status: Never Smoker  . Smokeless tobacco: Never Used  Substance and Sexual Activity  . Alcohol use: No  . Drug use: No  . Sexual activity: Yes    Partners: Male    Birth control/protection: None  Lifestyle  . Physical activity:    Days per week: 0 days    Minutes per session: 0 min  . Stress: Very much  Relationships  . Social connections:    Talks on phone: Not on file    Gets together: Not on file    Attends religious service: Never    Active member of club or organization: No    Attends meetings of clubs or organizations: Never    Relationship status: Married  Other Topics  Concern  . Not on file  Social History Narrative   Mother is Sherrie Mustache.    Additional Social History: Patient is married.  She is unemployed.  She lives in [place].  She has 2 sons-identical twins.  They are adults and very supportive.  Patient reports she was raised in a Jehovah witness called.  She was almost shot by her community since she did not want to follow that religion.  She reports she does not follow any religion at this time.  She reports an okay relationship with her mother. Brother recently passed away.  Allergies:   Allergies  Allergen Reactions  . Amoxicillin Nausea Only  . Cymbalta [Duloxetine Hcl]     SOB, palpitations, nausea  . Other     Cannot take anything extended release per pt  . Tegretol [Carbamazepine]     Made her ill    Metabolic Disorder Labs: Lab Results  Component Value Date   HGBA1C 5.1 02/23/2014   No results found for: PROLACTIN No results found for: CHOL, TRIG, HDL, CHOLHDL, VLDL, LDLCALC   Current Medications: Current Outpatient Medications  Medication Sig Dispense Refill  . ALPRAZolam (XANAX) 0.25 MG tablet Take 1 tablet (0.25 mg total) by mouth 2 (two) times daily. 60 tablet 1  . cyclobenzaprine (FLEXERIL) 10 MG  tablet Take 1 tablet (10 mg total) by mouth at bedtime. 90 tablet 1  . dicyclomine (BENTYL) 10 MG capsule Take 1 capsule (10 mg total) by mouth every 6 (six) hours as needed. 90 capsule 1  . estradiol (VIVELLE-DOT) 0.05 MG/24HR patch Place 1 patch (0.05 mg total) onto the skin 2 (two) times a week. 24 patch 3  . hydrocortisone (ANUSOL-HC) 25 MG suppository UNWRAP AND INSERT 1 SUPPOSITORY (25 MG TOTAL) RECTALLY 2 (TWO) TIMES DAILY. 12 suppository 0  . ibuprofen (ADVIL,MOTRIN) 600 MG tablet Take 1 tablet (600 mg total) every 6 (six) hours as needed by mouth. 30 tablet 0  . metoprolol tartrate (LOPRESSOR) 25 MG tablet Take 1 tablet (25 mg total) by mouth 2 (two) times daily. 180 tablet 3  . pantoprazole (PROTONIX) 40 MG tablet Take 1 tablet (40 mg total) by mouth daily as needed. For GERD. 30 tablet 2  . progesterone (PROMETRIUM) 100 MG capsule Take 1 capsule (100 mg total) by mouth daily. 90 capsule 3  . promethazine (PHENERGAN) 12.5 MG tablet TAKE 1 TABLET(12.5 MG) BY MOUTH EVERY 6 HOURS AS NEEDED FOR NAUSEA OR VOMITING 30 tablet 0  . traMADol (ULTRAM) 50 MG tablet Take 1 tablet (50 mg total) by mouth every 8 (eight) hours. 90 tablet 2  . mirtazapine (REMERON) 15 MG tablet Take 0.5 tablets (7.5 mg total) by mouth at bedtime. 15 tablet 0   No current facility-administered medications for this visit.     Neurologic: Headache: No Seizure: No Paresthesias:No  Musculoskeletal: Strength & Muscle Tone: within normal limits Gait & Station: normal Patient leans: N/A  Psychiatric Specialty Exam: Review of Systems  Psychiatric/Behavioral: Positive for depression. The patient is nervous/anxious and has insomnia.   All other systems reviewed and are negative.   Blood pressure 125/83, pulse 90, temperature 98.4 F (36.9 C), temperature source Oral, weight 193 lb 9.6 oz (87.8 kg).Body mass index is 33.23 kg/m.  General Appearance: Casual  Eye Contact:  Fair  Speech:  Clear and Coherent  Volume:   Normal  Mood:  Anxious, Depressed and Dysphoric  Affect:  Congruent  Thought Process:  Goal Directed and Descriptions of Associations: Intact  Orientation:  Full (Time, Place, and Person)  Thought Content:  Logical  Suicidal Thoughts:  No  Homicidal Thoughts:  No  Memory:  Immediate;   Fair Recent;   Fair Remote;   Fair  Judgement:  Fair  Insight:  Fair  Psychomotor Activity:  Normal  Concentration:  Concentration: Fair and Attention Span: Fair  Recall:  [employer] of Knowledge:Fair  Language: Fair  Akathisia:  No  Handed:  Right  AIMS (if indicated):  na  Assets:  Communication Skills Desire for Improvement Housing Social Support  ADL's:  Intact  Cognition: WNL  Sleep:  poor    Treatment Plan Summary:Margaret Wood is a 57 year old Caucasian female, married, unemployed, has a history of depression, anxiety, medical problems like IBS, scoliosis, irregular heart rate, fibromyalgia, presented to the clinic today to establish care.  Patient is biologically predisposed given her history of trauma growing up.  She also has a family history of mental health problems.  She does have good social support and is motivated to start medications as well as psychotherapy.  Plan as noted below. Medication management and Plan as noted below Plan  MDD Start mirtazapine 7.5 mg p.o. nightly Refer for CBT with Ms. Miguel Dibble. PHQ 9 equals 15  For Anxiety sx GAD 7 equals 12 She is currently on Xanax prescribed by her PMD.  Discussed with her that it has to be weaned off slowly.  Discussed with her the risk of being on benzodiazepine therapy.  Patient does have panic symptoms.  Her panic attacks are triggered by public/social situations.  Discussed referral for psychotherapy.  Will refer her to Ms. Miguel Dibble.  For insomnia Discussed with her to stop taking the Benadryl for now. We will start mirtazapine 7.5 mg p.o. Nightly  Bereavement  Refer for counseling to Ms.Alonna Minium.  We will  get the following labs- TSH, B12, folate, vitamin D.  I have reviewed medical records in [facility] Per her [facility] inpatient admission.  Follow-up in clinic in 2 weeks or sooner if needed.  Discussed with her that she may need to be seen in clinic on a frequent basis due to her sensitivity to medications in general.  More than 50 % of the time was spent for psychoeducation and supportive psychotherapy and care coordination.  This note was generated in part or whole with voice recognition software. Voice recognition is usually quite accurate but there are transcription errors that can and very often do occur. I apologize for any typographical errors that were not detected and corrected.      Ursula Alert, MD 5/20/20193:31 PM

## 2018-04-15 NOTE — Patient Instructions (Signed)
Mirtazapine tablets What is this medicine? MIRTAZAPINE (mir TAZ a peen) is used to treat depression. This medicine may be used for other purposes; ask your health care provider or pharmacist if you have questions. COMMON BRAND NAME(S): Remeron What should I tell my health care provider before I take this medicine? They need to know if you have any of these conditions: -bipolar disorder -glaucoma -kidney disease -liver disease -suicidal thoughts -an unusual or allergic reaction to mirtazapine, other medicines, foods, dyes, or preservatives -pregnant or trying to get pregnant -breast-feeding How should I use this medicine? Take this medicine by mouth with a glass of water. Follow the directions on the prescription label. Take your medicine at regular intervals. Do not take your medicine more often than directed. Do not stop taking this medicine suddenly except upon the advice of your doctor. Stopping this medicine too quickly may cause serious side effects or your condition may worsen. A special MedGuide will be given to you by the pharmacist with each prescription and refill. Be sure to read this information carefully each time. Talk to your pediatrician regarding the use of this medicine in children. Special care may be needed. Overdosage: If you think you have taken too much of this medicine contact a poison control center or emergency room at once. NOTE: This medicine is only for you. Do not share this medicine with others. What if I miss a dose? If you miss a dose, take it as soon as you can. If it is almost time for your next dose, take only that dose. Do not take double or extra doses. What may interact with this medicine? Do not take this medicine with any of the following medications: -linezolid -MAOIs like Carbex, Eldepryl, Marplan, Nardil, and Parnate -methylene blue (injected into a vein) This medicine may also interact with the following medications: -alcohol -antiviral  medicines for HIV or AIDS -certain medicines that treat or prevent blood clots like warfarin -certain medicines for depression, anxiety, or psychotic disturbances -certain medicines for fungal infections like ketoconazole and itraconazole -certain medicines for migraine headache like almotriptan, eletriptan, frovatriptan, naratriptan, rizatriptan, sumatriptan, zolmitriptan -certain medicines for seizures like carbamazepine or phenytoin -certain medicines for sleep -cimetidine -erythromycin -fentanyl -lithium -medicines for blood pressure -nefazodone -rasagiline -rifampin -supplements like St. John's wort, kava kava, valerian -tramadol -tryptophan This list may not describe all possible interactions. Give your health care provider a list of all the medicines, herbs, non-prescription drugs, or dietary supplements you use. Also tell them if you smoke, drink alcohol, or use illegal drugs. Some items may interact with your medicine. What should I watch for while using this medicine? Tell your doctor if your symptoms do not get better or if they get worse. Visit your doctor or health care professional for regular checks on your progress. Because it may take several weeks to see the full effects of this medicine, it is important to continue your treatment as prescribed by your doctor. Patients and their families should watch out for new or worsening thoughts of suicide or depression. Also watch out for sudden changes in feelings such as feeling anxious, agitated, panicky, irritable, hostile, aggressive, impulsive, severely restless, overly excited and hyperactive, or not being able to sleep. If this happens, especially at the beginning of treatment or after a change in dose, call your health care professional. You may get drowsy or dizzy. Do not drive, use machinery, or do anything that needs mental alertness until you know how this medicine affects you. Do not   stand or sit up quickly, especially if  you are an older patient. This reduces the risk of dizzy or fainting spells. Alcohol may interfere with the effect of this medicine. Avoid alcoholic drinks. This medicine may cause dry eyes and blurred vision. If you wear contact lenses you may feel some discomfort. Lubricating drops may help. See your eye doctor if the problem does not go away or is severe. Your mouth may get dry. Chewing sugarless gum or sucking hard candy, and drinking plenty of water may help. Contact your doctor if the problem does not go away or is severe. What side effects may I notice from receiving this medicine? Side effects that you should report to your doctor or health care professional as soon as possible: -allergic reactions like skin rash, itching or hives, swelling of the face, lips, or tongue -anxious -changes in vision -chest pain -confusion -elevated mood, decreased need for sleep, racing thoughts, impulsive behavior -eye pain -fast, irregular heartbeat -feeling faint or lightheaded, falls -feeling agitated, angry, or irritable -fever or chills, sore throat -hallucination, loss of contact with reality -loss of balance or coordination -mouth sores -redness, blistering, peeling or loosening of the skin, including inside the mouth -restlessness, pacing, inability to keep still -seizures -stiff muscles -suicidal thoughts or other mood changes -trouble passing urine or change in the amount of urine -trouble sleeping -unusual bleeding or bruising -unusually weak or tired -vomiting Side effects that usually do not require medical attention (report to your doctor or health care professional if they continue or are bothersome): -change in appetite -constipation -dizziness -dry mouth -muscle aches or pains -nausea -tired -weight gain This list may not describe all possible side effects. Call your doctor for medical advice about side effects. You may report side effects to FDA at 1-800-FDA-1088. Where  should I keep my medicine? Keep out of the reach of children. Store at room temperature between 15 and 30 degrees C (59 and 86 degrees F) Protect from light and moisture. Throw away any unused medicine after the expiration date. NOTE: This sheet is a summary. It may not cover all possible information. If you have questions about this medicine, talk to your doctor, pharmacist, or health care provider.  2018 Elsevier/Gold Standard (2016-04-13 17:30:45) Serotonin Syndrome Serotonin is a brain chemical that regulates the nervous system, which includes the brain, spinal cord, and nerves. Serotonin appears to play a role in all types of behavior, including appetite, emotions, movement, thinking, and response to stress. Excessively high levels of serotonin in the body can cause serotonin syndrome, which is a very dangerous condition. What are the causes? This condition can be caused by taking medicines or drugs that increase the level of serotonin in your body. These include:  Antidepressant medicines.  Migraine medicines.  Certain pain medicines.  Certain recreational drugs, including ecstasy, LSD, cocaine, and amphetamines.  Over-the-counter cough or cold medicines that contain dextromethorphan.  Certain herbal supplements, including St. John's wort, ginseng, and nutmeg.  This condition usually occurs when you take these medicines or drugs in combination, but it can also happen with a high dose of a single medicine or drug. What increases the risk? This condition is more likely to develop in:  People who have recently increased the dosage of medicine that increases the serotonin level.  People who just started taking medicine that increases the serotonin level.  What are the signs or symptoms? Symptoms of this condition usually happens within several hours of a medicine change. Symptoms include:  Headache.  Muscle twitching or stiffness.  Diarrhea.  Confusion.  Restlessness or  agitation.  Shivering or goose bumps.  Loss of muscle coordination.  Rapid heart rate.  Sweating.  Severe cases of serotonin syndromecan cause:  Irregular heartbeat.  Seizures.  Loss of consciousness.  High fever.  How is this diagnosed? This condition is diagnosed with a medical history and physical exam. You will be asked aboutyour symptoms and your use of medicines and recreational drugs. Your health care provider may also order lab work or additional tests to rule out other causes of your symptoms. How is this treated? The treatment for this condition depends on the severity of your symptoms. For mild cases, stopping the medicine that caused your condition is usually all that is needed. For moderate to severe cases, hospitalization is required to monitor you and to prevent further muscle damage. Follow these instructions at home:  Take over-the-counter and prescription medicines only as told by your health care provider. This is important.  Check with your health care provider before you start taking any new prescriptions, over-the-counter medicines, herbs, or supplements.  Avoid combining any medicines that can cause this condition to occur.  Keep all follow-up visits as told by your health care provider.This is important.  Maintain a healthy lifestyle. ? Eat healthy foods. ? Get plenty of sleep. ? Exercise regularly. ? Do not drink alcohol. ? Do not use recreational drugs. Contact a health care provider if:  Medicines do not seem to be helping.  Your symptoms do not improve or they get worse.  You have trouble taking care of yourself. Get help right away if:  You have worsening confusion, severe headache, chest pain, high fever, seizures, or loss of consciousness.  You have serious thoughts about hurting yourself or others.  You experience serious side effects of medicine, such as swelling of your face, lips, tongue, or throat. This information is not  intended to replace advice given to you by your health care provider. Make sure you discuss any questions you have with your health care provider. Document Released: 12/21/2004 Document Revised: 07/08/2016 Document Reviewed: 11/26/2014 Elsevier Interactive Patient Education  [employer].

## 2018-04-16 LAB — VITAMIN D 25 HYDROXY (VIT D DEFICIENCY, FRACTURES): VIT D 25 HYDROXY: 15.8 ng/mL — AB (ref 30.0–100.0)

## 2018-04-18 ENCOUNTER — Other Ambulatory Visit: Payer: Self-pay | Admitting: Family Medicine

## 2018-04-22 ENCOUNTER — Other Ambulatory Visit: Payer: Self-pay | Admitting: Family Medicine

## 2018-05-03 ENCOUNTER — Other Ambulatory Visit: Payer: Self-pay | Admitting: Family Medicine

## 2018-05-21 ENCOUNTER — Telehealth: Payer: Self-pay

## 2018-05-21 NOTE — Telephone Encounter (Signed)
labwork mailed to pt- faxed and confirmed labwork to pcp 

## 2018-06-03 ENCOUNTER — Telehealth: Payer: Self-pay

## 2018-06-03 NOTE — Telephone Encounter (Signed)
These are hospital labs that were checked more than 1 month ago. PCP reviewed these accidentally.

## 2018-06-03 NOTE — Telephone Encounter (Signed)
-----   Message from Erasmo DownerAngela M Bacigalupo, MD sent at 06/03/2018  8:49 AM EDT ----- Normal B12, folate, thyroid function.  Vit D level is low.  Is she taking any supplement?  Would recommend Ergocalciferol (D3) 50,000 units once weekly x12 weeks and then daily 1000-2000 units OTC supplement.  OK to send Rx if patient agrees.  Erasmo DownerBacigalupo, Angela M, MD, MPH [facility] 06/03/2018 8:49 AM

## 2018-06-05 ENCOUNTER — Telehealth: Payer: Self-pay

## 2018-06-05 NOTE — Telephone Encounter (Signed)
-----   Message from Erasmo DownerAngela M Bacigalupo, MD sent at 06/05/2018  8:51 AM EDT ----- Can you actually call her about the Vitamin D (result note that I did accidentally)?  She should take Ergocalciferol (D3) 50,000 units once weekly x12 weeks and then start daily OTC supplement with 1000-2000 units daily. Thanks!   ----- Message ----- From: Shelly Bombardatchford, Emily Maria, CMA Sent: 06/04/2018   4:17 PM To: Erasmo DownerAngela M Bacigalupo, MD    ----- Message ----- From: Remi Haggardouch, Caroline D Sent: 05/22/2018   2:06 PM To: Darcel SmallingEmily Maria Ratchford, CMA  Review incoming fax

## 2018-06-05 NOTE — Telephone Encounter (Signed)
Tried calling pt; NA and could not leave message. Will try again later, and pt has appointment on 06/12/2018.

## 2018-06-12 ENCOUNTER — Ambulatory Visit (INDEPENDENT_AMBULATORY_CARE_PROVIDER_SITE_OTHER): Payer: 59 | Admitting: Family Medicine

## 2018-06-12 ENCOUNTER — Encounter: Payer: Self-pay | Admitting: Family Medicine

## 2018-06-12 VITALS — BP 138/92 | HR 95 | Temp 98.1°F | Resp 16 | Wt 191.0 lb

## 2018-06-12 DIAGNOSIS — M797 Fibromyalgia: Secondary | ICD-10-CM

## 2018-06-12 DIAGNOSIS — F411 Generalized anxiety disorder: Secondary | ICD-10-CM | POA: Diagnosis not present

## 2018-06-12 DIAGNOSIS — Z1211 Encounter for screening for malignant neoplasm of colon: Secondary | ICD-10-CM | POA: Diagnosis not present

## 2018-06-12 MED ORDER — TRAMADOL HCL 50 MG PO TABS: 50.0000 mg | ORAL_TABLET | Freq: Three times a day (TID) | ORAL | 2 refills | Status: DC

## 2018-06-12 MED ORDER — ALPRAZOLAM 0.25 MG PO TABS: 0.2500 mg | ORAL_TABLET | Freq: Two times a day (BID) | ORAL | 2 refills | Status: DC | PRN

## 2018-06-12 MED ORDER — VITAMIN D (ERGOCALCIFEROL) 1.25 MG (50000 UNIT) PO CAPS: 50000.0000 [IU] | ORAL_CAPSULE | ORAL | 3 refills | Status: DC

## 2018-06-12 NOTE — Telephone Encounter (Signed)
Pt had OV today, and this medication was sent in at the OV.

## 2018-06-12 NOTE — Progress Notes (Signed)
Patient: Margaret Wood Female    DOB: 21-Aug-1961   57 y.o.   MRN: [id number] Visit Date: 06/12/2018  Today's Provider: Shirlee LatchAngela Bacigalupo, MD   I, Joslyn HyEmily Ratchford, CMA, am acting as scribe for Shirlee LatchAngela Bacigalupo, MD.  Chief Complaint  Patient presents with  . Fibromyalgia   Subjective:    HPI     Follow up for Fibromyalgia  The patient was last seen for this 3 months ago. Changes made at last visit include refilling Tramadol. She continues to take Tramadol 2-3 times daily She is also taking ibuprofen and tylenol prn.  She reports good compliance with treatment. She feels that condition is Unchanged. She is not having side effects.  ------------------------------------------------------------------------------------  Pt saw psych on 04/15/2018 for her GAD and MDD. She states Dr. Elna BreslowEappen prescribed Remeron, which caused heart palpitations. She D/C this medication after one day, and restarted her alprazolam. She is requesting a refill of her alprazolam. Taking 0.25mg  1-2 times daily prn.  Does have panic attack symptoms triggered by social situations.  She states she has not asked her psychiatrist to refill this, as she has not seen her for a FU.   Allergies  Allergen Reactions  . Amoxicillin Nausea Only  . Cymbalta [Duloxetine Hcl]     SOB, palpitations, nausea  . Other     Cannot take anything extended release per pt  . Tegretol [Carbamazepine]     Made her ill     Current Outpatient Medications:  .  ALPRAZolam (XANAX) 0.25 MG tablet, Take 1 tablet (0.25 mg total) by mouth 2 (two) times daily., Disp: 60 tablet, Rfl: 1 .  cyclobenzaprine (FLEXERIL) 10 MG tablet, Take 1 tablet (10 mg total) by mouth at bedtime., Disp: 90 tablet, Rfl: 1 .  dicyclomine (BENTYL) 10 MG capsule, TAKE 1 CAPSULE(10 MG) BY MOUTH EVERY 6 HOURS AS NEEDED, Disp: 90 capsule, Rfl: 2 .  estradiol (VIVELLE-DOT) 0.05 MG/24HR patch, Place 1 patch (0.05 mg total) onto the skin 2 (two) times a  week., Disp: 24 patch, Rfl: 3 .  hydrocortisone (ANUSOL-HC) 25 MG suppository, UNWRAP AND INSERT 1 SUPPOSITORY (25 MG TOTAL) RECTALLY 2 (TWO) TIMES DAILY., Disp: 12 suppository, Rfl: 0 .  ibuprofen (ADVIL,MOTRIN) 600 MG tablet, Take 1 tablet (600 mg total) every 6 (six) hours as needed by mouth., Disp: 30 tablet, Rfl: 0 .  metoprolol tartrate (LOPRESSOR) 25 MG tablet, Take 1 tablet (25 mg total) by mouth 2 (two) times daily., Disp: 180 tablet, Rfl: 3 .  pantoprazole (PROTONIX) 40 MG tablet, Take 1 tablet (40 mg total) by mouth daily as needed. For GERD., Disp: 30 tablet, Rfl: 2 .  progesterone (PROMETRIUM) 100 MG capsule, Take 1 capsule (100 mg total) by mouth daily., Disp: 90 capsule, Rfl: 3 .  promethazine (PHENERGAN) 12.5 MG tablet, TAKE 1 TABLET(12.5 MG) BY MOUTH EVERY 6 HOURS AS NEEDED FOR NAUSEA OR VOMITING, Disp: 30 tablet, Rfl: 0 .  traMADol (ULTRAM) 50 MG tablet, Take 1 tablet (50 mg total) by mouth every 8 (eight) hours., Disp: 90 tablet, Rfl: 2 .  Vitamin D, Ergocalciferol, (DRISDOL) 50000 units CAPS capsule, Take 1 capsule (50,000 Units total) by mouth every 7 (seven) days. (Patient not taking: Reported on 06/12/2018), Disp: 4 capsule, Rfl: 3  Review of Systems  Constitutional: Positive for fatigue. Negative for activity change, appetite change, chills, diaphoresis, fever and unexpected weight change.  Musculoskeletal: Positive for myalgias (stable).  Psychiatric/Behavioral: The patient is nervous/anxious (stable).  Social History   Tobacco Use  . Smoking status: Never Smoker  . Smokeless tobacco: Never Used  Substance Use Topics  . Alcohol use: No   Objective:   BP (!) 138/92 (BP Location: Left Arm, Patient Position: Sitting, Cuff Size: Large)   Pulse 95   Temp 98.1 F (36.7 C) (Oral)   Resp 16   Wt 191 lb (86.6 kg)   SpO2 96%   BMI 32.79 kg/m  Vitals:   06/12/18 1547  BP: (!) 138/92  Pulse: 95  Resp: 16  Temp: 98.1 F (36.7 C)  TempSrc: Oral  SpO2: 96%    Weight: 191 lb (86.6 kg)     Physical Exam  Constitutional: She is oriented to person, place, and time. She appears well-developed and well-nourished. No distress.  HENT:  Head: Normocephalic and atraumatic.  Eyes: Conjunctivae are normal. No scleral icterus.  Cardiovascular: Normal rate, regular rhythm, normal heart sounds and intact distal pulses.  No murmur heard. Pulmonary/Chest: Effort normal and breath sounds normal. No respiratory distress. She has no wheezes. She has no rales.  Musculoskeletal: She exhibits no edema.  Neurological: She is alert and oriented to person, place, and time.  Skin: Skin is warm and dry. Capillary refill takes less than 2 seconds. No rash noted.  Psychiatric: She has a normal mood and affect. Her behavior is normal.  Vitals reviewed.       Assessment & Plan:   Problem List Items Addressed This Visit      Other   Fibromyalgia - Primary    Stable and fairly well controlled Patient has tried and failed gabapentin, cymbalta, and lyrica Tramadol refilled x3 months No plans for dose escalation in the future      GAD (generalized anxiety disorder)    Followed by Dr Elna Breslow now Stopped remeron after only one dose Psych recommended decreasing benzo therapy Will decrease number of Xanax per month from 60 to 45 and encourage to decrease to once daily  Discussed importance of following up with psych and therapy      Relevant Medications   ALPRAZolam (XANAX) 0.25 MG tablet    Other Visit Diagnoses    Screen for colon cancer       Relevant Orders   Cologuard      Return in about 6 weeks (around 07/24/2018) for CPE.   The entirety of the information documented in the History of Present Illness, Review of Systems and Physical Exam were personally obtained by me. Portions of this information were initially documented by Irving Burton Ratchford, CMA and reviewed by me for thoroughness and accuracy.    Erasmo Downer, MD, MPH [facility] 06/12/2018 4:14 PM

## 2018-06-12 NOTE — Assessment & Plan Note (Signed)
Followed by Dr Elna BreslowEappen now Stopped remeron after only one dose Psych recommended decreasing benzo therapy Will decrease number of Xanax per month from 60 to 45 and encourage to decrease to once daily  Discussed importance of following up with psych and therapy

## 2018-06-12 NOTE — Assessment & Plan Note (Signed)
Stable and fairly well controlled Patient has tried and failed gabapentin, cymbalta, and lyrica Tramadol refilled x3 months No plans for dose escalation in the future

## 2018-07-10 ENCOUNTER — Other Ambulatory Visit: Payer: Self-pay | Admitting: Family Medicine

## 2018-08-07 ENCOUNTER — Encounter: Payer: Self-pay | Admitting: Family Medicine

## 2018-08-11 ENCOUNTER — Other Ambulatory Visit: Payer: Self-pay | Admitting: Family Medicine

## 2018-09-11 ENCOUNTER — Ambulatory Visit: Payer: 59 | Admitting: Family Medicine

## 2018-09-18 ENCOUNTER — Ambulatory Visit: Payer: 59 | Admitting: Family Medicine

## 2018-09-18 ENCOUNTER — Telehealth: Payer: Self-pay

## 2018-09-18 NOTE — Telephone Encounter (Signed)
TA-I spoke to pt/she says that she has tried other providers but wants to be back with you only/she will keep tomorrow's appt/thx dmf

## 2018-09-18 NOTE — Telephone Encounter (Signed)
I LMOVM asking pt if she is planning to have Dr. Dayton Martes as her PCP or the one she just established with because she can only have one and in Dr. Penelope Coop last note in July she advised for pt to RTN in 6 weeks for CPE/If she is not planning to have Dr. Dayton Martes for PCP then OV for 10.24.19 must be cancelled otherwise can keep that/thx dmf

## 2018-09-19 ENCOUNTER — Encounter: Payer: Self-pay | Admitting: Family Medicine

## 2018-09-19 ENCOUNTER — Ambulatory Visit (INDEPENDENT_AMBULATORY_CARE_PROVIDER_SITE_OTHER): Payer: 59 | Admitting: Family Medicine

## 2018-09-19 VITALS — BP 122/84 | HR 79 | Temp 97.8°F | Ht 64.0 in | Wt 192.4 lb

## 2018-09-19 DIAGNOSIS — F411 Generalized anxiety disorder: Secondary | ICD-10-CM

## 2018-09-19 DIAGNOSIS — Z79899 Other long term (current) drug therapy: Secondary | ICD-10-CM

## 2018-09-19 DIAGNOSIS — M797 Fibromyalgia: Secondary | ICD-10-CM

## 2018-09-19 DIAGNOSIS — Z79891 Long term (current) use of opiate analgesic: Secondary | ICD-10-CM

## 2018-09-19 DIAGNOSIS — Z23 Encounter for immunization: Secondary | ICD-10-CM

## 2018-09-19 MED ORDER — HYDROCORTISONE ACETATE 25 MG RE SUPP: RECTAL | 0 refills | Status: DC

## 2018-09-19 NOTE — Progress Notes (Signed)
Subjective:   Patient ID: Margaret Wood, female    DOB: [birth year], 57 y.o.   MRN: [id number]  Margaret Wood is a pleasant 57 y.o. year old female who presents to clinic today with New Patient (Initial Visit) (Patient is here today to re-establish care with Dr. Deborra Medina.  States that she has tried different doctors since Dr. Deborra Medina moved but just wants to go back to her.  PMP checked and will need CSC and UDS. Patient is in agreement.  She agrees to get flu shot today. She states that she will not need her Alprazolam and Tramadol until around this time in November.  She will schedule a CPE with PAP within the next couple of months as she is way overdue.)  on 09/19/2018  HPI:   Here to re establish care.  Fibromyalgia- Allergic to Lyrica and Cymbalta- causes extreme sedation.  Pain is mainly in her shoulders and hips and has unfortunately worsened since I last saw her almost two years ago.  She is now taking Tramadol 2-3 times daily along with ibuprofen and tylenol as needed.  Indication for chronic opioid: fibromyalgia Medication and dose: Tramadol 50 mg- 1 tab by mouth three times daily as needed # pills per month: 90 Last UDS date: 09/19/2018 Opioid Treatment Agreement signed (Y/N): Y Opioid Treatment Agreement last reviewed with patient:  09/19/18 [facility] reviewed this encounter (include red flags):  reviewed- no red flags   GAD/MDD-was followed by psychiatry, Dr. Shea Evans. She prescribed Remeron in 03/2018, which caused heart palpitations. She D/C'd this medication after one day, and restarted her alprazolam.  Her PCP was refilling her xanax. No longer seeing psychiatrist.  Taking xanax 0.25mg  1-2 times daily prn.  Does have panic attack symptoms triggered by social situations.  Uses it maybe 4 times per week.  It depends on what she had going on this week. Current Outpatient Medications on File Prior to Visit  Medication Sig Dispense Refill  . ALPRAZolam (XANAX) 0.25 MG tablet Take  1 tablet (0.25 mg total) by mouth 2 (two) times daily as needed for anxiety. 45 tablet 2  . cyclobenzaprine (FLEXERIL) 10 MG tablet Take 1 tablet (10 mg total) by mouth at bedtime. 90 tablet 1  . dicyclomine (BENTYL) 10 MG capsule TAKE 1 CAPSULE(10 MG) BY MOUTH EVERY 6 HOURS AS NEEDED 90 capsule 2  . estradiol (VIVELLE-DOT) 0.05 MG/24HR patch Place 1 patch (0.05 mg total) onto the skin 2 (two) times a week. 24 patch 3  . hydrocortisone (ANUSOL-HC) 25 MG suppository UNWRAP AND INSERT 1 SUPPOSITORY (25 MG TOTAL) RECTALLY 2 (TWO) TIMES DAILY. 12 suppository 0  . ibuprofen (ADVIL,MOTRIN) 600 MG tablet Take 1 tablet (600 mg total) every 6 (six) hours as needed by mouth. 30 tablet 0  . metoprolol tartrate (LOPRESSOR) 25 MG tablet Take 1 tablet (25 mg total) by mouth 2 (two) times daily. 180 tablet 3  . pantoprazole (PROTONIX) 40 MG tablet Take 1 tablet (40 mg total) by mouth daily as needed. For GERD. 30 tablet 2  . progesterone (PROMETRIUM) 100 MG capsule Take 1 capsule (100 mg total) by mouth daily. 90 capsule 3  . promethazine (PHENERGAN) 12.5 MG tablet TAKE 1 TABLET(12.5 MG) BY MOUTH EVERY 6 HOURS AS NEEDED FOR NAUSEA OR VOMITING 30 tablet 0  . promethazine (PHENERGAN) 12.5 MG tablet TAKE 1 TABLET(12.5 MG) BY MOUTH EVERY 6 HOURS AS NEEDED FOR NAUSEA OR VOMITING 30 tablet 2  . traMADol (ULTRAM) 50 MG tablet Take 1 tablet (  50 mg total) by mouth every 8 (eight) hours. 90 tablet 2   No current facility-administered medications on file prior to visit.     Allergies  Allergen Reactions  . Amoxicillin Nausea Only  . Cymbalta [Duloxetine Hcl]     SOB, palpitations, nausea  . Other     Cannot take anything extended release per pt  . Tegretol [Carbamazepine]     Made her ill    Past Medical History:  Diagnosis Date  . Anxiety   . Arthritis   . Depression   . Fibromyalgia   . H/O [facility] spotted fever   . Mitral valve prolapse   . Palpitations   . Scoliosis   . Serotonin syndrome      Past Surgical History:  Procedure Laterality Date  . APPENDECTOMY  1980  . CESAREAN SECTION  1995  . CHOLECYSTECTOMY  1997    Family History  Problem Relation Age of Onset  . Depression Mother   . Anxiety disorder Mother   . Osteoarthritis Mother   . Brain cancer Father        [facility]  . Alcohol abuse Sister   . Anxiety disorder Sister   . Diabetes Brother   . Diverticulitis Brother   . Heart disease Brother   . Alcohol abuse Brother   . Healthy Brother   . Arrhythmia Paternal Uncle   . Brain cancer Paternal Grandfather        [facility]  . Colon cancer Neg Hx   . Breast cancer Neg Hx   . Ovarian cancer Neg Hx     Social History   Socioeconomic History  . Marital status: Married    Spouse name: Jeannett Senior  . Number of children: 2  . Years of education: 5  . Highest education level: High school graduate  Occupational History  . Occupation: UNEMPLOYED    Employer: UNEMPLOYED    Comment: trying to get disability  Social Needs  . Financial resource strain: Not hard at all  . Food insecurity:    Worry: Never true    Inability: Never true  . Transportation needs:    Medical: No    Non-medical: No  Tobacco Use  . Smoking status: Never Smoker  . Smokeless tobacco: Never Used  Substance and Sexual Activity  . Alcohol use: No  . Drug use: No  . Sexual activity: Yes    Partners: Male    Birth control/protection: None  Lifestyle  . Physical activity:    Days per week: 0 days    Minutes per session: 0 min  . Stress: Very much  Relationships  . Social connections:    Talks on phone: Not on file    Gets together: Not on file    Attends religious service: Never    Active member of club or organization: No    Attends meetings of clubs or organizations: Never    Relationship status: Married  . Intimate partner violence:    Fear of current or ex partner: No    Emotionally abused: No    Physically abused: No    Forced sexual activity: No  Other Topics Concern  . Not  on file  Social History Narrative   Mother is Sherrie Mustache.   The PMH, PSH, Social History, Family History, Medications, and allergies have been reviewed in [facility], and have been updated if relevant.   Review of Systems  Musculoskeletal: Positive for arthralgias. Negative for back pain and gait problem.  Psychiatric/Behavioral: Negative  for behavioral problems, confusion, decreased concentration, dysphoric mood, hallucinations, self-injury, sleep disturbance and suicidal ideas. The patient is nervous/anxious. The patient is not hyperactive.   All other systems reviewed and are negative.      Objective:    BP 122/84 (BP Location: Left Arm, Patient Position: Sitting, Cuff Size: Normal)   Pulse 79   Temp 97.8 F (36.6 C) (Oral)   Ht 5\' 4"  (1.626 m)   Wt 192 lb 6.4 oz (87.3 kg)   SpO2 98%   BMI 33.03 kg/m    Physical Exam  Constitutional: She is oriented to person, place, and time. She appears well-developed and well-nourished. No distress.  HENT:  Head: Normocephalic and atraumatic.  Eyes: EOM are normal.  Neck: Normal range of motion.  Cardiovascular: Normal rate.  Pulmonary/Chest: Effort normal.  Musculoskeletal: Normal range of motion. She exhibits no edema.  Neurological: She is alert and oriented to person, place, and time. No cranial nerve deficit.  Skin: She is not diaphoretic.  Psychiatric: She has a normal mood and affect. Her behavior is normal. Judgment and thought content normal.  Nursing note and vitals reviewed.         Assessment & Plan:   Fibromyalgia - Plan: Pain Mgmt, Profile 8 w/Conf, U  GAD (generalized anxiety disorder)  Need for influenza vaccination - Plan: Flu Vaccine QUAD 6+ mos PF IM (Fluarix Quad PF)  Long term prescription opiate use - Plan: Pain Mgmt, Profile 8 w/Conf, U  Long term prescription benzodiazepine use - Plan: Pain Mgmt, Profile 8 w/Conf, U No follow-ups on file.

## 2018-09-19 NOTE — Assessment & Plan Note (Addendum)
Indication for chronic opioid: fibromyalgia Medication and dose: Tramadol 50 mg- 1 tab by mouth three times daily as needed # pills per month: 90 Last UDS date: 09/19/2018 Opioid Treatment Agreement signed (Y/N): Y Opioid Treatment Agreement last reviewed with patient:  09/19/18 [facility] reviewed this encounter (include red flags):  reviewed- no red flags  >25 minutes spent in face to face time with patient, >50% spent in counselling or coordination of care

## 2018-09-19 NOTE — Patient Instructions (Signed)
Great to see you!   

## 2018-09-19 NOTE — Assessment & Plan Note (Signed)
Feels she is doing well with only as needed xanax for panic attacks and social anxiety. Appropriate use- has multiple drug intolerances- remeron was the most recent intolerance. UDS and CSC updated today. PMP showed no red flags.

## 2018-09-22 LAB — PAIN MGMT, PROFILE 8 W/CONF, U
6 Acetylmorphine: NEGATIVE ng/mL (ref <10)
ALPHAHYDROXYALPRAZOLAM: 51 ng/mL — AB (ref <25)
ALPHAHYDROXYMIDAZOLAM: NEGATIVE ng/mL (ref <50)
AMINOCLONAZEPAM: NEGATIVE ng/mL (ref <25)
AMPHETAMINES: NEGATIVE ng/mL (ref <500)
Alcohol Metabolites: NEGATIVE ng/mL (ref <500)
Alphahydroxytriazolam: NEGATIVE ng/mL (ref <50)
BUPRENORPHINE, URINE: NEGATIVE ng/mL (ref <5)
Benzodiazepines: POSITIVE ng/mL — AB (ref <100)
COCAINE METABOLITE: NEGATIVE ng/mL (ref <150)
CREATININE: 60.1 mg/dL
Hydroxyethylflurazepam: NEGATIVE ng/mL (ref <50)
Lorazepam: NEGATIVE ng/mL (ref <50)
MDMA: NEGATIVE ng/mL (ref <500)
Marijuana Metabolite: NEGATIVE ng/mL (ref <20)
NORDIAZEPAM: NEGATIVE ng/mL (ref <50)
OXIDANT: NEGATIVE ug/mL (ref <200)
Opiates: NEGATIVE ng/mL (ref <100)
Oxazepam: NEGATIVE ng/mL (ref <50)
Oxycodone: NEGATIVE ng/mL (ref <100)
PH: 6.93 (ref 4.5–9.0)
TEMAZEPAM: NEGATIVE ng/mL (ref <50)

## 2018-09-30 ENCOUNTER — Ambulatory Visit: Payer: Self-pay

## 2018-09-30 ENCOUNTER — Other Ambulatory Visit: Payer: Self-pay

## 2018-09-30 ENCOUNTER — Emergency Department
Admission: EM | Admit: 2018-09-30 | Discharge: 2018-09-30 | Disposition: A | Discharge status: Home / Self Care | Payer: 59 | Attending: Emergency Medicine | Admitting: Emergency Medicine

## 2018-09-30 DIAGNOSIS — R112 Nausea with vomiting, unspecified: Secondary | ICD-10-CM | POA: Diagnosis not present

## 2018-09-30 DIAGNOSIS — R1115 Cyclical vomiting syndrome unrelated to migraine: Secondary | ICD-10-CM

## 2018-09-30 DIAGNOSIS — R1013 Epigastric pain: Secondary | ICD-10-CM | POA: Diagnosis not present

## 2018-09-30 LAB — COMPREHENSIVE METABOLIC PANEL
ALT: 12 U/L (ref 0–44)
AST: 19 U/L (ref 15–41)
Albumin: 4.7 g/dL (ref 3.5–5.0)
Alkaline Phosphatase: 76 U/L (ref 38–126)
Anion gap: 13 (ref 5–15)
BILIRUBIN TOTAL: 1.4 mg/dL — AB (ref 0.3–1.2)
BUN: 14 mg/dL (ref 6–20)
CALCIUM: 9.3 mg/dL (ref 8.9–10.3)
CO2: 24 mmol/L (ref 22–32)
CREATININE: 0.97 mg/dL (ref 0.44–1.00)
Chloride: 103 mmol/L (ref 98–111)
GFR calc Af Amer: >60 mL/min (ref >60)
Glucose, Bld: 106 mg/dL — ABNORMAL HIGH (ref 70–99)
Potassium: 4 mmol/L (ref 3.5–5.1)
Sodium: 140 mmol/L (ref 135–145)
TOTAL PROTEIN: 8.7 g/dL — AB (ref 6.5–8.1)

## 2018-09-30 LAB — CBC
HCT: 42 % (ref 36.0–46.0)
Hemoglobin: 13.8 g/dL (ref 12.0–15.0)
MCH: 29.5 pg (ref 26.0–34.0)
MCHC: 32.9 g/dL (ref 30.0–36.0)
MCV: 89.7 fL (ref 80.0–100.0)
PLATELETS: 291 10*3/uL (ref 150–400)
RBC: 4.68 MIL/uL (ref 3.87–5.11)
RDW: 12.7 % (ref 11.5–15.5)
WBC: 10.4 10*3/uL (ref 4.0–10.5)
nRBC: 0 % (ref 0.0–0.2)

## 2018-09-30 LAB — URINALYSIS, COMPLETE (UACMP) WITH MICROSCOPIC
Bilirubin Urine: NEGATIVE
GLUCOSE, UA: NEGATIVE mg/dL
HGB URINE DIPSTICK: NEGATIVE
Ketones, ur: 5 mg/dL — AB
Leukocytes, UA: NEGATIVE
NITRITE: NEGATIVE
PROTEIN: NEGATIVE mg/dL
SPECIFIC GRAVITY, URINE: 1.002 — AB (ref 1.005–1.030)
pH: 6 (ref 5.0–8.0)

## 2018-09-30 LAB — GLUCOSE, CAPILLARY: Glucose-Capillary: 91 mg/dL (ref 70–99)

## 2018-09-30 LAB — TROPONIN I: Troponin I: <0.03 ng/mL (ref <0.03)

## 2018-09-30 LAB — LIPASE, BLOOD: Lipase: 30 U/L (ref 11–51)

## 2018-09-30 MED ORDER — PROMETHAZINE HCL 25 MG RE SUPP: 25.0000 mg | Freq: Four times a day (QID) | RECTAL | 1 refills | Status: DC | PRN

## 2018-09-30 MED ORDER — ONDANSETRON 4 MG PO TBDP
4.0000 mg | ORAL_TABLET | Freq: Once | ORAL | Status: AC | PRN
  Administered 2018-09-30: 4 mg via ORAL
  Filled 2018-09-30: qty 1

## 2018-09-30 MED ORDER — FAMOTIDINE IN NACL 20-0.9 MG/50ML-% IV SOLN
20.0000 mg | Freq: Once | INTRAVENOUS | Status: AC
  Administered 2018-09-30: 20 mg via INTRAVENOUS
  Filled 2018-09-30: qty 50

## 2018-09-30 MED ORDER — SODIUM CHLORIDE 0.9 % IV SOLN
Freq: Once | INTRAVENOUS | Status: AC
Start: 2018-09-30 — End: 2018-09-30
  Administered 2018-09-30: 999 mL via INTRAVENOUS

## 2018-09-30 MED ORDER — ONDANSETRON 4 MG PO TBDP: 4.0000 mg | ORAL_TABLET | Freq: Three times a day (TID) | ORAL | 0 refills | Status: DC | PRN

## 2018-09-30 MED ORDER — PROMETHAZINE HCL 25 MG/ML IJ SOLN
25.0000 mg | Freq: Once | INTRAMUSCULAR | Status: AC
  Administered 2018-09-30: 25 mg via INTRAVENOUS
  Filled 2018-09-30: qty 1

## 2018-09-30 NOTE — ED Triage Notes (Signed)
C/o stabbing epigastric pain, nausea, emesis since 7AM. Pt did not eat prior to having pain. Denies diarrhea. Pt tearful.

## 2018-09-30 NOTE — ED Provider Notes (Signed)
[facility] Emergency Department Provider Note       Time seen: ----------------------------------------- 2:17 PM on 09/30/2018 -----------------------------------------   I have reviewed the triage vital signs and the nursing notes.  HISTORY   Chief Complaint Abdominal Pain    HPI Margaret Wood is a 57 y.o. female with a history of anxiety, depression, fibromyalgia, [place] spotted fever, nausea vomiting who presents to the ED for epigastric pain with nausea and vomiting since 7 AM.  Patient states she did not eat prior to having the pain.  She denies any diarrhea, fever, chills or other complaints.  She also took meclizine at home for vertigo.  She has not had any improvement in her symptoms.  She also takes Protonix on occasion but this has not helped.  She has been seen in the past for similar.  Past Medical History:  Diagnosis Date  . Anxiety   . Arthritis   . Depression   . Fibromyalgia   . H/O [facility] spotted fever   . Mitral valve prolapse   . Palpitations   . Scoliosis   . Serotonin syndrome     Patient Active Problem List   Diagnosis Date Noted  . Nausea with vomiting 10/11/2015  . Tachycardia 09/30/2015  . Postmenopausal HRT (hormone replacement therapy) 05/03/2015  . MDD (major depressive disorder), recurrent severe, without psychosis (HCC) 07/24/2013  . GAD (generalized anxiety disorder) 07/24/2013  . Suicidal ideation 07/24/2013  . External hemorrhoid, bleeding 01/22/2013  . Fibromyalgia   . Scoliosis   . UNSPECIFIED ARTHROPATHY MULTIPLE SITES 04/27/2008    Past Surgical History:  Procedure Laterality Date  . APPENDECTOMY  1980  . CESAREAN SECTION  1995  . CHOLECYSTECTOMY  1997    Allergies Amoxicillin; Cymbalta [duloxetine hcl]; Other; and Tegretol [carbamazepine]  Social History Social History   Tobacco Use  . Smoking status: Never Smoker  . Smokeless tobacco: Never Used  Substance Use Topics  .  Alcohol use: No  . Drug use: No   Review of Systems Constitutional: Negative for fever. Cardiovascular: Negative for chest pain. Respiratory: Negative for shortness of breath. Gastrointestinal: Nausea for abdominal pain, nausea vomiting Musculoskeletal: Negative for back pain. Skin: Negative for rash. Neurological: Negative for headaches, focal weakness or numbness.  All systems negative/normal/unremarkable except as stated in the HPI  ____________________________________________   PHYSICAL EXAM:  VITAL SIGNS: ED Triage Vitals [09/30/18 1209]  Enc Vitals Group     BP (!) 143/89     Pulse Rate (!) 108     Resp 20     Temp 98 F (36.7 C)     Temp Source Oral     SpO2 100 %     Weight 191 lb 12.8 oz (87 kg)     Height 5\' 4"  (1.626 m)     Head Circumference      Peak Flow      Pain Score 6     Pain Loc      Pain Edu?      Excl. in GC?    Constitutional: Alert and oriented. Well appearing and in no distress. Eyes: Conjunctivae are normal. Normal extraocular movements. Cardiovascular: Normal rate, regular rhythm. No murmurs, rubs, or gallops. Respiratory: Normal respiratory effort without tachypnea nor retractions. Breath sounds are clear and equal bilaterally. No wheezes/rales/rhonchi. Gastrointestinal: Mild epigastric tenderness, no rebound or guarding.  Normal bowel sounds. Musculoskeletal: Nontender with normal range of motion in extremities. No lower extremity tenderness nor edema. Neurologic:  Normal  speech and language. No gross focal neurologic deficits are appreciated.  Skin:  Skin is warm, dry and intact. No rash noted. Psychiatric: Mood and affect are normal. Speech and behavior are normal.  ____________________________________________  EKG: Interpreted by me.  Sinus rhythm with a rate of 110 bpm, normal PR interval, normal QRS, normal QT  ____________________________________________  ED COURSE:  As part of my medical decision making, I reviewed the  following data within the electronic MEDICAL RECORD NUMBER History obtained from family if available, nursing notes, old chart and ekg, as well as notes from prior ED visits. Patient presented for likely cyclic vomiting syndrome, we will assess with labs as indicated at this time.   Procedures ____________________________________________   LABS (pertinent positives/negatives)  Labs Reviewed  COMPREHENSIVE METABOLIC PANEL - Abnormal; Notable for the following components:      Result Value   Glucose, Bld 106 (*)    Total Protein 8.7 (*)    Total Bilirubin 1.4 (*)    All other components within normal limits  URINALYSIS, COMPLETE (UACMP) WITH MICROSCOPIC - Abnormal; Notable for the following components:   Color, Urine STRAW (*)    APPearance CLEAR (*)    Specific Gravity, Urine 1.002 (*)    Ketones, ur 5 (*)    Bacteria, UA RARE (*)    All other components within normal limits  LIPASE, BLOOD  CBC  TROPONIN I  GLUCOSE, CAPILLARY   ____________________________________________  DIFFERENTIAL DIAGNOSIS   Cyclic vomiting syndrome, dehydration, electrolyte abnormality, GERD, peptic ulcer disease  FINAL ASSESSMENT AND PLAN  Cyclic vomiting syndrome   Plan: The patient had presented for abdominal pain and vomiting. Patient's labs are reassuring.  Patient was feeling better after IV fluids as well as IV Phenergan and Pepcid.  Have advised that she needs to take her Protonix every day.  I will prescribe Zofran ODT as well as Phenergan suppositories for her.  She is cleared for outpatient follow-up with gastroenterology.   Ulice Dash, MD   Note: This note was generated in part or whole with voice recognition software. Voice recognition is usually quite accurate but there are transcription errors that can and very often do occur. I apologize for any typographical errors that were not detected and corrected.     Emily Filbert, MD 09/30/18 [phone]

## 2018-09-30 NOTE — ED Notes (Signed)
AAOx3.  Skin warm and dry.  NAD 

## 2018-09-30 NOTE — ED Notes (Signed)
Two attempts to start IV unsuccessful.

## 2018-09-30 NOTE — Telephone Encounter (Signed)
Patient is experience extreme vertigo. And they have a question about drug interaction for promethazine (PHENERGAN) 12.5 MG tablet [[id number]] and Meclizine 25mg . Please advise with Patients husband [phone]   Attempted to call pt on mobile but no answer at this time. Left message on voicemail for pt to return call to the office.

## 2018-09-30 NOTE — Telephone Encounter (Signed)
Attempted to contact pt; left message on voicemail [phone].

## 2018-10-14 ENCOUNTER — Ambulatory Visit (INDEPENDENT_AMBULATORY_CARE_PROVIDER_SITE_OTHER): Payer: 59 | Admitting: Family Medicine

## 2018-10-14 ENCOUNTER — Encounter: Payer: Self-pay | Admitting: Family Medicine

## 2018-10-14 ENCOUNTER — Other Ambulatory Visit (HOSPITAL_COMMUNITY)
Admission: RE | Admit: 2018-10-14 | Discharge: 2018-10-14 | Disposition: A | Discharge status: Home / Self Care | Payer: 59 | Source: Ambulatory Visit | Attending: Family Medicine | Admitting: Family Medicine

## 2018-10-14 ENCOUNTER — Other Ambulatory Visit (INDEPENDENT_AMBULATORY_CARE_PROVIDER_SITE_OTHER): Payer: 59

## 2018-10-14 VITALS — BP 126/88 | HR 81 | Temp 97.7°F | Ht 63.5 in | Wt 193.6 lb

## 2018-10-14 DIAGNOSIS — Z79890 Hormone replacement therapy (postmenopausal): Secondary | ICD-10-CM

## 2018-10-14 DIAGNOSIS — N898 Other specified noninflammatory disorders of vagina: Secondary | ICD-10-CM | POA: Insufficient documentation

## 2018-10-14 DIAGNOSIS — Z13220 Encounter for screening for lipoid disorders: Secondary | ICD-10-CM

## 2018-10-14 DIAGNOSIS — Z01419 Encounter for gynecological examination (general) (routine) without abnormal findings: Secondary | ICD-10-CM

## 2018-10-14 DIAGNOSIS — F332 Major depressive disorder, recurrent severe without psychotic features: Secondary | ICD-10-CM

## 2018-10-14 DIAGNOSIS — F411 Generalized anxiety disorder: Secondary | ICD-10-CM

## 2018-10-14 DIAGNOSIS — Z1239 Encounter for other screening for malignant neoplasm of breast: Secondary | ICD-10-CM

## 2018-10-14 DIAGNOSIS — E559 Vitamin D deficiency, unspecified: Secondary | ICD-10-CM | POA: Diagnosis not present

## 2018-10-14 DIAGNOSIS — R1115 Cyclical vomiting syndrome unrelated to migraine: Secondary | ICD-10-CM | POA: Insufficient documentation

## 2018-10-14 LAB — COMPREHENSIVE METABOLIC PANEL
ALT: 10 U/L (ref 0–35)
AST: 15 U/L (ref 0–37)
Albumin: 4.2 g/dL (ref 3.5–5.2)
Alkaline Phosphatase: 62 U/L (ref 39–117)
BUN: 15 mg/dL (ref 6–23)
CALCIUM: 10.3 mg/dL (ref 8.4–10.5)
CO2: 29 meq/L (ref 19–32)
Chloride: 101 mEq/L (ref 96–112)
Creatinine, Ser: 1.01 mg/dL (ref 0.40–1.20)
GFR: 60.41 mL/min (ref >60.00)
GLUCOSE: 86 mg/dL (ref 70–99)
POTASSIUM: 4.2 meq/L (ref 3.5–5.1)
Sodium: 139 mEq/L (ref 135–145)
Total Bilirubin: 0.7 mg/dL (ref 0.2–1.2)
Total Protein: 7.5 g/dL (ref 6.0–8.3)

## 2018-10-14 LAB — CBC WITH DIFFERENTIAL/PLATELET
Basophils Absolute: 0.1 10*3/uL (ref 0.0–0.1)
Basophils Relative: 1.5 % (ref 0.0–3.0)
EOS PCT: 4.3 % (ref 0.0–5.0)
Eosinophils Absolute: 0.3 10*3/uL (ref 0.0–0.7)
HEMATOCRIT: 39.3 % (ref 36.0–46.0)
Hemoglobin: 13.3 g/dL (ref 12.0–15.0)
LYMPHS PCT: 35.3 % (ref 12.0–46.0)
Lymphs Abs: 2.3 10*3/uL (ref 0.7–4.0)
MCHC: 33.8 g/dL (ref 30.0–36.0)
MCV: 89.5 fl (ref 78.0–100.0)
MONOS PCT: 7.4 % (ref 3.0–12.0)
Monocytes Absolute: 0.5 10*3/uL (ref 0.1–1.0)
NEUTROS ABS: 3.4 10*3/uL (ref 1.4–7.7)
Neutrophils Relative %: 51.5 % (ref 43.0–77.0)
Platelets: 271 10*3/uL (ref 150.0–400.0)
RBC: 4.4 Mil/uL (ref 3.87–5.11)
RDW: 13.4 % (ref 11.5–15.5)
WBC: 6.6 10*3/uL (ref 4.0–10.5)

## 2018-10-14 LAB — LDL CHOLESTEROL, DIRECT: Direct LDL: 77 mg/dL

## 2018-10-14 LAB — LIPID PANEL
CHOL/HDL RATIO: 6
Cholesterol: 212 mg/dL — ABNORMAL HIGH (ref 0–200)
HDL: 33.4 mg/dL — AB (ref >39.00)
Triglycerides: 628 mg/dL — ABNORMAL HIGH (ref 0.0–149.0)

## 2018-10-14 LAB — VITAMIN D 25 HYDROXY (VIT D DEFICIENCY, FRACTURES): VITD: 13.65 ng/mL — AB (ref 30.00–100.00)

## 2018-10-14 LAB — TSH: TSH: 3.45 u[IU]/mL (ref 0.35–4.50)

## 2018-10-14 MED ORDER — PROMETHAZINE HCL 12.5 MG PO TABS: ORAL_TABLET | ORAL | 0 refills | Status: DC

## 2018-10-14 MED ORDER — PANTOPRAZOLE SODIUM 40 MG PO TBEC: 40.0000 mg | DELAYED_RELEASE_TABLET | Freq: Every day | ORAL | 2 refills | Status: DC | PRN

## 2018-10-14 MED ORDER — HYDROCORTISONE ACETATE 25 MG RE SUPP: RECTAL | 0 refills | Status: DC

## 2018-10-14 MED ORDER — CYCLOBENZAPRINE HCL 10 MG PO TABS: 10.0000 mg | ORAL_TABLET | Freq: Every day | ORAL | 1 refills | Status: DC

## 2018-10-14 MED ORDER — ONDANSETRON 4 MG PO TBDP: 4.0000 mg | ORAL_TABLET | Freq: Three times a day (TID) | ORAL | 0 refills | Status: DC | PRN

## 2018-10-14 MED ORDER — ALPRAZOLAM 0.25 MG PO TABS: 0.2500 mg | ORAL_TABLET | Freq: Two times a day (BID) | ORAL | 2 refills | Status: DC | PRN

## 2018-10-14 MED ORDER — METOPROLOL TARTRATE 25 MG PO TABS: 25.0000 mg | ORAL_TABLET | Freq: Two times a day (BID) | ORAL | 3 refills | Status: DC

## 2018-10-14 MED ORDER — ESTRADIOL 0.05 MG/24HR TD PTTW: 1.0000 | MEDICATED_PATCH | TRANSDERMAL | 3 refills | Status: DC

## 2018-10-14 MED ORDER — DICYCLOMINE HCL 10 MG PO CAPS: ORAL_CAPSULE | ORAL | 2 refills | Status: DC

## 2018-10-14 MED ORDER — TRAMADOL HCL 50 MG PO TABS: 50.0000 mg | ORAL_TABLET | Freq: Three times a day (TID) | ORAL | 2 refills | Status: DC

## 2018-10-14 MED ORDER — PROGESTERONE MICRONIZED 100 MG PO CAPS: 100.0000 mg | ORAL_CAPSULE | Freq: Every day | ORAL | 3 refills | Status: DC

## 2018-10-14 NOTE — Patient Instructions (Addendum)
Great to see you. I will call you with your lab results from today and you can view them online.   Please call [facility] to schedule your mammogram.  We are also referring you to GI.

## 2018-10-14 NOTE — Addendum Note (Signed)
Addended by: Lerry LinerFREDERICK, Roslynn Holte. M on: 10/14/2018 11:10 AM   Modules accepted: Orders

## 2018-10-14 NOTE — Progress Notes (Signed)
Subjective:   Patient ID: Margaret Wood, female    DOB: 1961-06-27, 57 y.o.   MRN: [id number]  Margaret Wood is a pleasant 57 y.o. year old female who presents to clinic today with Annual Exam (Patient is here today for a CPE with PAP.  She had a sip of Pepsi just now only.  Vit-D last drawn on 5.2019 was 15.8 and 12 weeks of 50k 1qqk given per chart.  She states that she has had some vaginal discharge and itching.  She states that she had UBIOME completed 2-years-ago.  She agrees to have Mammogram at [place].  She has a Cologuard kit at home to complete.Marland Kitchen)  on 10/14/2018  HPI:  Health Maintenance  Topic Date Due  . PAP SMEAR  06/20/1984  . MAMMOGRAM  06/20/2013  . TETANUS/TDAP  10/20/2018 (Originally 06/20/1982)  . COLONOSCOPY  11/27/2018 (Originally 06/20/2013)  . INFLUENZA VACCINE  Completed  . Hepatitis C Screening  Completed  . HIV Screening  Completed   Is not currently taking a gynecologist- on HRT- hot flashes have improved.  Fibromyalgia- Allergic to Lyrica and Cymbalta- causes extreme sedation. Pain is mainly in her shoulders and hips and has unfortunately worsened since I last saw her almost two years ago.  Cyclical vomiting- was seen in the ER for this on 09/30/18- note reviewed. Still nauseated. Taking as needed zofran.  GAD/MDD-was followed by psychiatry, Dr. Shea Evans. She prescribed Remeron in 03/2018, which caused heart palpitations. She D/C'd this medication after one day, and restarted her alprazolam. Has multiple drug intolerances- remeron was the most recent intolerance.  . No longerseeing psychiatrist.  Taking xanax 0.71m 1-2 times daily prn. Does have panic attack symptoms triggered by social situations. Uses it maybe 4 times per week.  It depends on what she had going on this week. Has not had any suicidal thoughts since she stopped having periods.  Anxiety and depression screens are a little worse because she suddenly lost her brother- bled out from  esophageal varices.   GAD 7 : Generalized Anxiety Score 10/14/2018 03/12/2018  Nervous, Anxious, on Edge 2 3  Control/stop worrying 1 1  Worry too much - different things 1 2  Trouble relaxing 2 3  Restless 1 3  Easily annoyed or irritable 2 3  Afraid - awful might happen 2 3  Total GAD 7 Score 11 18  Anxiety Difficulty Somewhat difficult Extremely difficult    [facility] 10/14/2018 03/12/2018  Decreased Interest 1 1  Down, Depressed, Hopeless 1 1  PHQ - 2 Score 2 2  Altered sleeping 1 3  Tired, decreased energy 2 3  Change in appetite 1 2  Feeling bad or failure about yourself  2 2  Trouble concentrating 1 3  Moving slowly or fidgety/restless 1 2  Suicidal thoughts 0 0  PHQ-9 Score 10 17  Difficult doing work/chores Somewhat difficult Very difficult   Current Outpatient Medications on File Prior to Visit  Medication Sig Dispense Refill  . ibuprofen (ADVIL,MOTRIN) 600 MG tablet Take 1 tablet (600 mg total) every 6 (six) hours as needed by mouth. 30 tablet 0  . promethazine (PHENERGAN) 25 MG suppository Place 1 suppository (25 mg total) rectally every 6 (six) hours as needed for nausea. 12 suppository 1   No current facility-administered medications on file prior to visit.     Allergies  Allergen Reactions  . Amoxicillin Nausea Only  . Cymbalta [Duloxetine Hcl]     SOB, palpitations, nausea  .  Other     Cannot take anything extended release per pt  . Tegretol [Carbamazepine]     Made her ill    Past Medical History:  Diagnosis Date  . Anxiety   . Arthritis   . Depression   . Fibromyalgia   . H/O [facility] spotted fever   . Mitral valve prolapse   . Palpitations   . Scoliosis   . Serotonin syndrome     Past Surgical History:  Procedure Laterality Date  . APPENDECTOMY  1980  . CESAREAN SECTION  1995  . CHOLECYSTECTOMY  1997    Family History  Problem Relation Age of Onset  . Depression Mother   . Anxiety disorder Mother   .  Osteoarthritis Mother   . Brain cancer Father        [facility]  . Alcohol abuse Sister   . Anxiety disorder Sister   . Diabetes Brother   . Diverticulitis Brother   . Heart disease Brother   . Alcohol abuse Brother   . Healthy Brother   . Arrhythmia Paternal Uncle   . Brain cancer Paternal Grandfather        [facility]  . Colon cancer Neg Hx   . Breast cancer Neg Hx   . Ovarian cancer Neg Hx     Social History   Socioeconomic History  . Marital status: Married    Spouse name: Annie Main  . Number of children: 2  . Years of education: 48  . Highest education level: High school graduate  Occupational History  . Occupation: UNEMPLOYED    Employer: UNEMPLOYED    Comment: trying to get disability  Social Needs  . Financial resource strain: Not hard at all  . Food insecurity:    Worry: Never true    Inability: Never true  . Transportation needs:    Medical: No    Non-medical: No  Tobacco Use  . Smoking status: Never Smoker  . Smokeless tobacco: Never Used  Substance and Sexual Activity  . Alcohol use: No  . Drug use: No  . Sexual activity: Yes    Partners: Male    Birth control/protection: None  Lifestyle  . Physical activity:    Days per week: 0 days    Minutes per session: 0 min  . Stress: Very much  Relationships  . Social connections:    Talks on phone: Not on file    Gets together: Not on file    Attends religious service: Never    Active member of club or organization: No    Attends meetings of clubs or organizations: Never    Relationship status: Married  . Intimate partner violence:    Fear of current or ex partner: No    Emotionally abused: No    Physically abused: No    Forced sexual activity: No  Other Topics Concern  . Not on file  Social History Narrative   Mother is Doylene Canning.   The PMH, PSH, Social History, Family History, Medications, and allergies have been reviewed in [facility], and have been updated if relevant.  Review of Systems  Constitutional:  Negative.   HENT: Negative.   Eyes: Negative.   Respiratory: Negative.   Cardiovascular: Negative.   Gastrointestinal: Positive for nausea and vomiting. Negative for abdominal distention, abdominal pain, anal bleeding, blood in stool, constipation and diarrhea.  Endocrine: Negative.   Genitourinary: Negative.   Musculoskeletal: Negative.   Skin: Negative.   Allergic/Immunologic: Negative.   Neurological: Negative.  Hematological: Negative.   Psychiatric/Behavioral: Negative for self-injury, sleep disturbance and suicidal ideas. The patient is nervous/anxious.   All other systems reviewed and are negative.      Objective:    BP 126/88 (BP Location: Left Arm, Patient Position: Sitting, Cuff Size: Normal)   Pulse 81   Temp 97.7 F (36.5 C) (Oral)   Ht 5' 3.5" (1.613 m)   Wt 193 lb 9.6 oz (87.8 kg)   SpO2 97%   BMI 33.76 kg/m    Physical Exam   General:  Well-developed,well-nourished,in no acute distress; alert,appropriate and cooperative throughout examination Head:  normocephalic and atraumatic.   Eyes:  vision grossly intact, PERRL Ears:  R ear normal and L ear normal externally, TMs clear bilaterally Nose:  no external deformity.   Mouth:  good dentition.   Neck:  No deformities, masses, or tenderness noted. Breasts:  No mass, nodules, thickening, tenderness, bulging, retraction, inflamation, nipple discharge or skin changes noted.   Lungs:  Normal respiratory effort, chest expands symmetrically. Lungs are clear to auscultation, no crackles or wheezes. Heart:  Normal rate and regular rhythm. S1 and S2 normal without gallop, murmur, click, rub or other extra sounds. Abdomen:  Bowel sounds positive,abdomen soft and non-tender without masses, organomegaly or hernias noted. Rectal:  no external abnormalities.   Genitalia:  Pelvic Exam:        External: normal female genitalia without lesions or masses        Vagina: normal without lesions or masses        Cervix: normal  without lesions or masses        Adnexa: normal bimanual exam without masses or fullness        Uterus: normal by palpation        Pap smear: performed Msk:  No deformity or scoliosis noted of thoracic or lumbar spine.   Extremities:  No clubbing, cyanosis, edema, or deformity noted with normal full range of motion of all joints.   Neurologic:  alert & oriented X3 and gait normal.   Skin:  Intact without suspicious lesions or rashes Cervical Nodes:  No lymphadenopathy noted Axillary Nodes:  No palpable lymphadenopathy Psych:  Cognition and judgment appear intact. Alert and cooperative with normal attention span and concentration. No apparent delusions, illusions, hallucinations       Assessment & Plan:   Well woman exam with routine gynecological exam - Plan: Cytology - PAP  Vaginal discharge - Plan: Cytology - PAP No follow-ups on file.

## 2018-10-14 NOTE — Addendum Note (Signed)
Addended by: Varney BilesWIESNER, Sender Rueb M on: 10/14/2018 11:30 AM   Modules accepted: Orders

## 2018-10-14 NOTE — Assessment & Plan Note (Signed)
On estrogen and progesterone.

## 2018-10-14 NOTE — Addendum Note (Signed)
Addended by: Arva ChafeGARCIA, Kelsey Durflinger M on: 10/14/2018 11:15 AM   Modules accepted: Orders

## 2018-10-14 NOTE — Assessment & Plan Note (Signed)
Reviewed preventive care protocols, scheduled due services, and updated immunizations Discussed nutrition, exercise, diet, and healthy lifestyle.  

## 2018-10-14 NOTE — Assessment & Plan Note (Signed)
Refer to GI for further work up.

## 2018-10-14 NOTE — Assessment & Plan Note (Signed)
She feels these symptoms have dramatically approved since she stopped having periods.

## 2018-10-14 NOTE — Assessment & Plan Note (Signed)
Improved.  No changes made today- mainly situational, social situations.

## 2018-10-15 ENCOUNTER — Other Ambulatory Visit: Payer: Self-pay

## 2018-10-15 ENCOUNTER — Other Ambulatory Visit: Payer: Self-pay | Admitting: Family Medicine

## 2018-10-15 ENCOUNTER — Encounter: Payer: Self-pay | Admitting: Internal Medicine

## 2018-10-15 DIAGNOSIS — E781 Pure hyperglyceridemia: Secondary | ICD-10-CM

## 2018-10-15 DIAGNOSIS — E559 Vitamin D deficiency, unspecified: Secondary | ICD-10-CM

## 2018-10-15 LAB — CYTOLOGY - PAP
Adequacy: ABSENT
BACTERIAL VAGINITIS: NEGATIVE
Candida vaginitis: NEGATIVE
DIAGNOSIS: NEGATIVE
HPV: NOT DETECTED

## 2018-10-15 MED ORDER — FENOFIBRATE 54 MG PO TABS: 54.0000 mg | ORAL_TABLET | Freq: Every day | ORAL | 3 refills | Status: DC

## 2018-10-15 MED ORDER — VITAMIN D (ERGOCALCIFEROL) 1.25 MG (50000 UNIT) PO CAPS: 50000.0000 [IU] | ORAL_CAPSULE | ORAL | 0 refills | Status: DC

## 2018-10-15 NOTE — Progress Notes (Signed)
Margaret Wood is very low- Vit D3 50,000 units x 12 weeks - 1 tab po weekly x 12 weeks  then continue VitD 2000 IU daily. Recheck in 2-3 months./sent in Rx

## 2018-10-22 LAB — NICOTINE/COTININE METABOLITES
Cotinine: NOT DETECTED ng/mL
Nicotine: NOT DETECTED ng/mL

## 2018-10-29 DIAGNOSIS — Z1211 Encounter for screening for malignant neoplasm of colon: Secondary | ICD-10-CM | POA: Diagnosis not present

## 2018-11-05 LAB — COLOGUARD: Cologuard: NEGATIVE

## 2018-11-11 ENCOUNTER — Ambulatory Visit: Payer: Self-pay | Admitting: Internal Medicine

## 2018-11-12 ENCOUNTER — Encounter: Payer: Self-pay | Admitting: Family Medicine

## 2018-11-12 ENCOUNTER — Ambulatory Visit
Admission: RE | Admit: 2018-11-12 | Discharge: 2018-11-12 | Disposition: A | Discharge status: Home / Self Care | Payer: 59 | Source: Ambulatory Visit | Attending: Family Medicine | Admitting: Family Medicine

## 2018-11-12 DIAGNOSIS — Z1231 Encounter for screening mammogram for malignant neoplasm of breast: Secondary | ICD-10-CM | POA: Diagnosis not present

## 2018-11-12 DIAGNOSIS — Z1239 Encounter for other screening for malignant neoplasm of breast: Secondary | ICD-10-CM | POA: Diagnosis not present

## 2018-11-19 ENCOUNTER — Telehealth: Payer: Self-pay

## 2018-11-19 NOTE — Telephone Encounter (Signed)
-----   Message from Tamsen Roersennis E Chrismon, [place] sent at 11/18/2018  5:15 PM EST ----- Negative Cologuard. Repeat test in 3 years.

## 2018-11-19 NOTE — Telephone Encounter (Signed)
LMTCB-Patient is establish with [facility].

## 2018-11-22 ENCOUNTER — Encounter: Payer: Self-pay | Admitting: Family Medicine

## 2018-11-26 MED ORDER — PROGESTERONE MICRONIZED 100 MG PO CAPS: 100.0000 mg | ORAL_CAPSULE | Freq: Every day | ORAL | 1 refills | Status: DC

## 2018-11-26 MED ORDER — ESTRADIOL 0.05 MG/24HR TD PTTW: 1.0000 | MEDICATED_PATCH | TRANSDERMAL | 1 refills | Status: DC

## 2018-11-26 MED ORDER — METOPROLOL TARTRATE 25 MG PO TABS: 25.0000 mg | ORAL_TABLET | Freq: Two times a day (BID) | ORAL | 1 refills | Status: DC

## 2018-12-03 ENCOUNTER — Ambulatory Visit (INDEPENDENT_AMBULATORY_CARE_PROVIDER_SITE_OTHER): Payer: 59 | Admitting: Internal Medicine

## 2018-12-03 ENCOUNTER — Encounter: Payer: Self-pay | Admitting: Internal Medicine

## 2018-12-03 VITALS — BP 122/90 | HR 88 | Ht 63.25 in | Wt 189.0 lb

## 2018-12-03 DIAGNOSIS — K219 Gastro-esophageal reflux disease without esophagitis: Secondary | ICD-10-CM

## 2018-12-03 DIAGNOSIS — R1011 Right upper quadrant pain: Secondary | ICD-10-CM | POA: Diagnosis not present

## 2018-12-03 DIAGNOSIS — K582 Mixed irritable bowel syndrome: Secondary | ICD-10-CM

## 2018-12-03 DIAGNOSIS — R112 Nausea with vomiting, unspecified: Secondary | ICD-10-CM | POA: Diagnosis not present

## 2018-12-03 MED ORDER — PANTOPRAZOLE SODIUM 40 MG PO TBEC: 40.0000 mg | DELAYED_RELEASE_TABLET | Freq: Every day | ORAL | 3 refills | Status: DC | PRN

## 2018-12-03 NOTE — Patient Instructions (Signed)
We have sent the following medications to your pharmacy for you to pick up at your convenience:  Pantoprazole  You have been scheduled for an endoscopy. Please follow written instructions given to you at your visit today. If you use inhalers (even only as needed), please bring them with you on the day of your procedure. Your physician has requested that you go to www.startemmi.com and enter the access code given to you at your visit today. This web site gives a general overview about your procedure. However, you should still follow specific instructions given to you by our office regarding your preparation for the procedure.   

## 2018-12-03 NOTE — Progress Notes (Signed)
HISTORY OF PRESENT ILLNESS:  Margaret Wood is a 58 y.o. female, native of [place], with a history of anxiety/depression, fibromyalgia, irritable bowel syndrome, and prior cholecystectomy who was sent today by her primary care provider Dr. Dayton Martes with chief complaints of intermittent nausea with vomiting, chronic abdominal pain, and irritable bowel syndrome.  The patient is accompanied by her husband.  First, she reports a 3-year history of intermittent problems with nausea and vomiting, often necessitating an emergency room visit for IV antiemetics, coincident with a tick bite.  Patient tells me that she will have 3 or 4 episodes per year.  They last no more than 1 day.  After antiemetics are effective and she sleeps for an extended period of time, she feels well.  No problems in the interim.  One episode was associated with vertigo.  She is known to have positional vertigo.  No other associated factors..  She does have chronic intermittent reflux symptoms which she treats sporadically with PPI.  She did undergo colonoscopy approximately 10 years ago which was reported to be negative.  Recent Cologuard testing was negative.  She does have alternating diarrhea and constipation as well as abdominal bloating.  She also reports intermittent problems with abdominal pain, particularly in the right upper quadrant.  She is status post cholecystectomy remotely.  She does not use cannabis.  She has been on Ultram for some time.  She does use Aleve regularly.  No bleeding or weight loss.  Review of outside laboratories from November 2019 finds normal comprehensive metabolic panel and CBC with hemoglobin 13.3.  Review of outside x-rays include a CT scan of the abdomen with contrast September 2009 to evaluate abdominal pain with diarrhea.  Question mild colitis.  Had colonoscopy at that time.  Plain films of the abdomen to evaluate right upper quadrant pain 2016 was negative most recent emergency room  evaluation for abdominal pain with nausea and vomiting September 30, 2008.  Treated symptomatically.  Told to follow-up with GI.  REVIEW OF SYSTEMS:  All non-GI ROS negative unless otherwise stated in the HPI except for anxiety, cough, fatigue, itching, shortness of breath, sleeping problems, sore throat,  Past Medical History:  Diagnosis Date  . Anxiety   . Arthritis   . Depression   . Fibromyalgia   . Gallstones   . H/O [facility] spotted fever   . IBS (irritable bowel syndrome)   . Mitral valve prolapse   . Palpitations   . Scoliosis   . Serotonin syndrome     Past Surgical History:  Procedure Laterality Date  . APPENDECTOMY  1980  . CESAREAN SECTION  1995  . CHOLECYSTECTOMY  1997    Social History [name]  reports that she has never smoked. She has never used smokeless tobacco. She reports previous alcohol use. She reports that she does not use drugs.  family history includes Alcohol abuse in her brother and sister; Anxiety disorder in her mother and sister; Arrhythmia in her paternal uncle; Brain cancer in her father, paternal aunt, and paternal grandfather; Depression in her mother; Diabetes in her brother; Diverticulitis in her brother; Healthy in her brother; Heart disease in her brother; Lung cancer in her maternal grandfather and maternal grandmother; Osteoarthritis in her mother.  Allergies  Allergen Reactions  . Amoxicillin Nausea Only  . Cymbalta [Duloxetine Hcl]     SOB, palpitations, nausea  . Other     Cannot take anything extended release per pt  . Tegretol [  Carbamazepine]     Made her ill       PHYSICAL EXAMINATION: Vital signs: BP 122/90 (BP Location: Left Arm, Patient Position: Sitting, Cuff Size: Normal)   Pulse 88   Ht 5' 3.25" (1.607 m) Comment: height measured without shoes  Wt 189 lb (85.7 kg)   LMP  (LMP Unknown) Comment: 6 MONTHS AGO PER PT  BMI 33.22 kg/m   Constitutional: generally well-appearing, no acute  distress Psychiatric: alert and oriented x3, cooperative Eyes: extraocular movements intact, anicteric, conjunctiva pink Mouth: oral pharynx moist, no lesions Neck: supple no lymphadenopathy Cardiovascular: heart regular rate and rhythm, no murmur Lungs: clear to auscultation bilaterally Abdomen: Superficial tenderness in various points consistent with fibromyalgia.  Otherwise soft, nontender, nondistended, no obvious ascites, no peritoneal signs, normal bowel sounds, no organomegaly Rectal: Omitted Extremities: no clubbing, cyanosis, or lower extremity edema bilaterally Skin: no lesions on visible extremities Neuro: No focal deficits.  Cranial nerves intact  ASSESSMENT:  1.  Cyclic nausea with vomiting.  She is status post cholecystectomy.  Recent ER evaluation negative.  Possible GI etiologies include poorly controlled GERD, ulcer disease from NSAIDs, or gastroparesis.  Other etiologies include central with history of vertigo and functional.  No worrisome features.  Nonprogressive. 2.  History of irritable bowel syndrome 3.  History of anxiety/depression and fibromyalgia 4.  Colon cancer screening up-to-date.  Negative recent Cologuard   PLAN:  1.  Reflux precautions with attention to weight loss 2.  Prescribe pantoprazole 40 mg daily.  Take regularly  3.  Schedule upper endoscopy to evaluate recurrent nausea with vomiting abdominal pain.The nature of the procedure, as well as the risks, benefits, and alternatives were carefully and thoroughly reviewed with the patient. Ample time for discussion and questions allowed. The patient understood, was satisfied, and agreed to proceed. 4.  Colon cancer screening at the direction of her PCP  Copy of this consultation note has been sent to Dr. Dayton Martes

## 2018-12-04 ENCOUNTER — Ambulatory Visit (AMBULATORY_SURGERY_CENTER): Payer: 59 | Admitting: Internal Medicine

## 2018-12-04 ENCOUNTER — Encounter: Payer: Self-pay | Admitting: Internal Medicine

## 2018-12-04 VITALS — BP 133/75 | HR 80 | Temp 98.9°F | Resp 11 | Ht 63.0 in | Wt 189.0 lb

## 2018-12-04 DIAGNOSIS — R1011 Right upper quadrant pain: Secondary | ICD-10-CM | POA: Diagnosis not present

## 2018-12-04 DIAGNOSIS — K219 Gastro-esophageal reflux disease without esophagitis: Secondary | ICD-10-CM | POA: Diagnosis not present

## 2018-12-04 DIAGNOSIS — R112 Nausea with vomiting, unspecified: Secondary | ICD-10-CM | POA: Diagnosis not present

## 2018-12-04 MED ORDER — SODIUM CHLORIDE 0.9 % IV SOLN: 500.0000 mL | Freq: Once | INTRAVENOUS | Status: DC

## 2018-12-04 NOTE — Patient Instructions (Signed)
YOU HAD AN ENDOSCOPIC PROCEDURE TODAY AT THE [facility]:   Refer to the procedure report that was given to you for any specific questions about what was found during the examination.  If the procedure report does not answer your questions, please call your gastroenterologist to clarify.  If you requested that your care partner not be given the details of your procedure findings, then the procedure report has been included in a sealed envelope for you to review at your convenience later.  YOU SHOULD EXPECT: Some feelings of bloating in the abdomen. Passage of more gas than usual.  Walking can help get rid of the air that was put into your GI tract during the procedure and reduce the bloating. If you had a lower endoscopy (such as a colonoscopy or flexible sigmoidoscopy) you may notice spotting of blood in your stool or on the toilet paper. If you underwent a bowel prep for your procedure, you may not have a normal bowel movement for a few days.  Please Note:  You might notice some irritation and congestion in your nose or some drainage.  This is from the oxygen used during your procedure.  There is no need for concern and it should clear up in a day or so.  SYMPTOMS TO REPORT IMMEDIATELY:    Following upper endoscopy (EGD)  Vomiting of blood or coffee ground material  New chest pain or pain under the shoulder blades  Painful or persistently difficult swallowing  New shortness of breath  Fever of 100F or higher  Black, tarry-looking stools  For urgent or emergent issues, a gastroenterologist can be reached at any hour by calling [phone].   DIET:  We do recommend a small meal at first, but then you may proceed to your regular diet.  Drink plenty of fluids but you should avoid alcoholic beverages for 24 hours.  ACTIVITY:  You should plan to take it easy for the rest of today and you should NOT DRIVE or use heavy machinery until tomorrow (because of the sedation medicines used  during the test).    FOLLOW UP: Our staff will call the number listed on your records the next business day following your procedure to check on you and address any questions or concerns that you may have regarding the information given to you following your procedure. If we do not reach you, we will leave a message.  However, if you are feeling well and you are not experiencing any problems, there is no need to return our call.  We will assume that you have returned to your regular daily activities without incident.  If any biopsies were taken you will be contacted by phone or by letter within the next 1-3 weeks.  Please call us at [phone] if you have not heard about the biopsies in 3 weeks.   Office will call you with follow up appointment for office visit in 6 weeks and gastric emptying study  SIGNATURES/CONFIDENTIALITY: You and/or your care partner have signed paperwork which will be entered into your electronic medical record.  These signatures attest to the fact that that the information above on your After Visit Summary has been reviewed and is understood.  Full responsibility of the confidentiality of this discharge information lies with you and/or your care-partner.

## 2018-12-04 NOTE — Progress Notes (Signed)
Pt's states no medical or surgical changes since previsit or office visit. 

## 2018-12-04 NOTE — Progress Notes (Signed)
Report to PACU, RN, vss, BBS= Clear.  

## 2018-12-04 NOTE — Op Note (Signed)
[facility] Patient Name: Margaret ArtMichelle Nigg Procedure Date: 12/04/2018 9:08 AM MRN: [id number] Endoscopist: Wilhemina BonitoJohn N. Marina GoodellPerry , MD Age: 58 Referring MD:  Date of Birth: [birth year] Gender: Female Account #: [id number] Procedure:                Upper GI endoscopy Indications:              Abdominal pain, Esophageal reflux, Nausea with                            vomiting Medicines:                Monitored Anesthesia Care Procedure:                Pre-Anesthesia Assessment:                           - Prior to the procedure, a History and Physical                            was performed, and patient medications and                            allergies were reviewed. The patient's tolerance of                            previous anesthesia was also reviewed. The risks                            and benefits of the procedure and the sedation                            options and risks were discussed with the patient.                            All questions were answered, and informed consent                            was obtained. Prior Anticoagulants: The patient has                            taken no previous anticoagulant or antiplatelet                            agents. ASA Grade Assessment: II - A patient with                            mild systemic disease. After reviewing the risks                            and benefits, the patient was deemed in                            satisfactory condition to undergo the procedure.  After obtaining informed consent, the endoscope was                            passed under direct vision. Throughout the                            procedure, the patient's blood pressure, pulse, and                            oxygen saturations were monitored continuously. The                            Model GIF-[id number]) scope was introduced                            through the mouth, and advanced to the second  part                            of duodenum. The upper GI endoscopy was                            accomplished without difficulty. The patient                            tolerated the procedure well. Scope In: Scope Out: Findings:                 The esophagus was normal.                           A large amount of food (residue) was found in the                            entire examined stomach.                           The examined duodenum was normal.                           The cardia and gastric fundus were normal on                            retroflexion. Complications:            No immediate complications. Estimated Blood Loss:     Estimated blood loss: none. Impression:               - Normal esophagus.                           - A large amount of food (residue) in the stomach.                            Suspect medication induced gastroparesis versus                            primary idiopathic.                           -  Normal examined duodenum.                           - No specimens collected. Recommendation:           - Patient has a contact number available for                            emergencies. The signs and symptoms of potential                            delayed complications were discussed with the                            patient. Return to normal activities tomorrow.                            Written discharge instructions were provided to the                            patient.                           - Resume previous diet.                           - Continue present medications, including recently                            prescribed pantoprazole.                           - Schedule solid-phase gastric emptying scan "rule                            out gastroparesis"                           - Office follow-up with Dr. Marina Goodell in 6 weeks Wilhemina Bonito. Marina Goodell, MD 12/04/2018 9:25:12 AM This report has been signed electronically.

## 2018-12-05 ENCOUNTER — Telehealth: Payer: Self-pay

## 2018-12-05 NOTE — Telephone Encounter (Signed)
Called [phone] and left a messaged we tried to reach pt for a follow up call. maw

## 2018-12-05 NOTE — Telephone Encounter (Signed)
Attempted to reach patient for post-procedure f/u call. No answer. This is the 2nd call. Left message for her to please call us if she has any questions/concerns regarding her care.

## 2018-12-10 ENCOUNTER — Other Ambulatory Visit: Payer: Self-pay

## 2018-12-10 DIAGNOSIS — R112 Nausea with vomiting, unspecified: Secondary | ICD-10-CM

## 2018-12-10 DIAGNOSIS — K219 Gastro-esophageal reflux disease without esophagitis: Secondary | ICD-10-CM

## 2018-12-10 DIAGNOSIS — R1011 Right upper quadrant pain: Secondary | ICD-10-CM

## 2018-12-11 ENCOUNTER — Telehealth: Payer: Self-pay | Admitting: Internal Medicine

## 2018-12-11 NOTE — Progress Notes (Signed)
A letter was sent to the patient concerning the solid phase gastric emptying can that has been scheduled for the patient and the follow up office visit that has also been scheduled; a copy of the letter has also been sent via MyChart to the patient;

## 2018-12-11 NOTE — Telephone Encounter (Signed)
GES rescheduled in [place] at [facility] 12/27/18@9 :30am, pt to arrive there at 9:15am. Pt to hold stomach meds for 8 hours prior to test and be NPO after midnight. Pt aware.

## 2018-12-11 NOTE — Telephone Encounter (Signed)
Patient states someone called yesterday to schedule gastric emptying. Patient requesting cb to discuss and to see if it could possibly be done in [place].

## 2018-12-24 ENCOUNTER — Encounter (HOSPITAL_COMMUNITY): Payer: 59

## 2018-12-27 ENCOUNTER — Ambulatory Visit
Admission: RE | Admit: 2018-12-27 | Discharge: 2018-12-27 | Disposition: A | Discharge status: Home / Self Care | Payer: 59 | Source: Ambulatory Visit | Attending: Internal Medicine | Admitting: Internal Medicine

## 2018-12-27 ENCOUNTER — Telehealth: Payer: Self-pay | Admitting: Internal Medicine

## 2018-12-27 DIAGNOSIS — K219 Gastro-esophageal reflux disease without esophagitis: Secondary | ICD-10-CM

## 2018-12-27 DIAGNOSIS — K210 Gastro-esophageal reflux disease with esophagitis: Secondary | ICD-10-CM | POA: Diagnosis not present

## 2018-12-27 DIAGNOSIS — R1010 Upper abdominal pain, unspecified: Secondary | ICD-10-CM | POA: Diagnosis not present

## 2018-12-27 DIAGNOSIS — R1011 Right upper quadrant pain: Secondary | ICD-10-CM | POA: Insufficient documentation

## 2018-12-27 DIAGNOSIS — R112 Nausea with vomiting, unspecified: Secondary | ICD-10-CM | POA: Diagnosis not present

## 2018-12-27 DIAGNOSIS — R1013 Epigastric pain: Secondary | ICD-10-CM | POA: Diagnosis not present

## 2018-12-27 MED ORDER — TECHNETIUM TC 99M SULFUR COLLOID
2.0000 | Freq: Once | INTRAVENOUS | Status: AC | PRN
  Administered 2018-12-27: 2.17 via INTRAVENOUS

## 2018-12-27 NOTE — Telephone Encounter (Signed)
Verbal order given over the phone to proceed with scan.

## 2018-12-27 NOTE — Telephone Encounter (Signed)
Kelly call wanting to get a verbal authorization for the pt procedure since it has not been sign off by the doctor. She can be reached at [phone] and advised anyone that answer can assist with the request

## 2018-12-31 ENCOUNTER — Other Ambulatory Visit: Payer: Self-pay

## 2019-01-22 ENCOUNTER — Ambulatory Visit: Payer: Self-pay | Admitting: Internal Medicine

## 2019-02-02 ENCOUNTER — Other Ambulatory Visit: Payer: Self-pay | Admitting: Family Medicine

## 2019-03-09 ENCOUNTER — Other Ambulatory Visit: Payer: Self-pay | Admitting: Family Medicine

## 2019-04-02 ENCOUNTER — Encounter: Payer: Self-pay | Admitting: Internal Medicine

## 2019-04-03 ENCOUNTER — Other Ambulatory Visit: Payer: Self-pay | Admitting: Family Medicine

## 2019-04-14 ENCOUNTER — Other Ambulatory Visit: Payer: Self-pay | Admitting: Family Medicine

## 2019-04-15 ENCOUNTER — Encounter: Payer: Self-pay | Admitting: Family Medicine

## 2019-04-15 ENCOUNTER — Other Ambulatory Visit: Payer: Self-pay | Admitting: Family Medicine

## 2019-04-15 NOTE — Telephone Encounter (Signed)
TA-LOV: 11.18.2019/CSC & UDS are UTD/per [place] PMP pt is UTD/plz advise/thx dmf 

## 2019-04-15 NOTE — Telephone Encounter (Signed)
Patient called stating the pharmacy is stating her medication was denied when it looks like PCP approved and it was sent.  Patient called crying regarding this.  Please advise.  Patient call back #[phone]

## 2019-04-15 NOTE — Telephone Encounter (Signed)
TA-LOV: 11.18.2019/CSC & UDS are UTD/per [place] PMP pt is UTD/plz advise/thx dmf

## 2019-04-16 ENCOUNTER — Other Ambulatory Visit: Payer: Self-pay

## 2019-04-16 MED ORDER — ALPRAZOLAM 0.25 MG PO TABS: ORAL_TABLET | ORAL | 1 refills | Status: DC

## 2019-04-16 MED ORDER — TRAMADOL HCL 50 MG PO TABS: ORAL_TABLET | ORAL | 1 refills | Status: DC

## 2019-04-16 NOTE — Progress Notes (Unsigned)
TA-Can you please resend these/they were set to print yesterday so patient was told that we denied it/I have them prepared and pended/thx dmf

## 2019-04-17 NOTE — Progress Notes (Unsigned)
For whatever reason, it still wont let me send these electronically.

## 2019-04-18 NOTE — Progress Notes (Signed)
Called [employer], Spoke to Maralyn Sago, gave Verbal orders of the Xanax 0.25mg  tab,QT#45,R1 and Ultram 50mg  , QY#90, R1. Task completed.

## 2019-05-17 ENCOUNTER — Other Ambulatory Visit: Payer: Self-pay | Admitting: Family Medicine

## 2019-08-05 ENCOUNTER — Encounter: Payer: Self-pay | Admitting: Family Medicine

## 2019-08-08 ENCOUNTER — Other Ambulatory Visit: Payer: Self-pay | Admitting: Family Medicine

## 2019-08-08 DIAGNOSIS — L989 Disorder of the skin and subcutaneous tissue, unspecified: Secondary | ICD-10-CM

## 2019-08-18 ENCOUNTER — Other Ambulatory Visit: Payer: Self-pay | Admitting: Family Medicine

## 2019-08-19 ENCOUNTER — Encounter: Payer: Self-pay | Admitting: Family Medicine

## 2019-08-20 NOTE — Telephone Encounter (Signed)
Pt's husband stated the pharmacy told him they still have not received rx for alprazolam and trazadone. Pt just spoke with pharmacy before calling office. Please advise. [id number] - [address], [place] [phone] (Phone) [phone] (Fax)

## 2019-08-20 NOTE — Addendum Note (Signed)
Addended by: Rodrigo Ran on: 08/20/2019 04:11 PM   Modules accepted: Orders

## 2019-08-21 MED ORDER — TRAMADOL HCL 50 MG PO TABS: ORAL_TABLET | ORAL | 0 refills | Status: DC

## 2019-08-21 MED ORDER — ALPRAZOLAM 0.25 MG PO TABS: ORAL_TABLET | ORAL | 0 refills | Status: DC

## 2019-09-22 ENCOUNTER — Other Ambulatory Visit: Payer: Self-pay

## 2019-09-22 DIAGNOSIS — Z20822 Contact with and (suspected) exposure to covid-19: Secondary | ICD-10-CM

## 2019-09-23 LAB — NOVEL CORONAVIRUS, NAA: SARS-CoV-2, NAA: NOT DETECTED

## 2019-10-11 ENCOUNTER — Other Ambulatory Visit: Payer: Self-pay | Admitting: Family Medicine

## 2019-10-30 ENCOUNTER — Other Ambulatory Visit: Payer: Self-pay | Admitting: Family Medicine

## 2019-11-05 DIAGNOSIS — F1190 Opioid use, unspecified, uncomplicated: Secondary | ICD-10-CM | POA: Insufficient documentation

## 2019-11-05 NOTE — Assessment & Plan Note (Addendum)
She feels anxiety is mostly due to Covid, situational but she is coping well.   I advised her of the following: You have decided to try a medication that is a controlled substance for treatment of your anxiety.  While this medication has benefits and can help you with your illness, as we discussed, it also has considerable risks. You have been properly informed of the risks and alternatives and re-iterate here that this medication can cause:  Altered Mental Capacity or Alertness: Do not drive or operate machinery while taking this medication  Dependence: A physiological state that can occur with regular drug use and results in withdrawal symptoms when drug use is abruptly discontinued. This can happen even with a short course of this medication.  Addiction: A chronic, relapsing disease characterized by compulsive drug-seeking and use despite negative consequences and by long-lasting changes in the brain.  Many, many more side effects including some that can be serious and/or life threatening. Please discuss these side effects with your pharmacist.   -that you use as little of this medication as possible and only as directed for as short of time as possible -keep this medication in a safe and locked location away from children or others -do not share this medication -dispose of any unused medication in a medication drop box -do not take this medication with other controlled or sedating substances, alcohol or drugs other then those approved by your doctor   How to help anxiety - without medication.   1) Regular Exercise - walking, jogging, cycling, dancing, strength training --> Yoga has been shown in research to reduce depression and anxiety -- with even just one hour long session per week  2)  Begin a Mindfulness/Meditation practice -- this can take a little as 3 minutes and is helpful for all kinds of mood issues -- You can find resources in books -- Or you can download apps like   ---- Headspace App (which currently has free content called "Weathering the Storm") ---- Calm (which has a few free options)  ---- Insignt Timer ---- Stop, Breathe & Think  # With each of these Apps - you should decline the "start free trial" offer and as you search through the App should be able to access some of their free content. You can also chose to pay for the content if you find one that works well for you.   # Many of them also offer sleep specific content which may help with insomnia  3) Healthy Diet -- Avoid or decrease Caffeine -- Avoid or decrease Alcohol -- Drink plenty of water, have a balanced diet -- Avoid cigarettes and marijuana (as well as other recreational drugs)  4) Consider contacting a professional therapist  -- [facility] is one option. Call [phone] -- Or you can check out www.psychologytoday.com -- you can read bios of therapists and see if they accept insurance -- Check with your insurance to see if you have coverage and who may take your insurance

## 2019-11-05 NOTE — Assessment & Plan Note (Addendum)
Indication for chronic opioid: fibromyalgia Medication and dose: Tramadol 50 mg- 1 tab by mouth three times daily as needed # pills per month: 90 Last UDS date: 09/19/2018 Opioid Treatment Agreement signed (Y/N): Y Opioid Treatment Agreement last reviewed with patient:  11/05/19 NCCSRS reviewed this encounter (include red flags):  reviewed- no red flags  She will updated UDS/CSC -scheduled for 12/14 @ 3 pm .

## 2019-11-05 NOTE — Assessment & Plan Note (Addendum)
Has failed previous SSRI- see under opioid dependence.  Cymbalta helped but gave her serotonin syndrome. Lately, has had to take tramadol twice daily. PDMP reviewed- no red flags. eRX refills sent.

## 2019-11-05 NOTE — Progress Notes (Signed)
Virtual Visit via Video   Due to the COVID-19 pandemic, this visit was completed with telemedicine (audio/video) technology to reduce patient and provider exposure as well as to preserve personal protective equipment.   I connected with Margaret Wood by a video enabled telemedicine application and verified that I am speaking with the correct person using two identifiers. Location patient: Home Location provider: [facility], Office Persons participating in the virtual visit: Ellender Hose, MD   I discussed the limitations of evaluation and management by telemedicine and the availability of in person appointments. The patient expressed understanding and agreed to proceed.  Interactive audio and video telecommunications were attempted between this provider and patient, however failed, due to patient having technical difficulties OR patient did not have access to video capability.  We continued and completed visit with audio only.   Care Team   Patient Care Team: Dianne Dun, MD as PCP - General (Family Medicine) Antonieta Iba, MD as Consulting Physician (Cardiology)  Subjective:   HPI: Pt is connecting for medication management.  Fibromyalgia-Allergic to Lyrica and Cymbalta- causes extreme sedation. Pain is mainly in her shoulders and hipsand has unfortunately.  Tramadol helps when pain is severe.   GAD/MDD-was followed by psychiatry,Dr. Elna Breslow. Sheprescribed Remeron in 03/2018, which caused heart palpitations. She D/C'dthis medication after one day, and restarted her alprazolam. Has multiple drug intolerances- remeron was the most recent intolerance.  No longerseeing psychiatrist.Takingxanax0.25mg  1-2 times daily prn. Does have panic attack symptoms triggered by social situations.Uses it maybe 4 times per week. It depends on what she had going on this week. Has not had any suicidal thoughts since she stopped having periods.  She feels  anxiety is mostly due to Covid, situational but she is coping well.    Per [place] PMP is compliant without red flags.  She is needing to update her CSC & UDS and may schedule a lab visit for this.   Depression screen [facility] 2/9 11/05/2019 10/14/2018 03/12/2018  Decreased Interest 1 1 1   Down, Depressed, Hopeless 1 1 1   PHQ - 2 Score 2 2 2   Altered sleeping 1 1 3   Tired, decreased energy 2 2 3   Change in appetite 0 1 2  Feeling bad or failure about yourself  0 2 2  Trouble concentrating 1 1 3   Moving slowly or fidgety/restless 1 1 2   Suicidal thoughts 0 0 0  PHQ-9 Score 7 10 17   Difficult doing work/chores Somewhat difficult Somewhat difficult Very difficult   GAD 7 : Generalized Anxiety Score 11/05/2019 10/14/2018 03/12/2018  Nervous, Anxious, on Edge 2 2 3   Control/stop worrying 1 1 1   Worry too much - different things 1 1 2   Trouble relaxing 2 2 3   Restless 1 1 3   Easily annoyed or irritable 2 2 3   Afraid - awful might happen 1 2 3   Total GAD 7 Score 10 11 18   Anxiety Difficulty Somewhat difficult Somewhat difficult Extremely difficult      Review of Systems  Constitutional: Negative for chills and fever.  HENT: Negative for congestion and hearing loss.   Eyes: Negative for discharge and redness.  Respiratory: Negative for cough and shortness of breath.   Cardiovascular: Negative for chest pain.  Gastrointestinal: Negative for abdominal pain and heartburn.  Genitourinary: Negative for dysuria.  Musculoskeletal: Negative for falls and myalgias.  Skin: Negative for rash.  Neurological: Negative for dizziness and loss of consciousness.  Endo/Heme/Allergies: Does not bruise/bleed easily.  Psychiatric/Behavioral: Negative for depression and memory loss. The patient is nervous/anxious.   All other systems reviewed and are negative.    Patient Active Problem List   Diagnosis Date Noted   Chronic narcotic use 11/05/2019   Well woman exam with routine gynecological exam 10/14/2018     Cyclical vomiting 10/14/2018   Postmenopausal HRT (hormone replacement therapy) 05/03/2015   MDD (major depressive disorder), recurrent severe, without psychosis (HCC) 07/24/2013   GAD (generalized anxiety disorder) 07/24/2013   External hemorrhoid, bleeding 01/22/2013   Fibromyalgia    Scoliosis    UNSPECIFIED ARTHROPATHY MULTIPLE SITES 04/27/2008    Social History   Tobacco Use   Smoking status: Never Smoker   Smokeless tobacco: Never Used  Substance Use Topics   Alcohol use: Not Currently    Current Outpatient Medications:    ALPRAZolam (XANAX) 0.25 MG tablet, TAKE 1 TABLET BY MOUTH TWICE DAILY AS NEEDED FOR ANXITEY, Disp: 45 tablet, Rfl: 0   cyclobenzaprine (FLEXERIL) 10 MG tablet, Take 1 tablet (10 mg total) by mouth at bedtime., Disp: 90 tablet, Rfl: 1   dicyclomine (BENTYL) 10 MG capsule, TAKE 1 CAPSULE(10 MG) BY MOUTH EVERY 6 HOURS AS NEEDED, Disp: 90 capsule, Rfl: 2   hydrocortisone (ANUSOL-HC) 25 MG suppository, UNWRAP AND INSERT 1 SUPPOSITORY (25 MG TOTAL) RECTALLY 2 (TWO) TIMES DAILY., Disp: 12 suppository, Rfl: 0   ibuprofen (ADVIL,MOTRIN) 600 MG tablet, Take 1 tablet (600 mg total) every 6 (six) hours as needed by mouth., Disp: 30 tablet, Rfl: 0   metoprolol tartrate (LOPRESSOR) 25 MG tablet, TAKE 1 TABLET BY MOUTH  TWICE DAILY, Disp: 60 tablet, Rfl: 0   ondansetron (ZOFRAN ODT) 4 MG disintegrating tablet, Take 1 tablet (4 mg total) by mouth every 8 (eight) hours as needed for nausea or vomiting., Disp: 20 tablet, Rfl: 0   pantoprazole (PROTONIX) 40 MG tablet, Take 1 tablet (40 mg total) by mouth daily as needed. For GERD., Disp: 90 tablet, Rfl: 3   progesterone (PROMETRIUM) 100 MG capsule, TAKE 1 CAPSULE BY MOUTH  DAILY, Disp: 30 capsule, Rfl: 0   promethazine (PHENERGAN) 12.5 MG tablet, TAKE 1 TABLET(12.5 MG) BY MOUTH EVERY 6 HOURS AS NEEDED FOR NAUSEA OR VOMITING, Disp: 30 tablet, Rfl: 0   promethazine (PHENERGAN) 25 MG suppository, Place 1  suppository (25 mg total) rectally every 6 (six) hours as needed for nausea., Disp: 12 suppository, Rfl: 1   traMADol (ULTRAM) 50 MG tablet, TAKE 1 TABLET BY MOUTH EVERY 8 HOURS, Disp: 90 tablet, Rfl: 0   VIVELLE-DOT 0.05 MG/24HR patch, APPLY 1 PATCH TOPICALLY TO  SKIN TWICE WEEKLY, Disp: 24 patch, Rfl: 0  Current Facility-Administered Medications:    0.9 %  sodium chloride infusion, 500 mL, Intravenous, Once, Hilarie FredricksonPerry, John N, MD  Allergies  Allergen Reactions   Amoxicillin Nausea Only   Cymbalta [Duloxetine Hcl]     SOB, palpitations, nausea   Other     Cannot take anything extended release per pt   Tegretol [Carbamazepine]     Made her ill    Objective:  BP 123/80    Temp 97.6 F (36.4 C)    Ht 5' 3.5" (1.613 m)    Wt 185 lb (83.9 kg)    BMI 32.26 kg/m   VITALS: Per patient if applicable, see vitals. GENERAL: Alert, appears well and in no acute distress. HEENT: Atraumatic, conjunctiva clear, no obvious abnormalities on inspection of external nose and ears. NECK: Normal movements of the head and neck. CARDIOPULMONARY: No increased  WOB. Speaking in clear sentences. I:E ratio WNL.  MS: Moves all visible extremities without noticeable abnormality. PSYCH: Pleasant and cooperative, well-groomed. Speech normal rate and rhythm. Affect is appropriate. Insight and judgement are appropriate. Attention is focused, linear, and appropriate.  NEURO: CN grossly intact. Oriented as arrived to appointment on time with no prompting. Moves both UE equally.  SKIN: No obvious lesions, wounds, erythema, or cyanosis noted on face or hands.  Depression screen [facility] 2/9 11/05/2019 10/14/2018 03/12/2018  Decreased Interest Down, Depressed, Hopeless PHQ - 2 Score Altered sleeping Tired, decreased energy Change in appetite 0 1 2  Feeling bad or failure about yourself  0 2 2  Trouble concentrating Moving slowly or fidgety/restless Suicidal thoughts 0 0  0  PHQ-9 Score Difficult doing work/chores Somewhat difficult Somewhat difficult Very difficult      COVID-19 Education: The signs and symptoms of COVID-19 were discussed with the patient and how to seek care for testing if needed. The importance of social distancing was discussed today.  Reviewed expectations re: course of current medical issues.  Discussed self-management of symptoms.  Outlined signs and symptoms indicating need for more acute intervention.  Patient verbalized understanding and all questions were answered.  Health Maintenance issues including appropriate healthy diet, exercise, and smoking avoidance were discussed with patient.  See orders for this visit as documented in the electronic medical record.   Records requested if needed. Time spent: 25 minutes, of which >50% was spent in obtaining information about her symptoms, reviewing her previous labs, evaluations, and treatments, counseling her about her condition (please see the discussed topics above), and developing a plan to further investigate it; she had a number of questions which I addressed.   Lab Results  Component Value Date   WBC 6.6 10/14/2018   HGB 13.3 10/14/2018   HCT 39.3 10/14/2018   PLT 271.0 10/14/2018   GLUCOSE 86 10/14/2018   CHOL 212 (H) 10/14/2018   TRIG (H) 10/14/2018    628.0 Triglyceride is over 400; calculations on Lipids are invalid.   HDL 33.40 (L) 10/14/2018   LDLDIRECT 77.0 10/14/2018   ALT 10 10/14/2018   AST 15 10/14/2018   NA 139 10/14/2018   K 4.2 10/14/2018   CL 101 10/14/2018   CREATININE 1.01 10/14/2018   BUN 15 10/14/2018   CO2 29 10/14/2018   TSH 3.45 10/14/2018   HGBA1C 5.1 02/23/2014    Lab Results  Component Value Date   TSH 3.45 10/14/2018   Lab Results  Component Value Date   WBC 6.6 10/14/2018   HGB 13.3 10/14/2018   HCT 39.3 10/14/2018   MCV 89.5 10/14/2018   PLT 271.0 10/14/2018   Lab Results  Component Value Date   NA 139  10/14/2018   K 4.2 10/14/2018   CO2 29 10/14/2018   GLUCOSE 86 10/14/2018   BUN 15 10/14/2018   CREATININE 1.01 10/14/2018   BILITOT 0.7 10/14/2018   ALKPHOS 62 10/14/2018   AST 15 10/14/2018   ALT 10 10/14/2018   PROT 7.5 10/14/2018   ALBUMIN 4.2 10/14/2018   CALCIUM 10.3 10/14/2018   ANIONGAP 13 09/30/2018   GFR 60.41 10/14/2018   Lab Results  Component Value Date   CHOL 212 (H) 10/14/2018   Lab Results  Component Value Date   HDL 33.40 (  L) 10/14/2018   No results found for: [facility] Lab Results  Component Value Date   TRIG (H) 10/14/2018    628.0 Triglyceride is over 400; calculations on Lipids are invalid.   Lab Results  Component Value Date   CHOLHDL 6 10/14/2018   Lab Results  Component Value Date   HGBA1C 5.1 02/23/2014       Assessment & Plan:   Problem List Items Addressed This Visit      Active Problems   Fibromyalgia    Has failed previous SSRI- see under opioid dependence.  Cymbalta helped but gave her serotonin syndrome. Lately, has had to take tramadol twice daily. PDMP reviewed- no red flags. eRX refills sent.      Relevant Medications   traMADol (ULTRAM) 50 MG tablet   Other Relevant Orders   Pain Mgmt, Profile 8 w/Conf, U   GAD (generalized anxiety disorder)     She feels anxiety is mostly due to Covid, situational but she is coping well.   I advised her of the following: You have decided to try a medication that is a controlled substance for treatment of your anxiety.  While this medication has benefits and can help you with your illness, as we discussed, it also has considerable risks. You have been properly informed of the risks and alternatives and re-iterate here that this medication can cause:  Altered Mental Capacity or Alertness: Do not drive or operate machinery while taking this medication  Dependence: A physiological state that can occur with regular drug use and results in withdrawal symptoms when drug use is abruptly  discontinued. This can happen even with a short course of this medication.  Addiction: A chronic, relapsing disease characterized by compulsive drug-seeking and use despite negative consequences and by long-lasting changes in the brain.  Many, many more side effects including some that can be serious and/or life threatening. Please discuss these side effects with your pharmacist.   -that you use as little of this medication as possible and only as directed for as short of time as possible -keep this medication in a safe and locked location away from children or others -do not share this medication -dispose of any unused medication in a medication drop box -do not take this medication with other controlled or sedating substances, alcohol or drugs other then those approved by your doctor   How to help anxiety - without medication.   1) Regular Exercise - walking, jogging, cycling, dancing, strength training --> Yoga has been shown in research to reduce depression and anxiety -- with even just one hour long session per week  2)  Begin a Mindfulness/Meditation practice -- this can take a little as 3 minutes and is helpful for all kinds of mood issues -- You can find resources in books -- Or you can download apps like  ---- Headspace App (which currently has free content called "Weathering the Storm") ---- Calm (which has a few free options)  ---- Insignt Timer ---- Stop, Breathe & Think  # With each of these Apps - you should decline the "start free trial" offer and as you search through the App should be able to access some of their free content. You can also chose to pay for the content if you find one that works well for you.   # Many of them also offer sleep specific content which may help with insomnia  3) Healthy Diet -- Avoid or decrease Caffeine -- Avoid or decrease Alcohol -- Drink  plenty of water, have a balanced diet -- Avoid cigarettes and marijuana (as well as other  recreational drugs)  4) Consider contacting a professional therapist  -- [facility] is one option. Call [phone] -- Or you can check out www.psychologytoday.com -- you can read bios of therapists and see if they accept insurance -- Check with your insurance to see if you have coverage and who may take your insurance            Relevant Medications   ALPRAZolam (XANAX) 0.25 MG tablet   Other Relevant Orders   Pain Mgmt, Profile 8 w/Conf, U   Chronic narcotic use    Indication for chronic opioid: fibromyalgia Medication and dose: Tramadol 50 mg- 1 tab by mouth three times daily as needed # pills per month: 90 Last UDS date: 09/19/2018 Opioid Treatment Agreement signed (Y/N): Y Opioid Treatment Agreement last reviewed with patient:  11/05/19 NCCSRS reviewed this encounter (include red flags):  reviewed- no red flags  She will updated UDS/CSC -scheduled for 12/14 @ 3 pm .       Other Visit Diagnoses    Long term prescription opiate use    -  Primary   Relevant Orders   Pain Mgmt, Profile 8 w/Conf, U   Long term prescription benzodiazepine use       Relevant Orders   Pain Mgmt, Profile 8 w/Conf, U      I am having Aletta A. Monsivais "Shelli" maintain her ibuprofen, promethazine, cyclobenzaprine, dicyclomine, ondansetron, hydrocortisone, promethazine, pantoprazole, Vivelle-Dot, metoprolol tartrate, progesterone, traMADol, and ALPRAZolam. We will continue to administer sodium chloride.  Meds ordered this encounter  Medications   traMADol (ULTRAM) 50 MG tablet    Sig: TAKE 1 TABLET BY MOUTH EVERY 8 HOURS    Dispense:  90 tablet    Refill:  0   ALPRAZolam (XANAX) 0.25 MG tablet    Sig: TAKE 1 TABLET BY MOUTH TWICE DAILY AS NEEDED FOR ANXITEY    Dispense:  45 tablet    Refill:  0     Ruthe Mannan, MD

## 2019-11-06 ENCOUNTER — Telehealth (INDEPENDENT_AMBULATORY_CARE_PROVIDER_SITE_OTHER): Payer: 59 | Admitting: Family Medicine

## 2019-11-06 ENCOUNTER — Encounter: Payer: Self-pay | Admitting: Family Medicine

## 2019-11-06 VITALS — BP 123/80 | Temp 97.6°F | Ht 63.5 in | Wt 185.0 lb

## 2019-11-06 DIAGNOSIS — Z79891 Long term (current) use of opiate analgesic: Secondary | ICD-10-CM

## 2019-11-06 DIAGNOSIS — Z79899 Other long term (current) drug therapy: Secondary | ICD-10-CM

## 2019-11-06 DIAGNOSIS — F1190 Opioid use, unspecified, uncomplicated: Secondary | ICD-10-CM

## 2019-11-06 DIAGNOSIS — M797 Fibromyalgia: Secondary | ICD-10-CM

## 2019-11-06 DIAGNOSIS — F411 Generalized anxiety disorder: Secondary | ICD-10-CM

## 2019-11-06 MED ORDER — ALPRAZOLAM 0.25 MG PO TABS: ORAL_TABLET | ORAL | 0 refills | Status: DC

## 2019-11-06 MED ORDER — TRAMADOL HCL 50 MG PO TABS: ORAL_TABLET | ORAL | 0 refills | Status: DC

## 2019-11-10 ENCOUNTER — Other Ambulatory Visit: Payer: 59

## 2019-11-10 ENCOUNTER — Other Ambulatory Visit: Payer: Self-pay

## 2019-11-10 DIAGNOSIS — M797 Fibromyalgia: Secondary | ICD-10-CM

## 2019-11-10 DIAGNOSIS — F411 Generalized anxiety disorder: Secondary | ICD-10-CM

## 2019-11-10 DIAGNOSIS — Z79891 Long term (current) use of opiate analgesic: Secondary | ICD-10-CM

## 2019-11-10 DIAGNOSIS — Z79899 Other long term (current) drug therapy: Secondary | ICD-10-CM

## 2019-11-12 LAB — PAIN MGMT, PROFILE 8 W/CONF, U
6 Acetylmorphine: NEGATIVE ng/mL
Alcohol Metabolites: NEGATIVE ng/mL (ref <500)
Alphahydroxyalprazolam: 175 ng/mL
Alphahydroxymidazolam: NEGATIVE ng/mL
Alphahydroxytriazolam: NEGATIVE ng/mL
Aminoclonazepam: NEGATIVE ng/mL
Amphetamines: NEGATIVE ng/mL
Benzodiazepines: POSITIVE ng/mL
Buprenorphine, Urine: NEGATIVE ng/mL
Cocaine Metabolite: NEGATIVE ng/mL
Creatinine: 268.3 mg/dL
Hydroxyethylflurazepam: NEGATIVE ng/mL
Lorazepam: NEGATIVE ng/mL
MDMA: NEGATIVE ng/mL
Marijuana Metabolite: NEGATIVE ng/mL
Nordiazepam: NEGATIVE ng/mL
Opiates: NEGATIVE ng/mL
Oxazepam: NEGATIVE ng/mL
Oxidant: NEGATIVE ug/mL
Oxycodone: NEGATIVE ng/mL
Temazepam: NEGATIVE ng/mL
pH: 5.7 (ref 4.5–9.0)

## 2019-11-13 ENCOUNTER — Other Ambulatory Visit: Payer: Self-pay | Admitting: Family Medicine

## 2019-11-13 ENCOUNTER — Encounter: Payer: Self-pay | Admitting: Family Medicine

## 2019-11-13 DIAGNOSIS — R7989 Other specified abnormal findings of blood chemistry: Secondary | ICD-10-CM

## 2019-11-17 ENCOUNTER — Other Ambulatory Visit (INDEPENDENT_AMBULATORY_CARE_PROVIDER_SITE_OTHER): Payer: 59

## 2019-11-17 ENCOUNTER — Other Ambulatory Visit: Payer: Self-pay

## 2019-11-17 DIAGNOSIS — R7989 Other specified abnormal findings of blood chemistry: Secondary | ICD-10-CM | POA: Diagnosis not present

## 2019-11-18 ENCOUNTER — Encounter: Payer: Self-pay | Admitting: Family Medicine

## 2019-11-18 LAB — RENAL FUNCTION PANEL
Albumin: 4.3 g/dL (ref 3.5–5.2)
BUN: 16 mg/dL (ref 6–23)
CO2: 28 mEq/L (ref 19–32)
Calcium: 10 mg/dL (ref 8.4–10.5)
Chloride: 104 mEq/L (ref 96–112)
Creatinine, Ser: 0.9 mg/dL (ref 0.40–1.20)
GFR: 64.67 mL/min (ref >60.00)
Glucose, Bld: 94 mg/dL (ref 70–99)
Phosphorus: 3.1 mg/dL (ref 2.3–4.6)
Potassium: 4.9 mEq/L (ref 3.5–5.1)
Sodium: 139 mEq/L (ref 135–145)

## 2019-11-19 ENCOUNTER — Other Ambulatory Visit: Payer: Self-pay

## 2019-11-19 MED ORDER — PROGESTERONE MICRONIZED 100 MG PO CAPS: 100.0000 mg | ORAL_CAPSULE | Freq: Every day | ORAL | 0 refills | Status: DC

## 2019-11-19 MED ORDER — METOPROLOL TARTRATE 25 MG PO TABS: 25.0000 mg | ORAL_TABLET | Freq: Two times a day (BID) | ORAL | 0 refills | Status: DC

## 2019-11-19 NOTE — Telephone Encounter (Signed)
Last OV 11/06/19 Last fill 11/03/19  #60/0

## 2019-12-02 ENCOUNTER — Other Ambulatory Visit: Payer: Self-pay | Admitting: Family Medicine

## 2019-12-03 NOTE — Telephone Encounter (Signed)
Last VV 11/06/19 Last fill for both 11/19/19 #60/0 and #30/0

## 2019-12-13 ENCOUNTER — Other Ambulatory Visit: Payer: Self-pay | Admitting: Family Medicine

## 2019-12-30 ENCOUNTER — Other Ambulatory Visit: Payer: Self-pay

## 2019-12-30 MED ORDER — TRAMADOL HCL 50 MG PO TABS: ORAL_TABLET | ORAL | 5 refills | Status: DC

## 2019-12-30 NOTE — Telephone Encounter (Signed)
TA-Plz see refill req/Per [place] PMP pt is compliant and without red flags/Rx Tramadol prepared and pended for your approval/thx dmf

## 2020-01-01 ENCOUNTER — Encounter: Payer: Self-pay | Admitting: Family Medicine

## 2020-01-02 ENCOUNTER — Other Ambulatory Visit: Payer: Self-pay

## 2020-01-02 MED ORDER — ALPRAZOLAM 0.25 MG PO TABS: ORAL_TABLET | ORAL | 5 refills | Status: DC

## 2020-01-02 NOTE — Telephone Encounter (Signed)
TA-Plz see refill req/Per [place] PMP pt is compliant and without red flags/Rx Alprazolam prepared and pended for your approval/thx dmf

## 2020-02-10 ENCOUNTER — Ambulatory Visit (INDEPENDENT_AMBULATORY_CARE_PROVIDER_SITE_OTHER): Payer: 59 | Admitting: Family Medicine

## 2020-02-10 ENCOUNTER — Other Ambulatory Visit: Payer: Self-pay

## 2020-02-10 ENCOUNTER — Encounter: Payer: Self-pay | Admitting: Family Medicine

## 2020-02-10 VITALS — BP 122/78 | HR 95 | Temp 98.2°F | Resp 12 | Ht 64.0 in | Wt 189.5 lb

## 2020-02-10 DIAGNOSIS — Z13220 Encounter for screening for lipoid disorders: Secondary | ICD-10-CM

## 2020-02-10 DIAGNOSIS — M797 Fibromyalgia: Secondary | ICD-10-CM

## 2020-02-10 DIAGNOSIS — F411 Generalized anxiety disorder: Secondary | ICD-10-CM | POA: Diagnosis not present

## 2020-02-10 DIAGNOSIS — M419 Scoliosis, unspecified: Secondary | ICD-10-CM | POA: Diagnosis not present

## 2020-02-10 DIAGNOSIS — I341 Nonrheumatic mitral (valve) prolapse: Secondary | ICD-10-CM | POA: Insufficient documentation

## 2020-02-10 DIAGNOSIS — F3281 Premenstrual dysphoric disorder: Secondary | ICD-10-CM | POA: Insufficient documentation

## 2020-02-10 NOTE — Assessment & Plan Note (Signed)
Seems well controlled on fairly low dose of tramadol. Continue twice daily - patient requested prescription be cut back. Will do contract at next visit.

## 2020-02-10 NOTE — Progress Notes (Signed)
Subjective:     Margaret Wood is a 59 y.o. female presenting for Transfer of Care (from Dr Deborra Medina)     HPI   #Fibromylagia - taking tramadol 50 mg BID  #alprazolam - for anxiety - as needed for anxiety and sleep - taking this three times a week - cannot take anything that is time released and has serotonin syndrome several times - Sees Dr. Henrene Pastor with GI -- slow gastric emptying  - depression related to PMDD diagnosis (cycle related)   #cyclical vomiting - has not has recent episodes - anti nausea medication on hand for prn use  Has several refills    Review of Systems   Social History   Tobacco Use  Smoking Status Never Smoker  Smokeless Tobacco Never Used        Objective:    BP Readings from Last 3 Encounters:  02/10/20 122/78  11/06/19 123/80  12/04/18 133/75   Wt Readings from Last 3 Encounters:  02/10/20 189 lb 8 oz (86 kg)  11/06/19 185 lb (83.9 kg)  12/04/18 189 lb (85.7 kg)    BP 122/78   Pulse 95   Temp 98.2 F (36.8 C)   Resp 12   Ht 5\' 4"  (1.626 m)   Wt 189 lb 8 oz (86 kg)   SpO2 98%   BMI 32.53 kg/m    Physical Exam Constitutional:      General: She is not in acute distress.    Appearance: She is well-developed. She is not diaphoretic.  HENT:     Right Ear: External ear normal.     Left Ear: External ear normal.     Nose: Nose normal.  Eyes:     Conjunctiva/sclera: Conjunctivae normal.  Cardiovascular:     Rate and Rhythm: Normal rate.  Pulmonary:     Effort: Pulmonary effort is normal.  Musculoskeletal:     Cervical back: Neck supple.  Skin:    General: Skin is warm and dry.     Capillary Refill: Capillary refill takes less than 2 seconds.  Neurological:     Mental Status: She is alert. Mental status is at baseline.  Psychiatric:        Mood and Affect: Mood normal.        Behavior: Behavior normal.      The 10-year ASCVD risk score Mikey Bussing DC Jr., et al., 2013) is: 3.3%   Values used to calculate the  score:     Age: 19 years     Sex: Female     Is Non-Hispanic African American: No     Diabetic: No     Tobacco smoker: No     Systolic Blood Pressure: 269 mmHg     Is BP treated: No     HDL Cholesterol: 33.4 mg/dL     Total Cholesterol: 212 mg/dL      Assessment & Plan:   Problem List Items Addressed This Visit      Musculoskeletal and Integument   Scoliosis     Other   Fibromyalgia - Primary    Seems well controlled on fairly low dose of tramadol. Continue twice daily - Wood requested prescription be cut back. Will do contract at next visit.       Relevant Medications   naproxen sodium (ALEVE) 220 MG tablet   GAD (generalized anxiety disorder)    Hx of serotonin syndrome with antidepressants and has tried several. Only using small dose of alprazolam, has slow stomach processing  and cannot tolerate long acting medication. Will avoid increases and she knows to avoid with tramadol.        Other Visit Diagnoses    Screening for hyperlipidemia       Relevant Orders   Lipid panel       Return in about 6 months (around 08/12/2020).  Lynnda Child, MD

## 2020-02-10 NOTE — Assessment & Plan Note (Signed)
Hx of serotonin syndrome with antidepressants and has tried several. Only using small dose of alprazolam, has slow stomach processing and cannot tolerate long acting medication. Will avoid increases and she knows to avoid with tramadol.

## 2020-02-10 NOTE — Patient Instructions (Signed)
Check cholesterol today  Return when you are due for medication refill

## 2020-02-11 LAB — LIPID PANEL
Cholesterol: 233 mg/dL — ABNORMAL HIGH (ref 0–200)
HDL: 44 mg/dL (ref >39.00)
NonHDL: 188.93
Total CHOL/HDL Ratio: 5
Triglycerides: 332 mg/dL — ABNORMAL HIGH (ref 0.0–149.0)
VLDL: 66.4 mg/dL — ABNORMAL HIGH (ref 0.0–40.0)

## 2020-02-11 LAB — LDL CHOLESTEROL, DIRECT: Direct LDL: 113 mg/dL

## 2020-02-22 ENCOUNTER — Ambulatory Visit: Payer: 59 | Attending: Internal Medicine

## 2020-02-22 DIAGNOSIS — Z23 Encounter for immunization: Secondary | ICD-10-CM

## 2020-02-22 NOTE — Progress Notes (Signed)
   Covid-19 Vaccination Clinic  Name:  Margaret Wood    MRN: [id number] DOB: Feb 20, 1961  02/22/2020  [name] was observed post Covid-19 immunization for 15 minutes without incident. She was provided with Vaccine Information Sheet and instruction to access the V-Safe system.   [name] was instructed to call 911 with any severe reactions post vaccine: Marland Kitchen Difficulty breathing  . Swelling of face and throat  . A fast heartbeat  . A bad rash all over body  . Dizziness and weakness   Immunizations Administered    Name Date Dose VIS Date Route   Pfizer COVID-19 Vaccine 02/22/2020 12:54 PM 0.3 mL 11/07/2019 Intramuscular   Manufacturer: [employer], [employer]   Lot: [id number]   NDC: [address]-0

## 2020-03-16 ENCOUNTER — Ambulatory Visit: Payer: 59 | Attending: Internal Medicine

## 2020-03-16 DIAGNOSIS — Z23 Encounter for immunization: Secondary | ICD-10-CM

## 2020-03-16 NOTE — Progress Notes (Signed)
   Covid-19 Vaccination Clinic  Name:  Margaret Wood    MRN: [id number] DOB: 06-Jul-1961  03/16/2020  [name] was observed post Covid-19 immunization for 15 minutes without incident. She was provided with Vaccine Information Sheet and instruction to access the V-Safe system.   [name] was instructed to call 911 with any severe reactions post vaccine: Marland Kitchen Difficulty breathing  . Swelling of face and throat  . A fast heartbeat  . A bad rash all over body  . Dizziness and weakness   Immunizations Administered    Name Date Dose VIS Date Route   Pfizer COVID-19 Vaccine 03/16/2020  3:32 PM 0.3 mL 01/21/2019 Intramuscular   Manufacturer: [employer], [employer]   Lot: [id number]   NDC: [address]-0

## 2020-03-27 IMAGING — NM NM GASTRIC EMPTYING
1 series · 10 of 10 positions shown · non-contrast
Comparison: None.

CLINICAL DATA: Upper abdominal pain, epigastric pain.
Gastroesophageal reflux disease. Nausea and vomiting.

EXAM:
NUCLEAR MEDICINE GASTRIC EMPTYING SCAN
TECHNIQUE: After oral ingestion of radiolabeled meal, sequential abdominal
images were obtained for 4 hours. Percentage of activity emptying
the stomach was calculated at 1 hour, 2 hour, 3 hour, and 4 hours.
RADIOPHARMACEUTICALS:  2.17 mCi Dc-NNm sulfur colloid in
standardized meal

[Series 1000: gatric statics (results) · 3.90mm/px · 5 acquisitions, 10 frames shown]
[im 1/5]
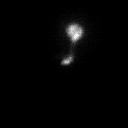
[im 1/5]
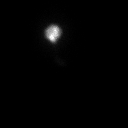
[im 2/5]
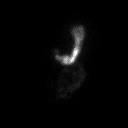
[im 2/5]
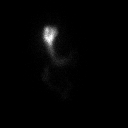
[im 3/5]
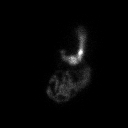
[im 3/5]
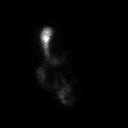
[im 4/5]
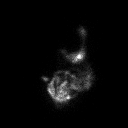
[im 4/5]
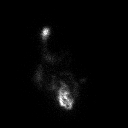
[im 5/5]
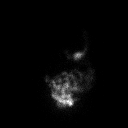
[im 5/5]
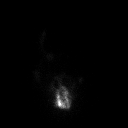

[10 of 10 positions shown; findings below may reference images not displayed]

FINDINGS: Expected location of the stomach in the left upper quadrant.
Ingested meal empties the stomach gradually over the course of the
study.

11% emptied at 1 hr ( normal >= 10%)

38% emptied at 2 hr ( normal >= 40%)

76% emptied at 3 hr ( normal >= 70%)

94% emptied at 4 hr ( normal >= 90%)
IMPRESSION: Normal gastric emptying study.

## 2020-07-12 ENCOUNTER — Encounter: Payer: Self-pay | Admitting: Family Medicine

## 2020-07-12 MED ORDER — HYDROCORTISONE ACETATE 25 MG RE SUPP: RECTAL | 0 refills | Status: DC

## 2020-07-27 ENCOUNTER — Ambulatory Visit: Payer: 59 | Admitting: Family Medicine

## 2020-08-17 ENCOUNTER — Ambulatory Visit (INDEPENDENT_AMBULATORY_CARE_PROVIDER_SITE_OTHER): Payer: 59 | Admitting: Family Medicine

## 2020-08-17 ENCOUNTER — Encounter: Payer: Self-pay | Admitting: Family Medicine

## 2020-08-17 ENCOUNTER — Other Ambulatory Visit: Payer: Self-pay

## 2020-08-17 VITALS — BP 118/80 | HR 80 | Temp 98.1°F | Wt 198.8 lb

## 2020-08-17 DIAGNOSIS — R1115 Cyclical vomiting syndrome unrelated to migraine: Secondary | ICD-10-CM | POA: Diagnosis not present

## 2020-08-17 DIAGNOSIS — M797 Fibromyalgia: Secondary | ICD-10-CM | POA: Diagnosis not present

## 2020-08-17 DIAGNOSIS — F332 Major depressive disorder, recurrent severe without psychotic features: Secondary | ICD-10-CM | POA: Diagnosis not present

## 2020-08-17 DIAGNOSIS — Z23 Encounter for immunization: Secondary | ICD-10-CM | POA: Diagnosis not present

## 2020-08-17 DIAGNOSIS — Z79890 Hormone replacement therapy (postmenopausal): Secondary | ICD-10-CM

## 2020-08-17 DIAGNOSIS — F1190 Opioid use, unspecified, uncomplicated: Secondary | ICD-10-CM

## 2020-08-17 DIAGNOSIS — F411 Generalized anxiety disorder: Secondary | ICD-10-CM

## 2020-08-17 DIAGNOSIS — K219 Gastro-esophageal reflux disease without esophagitis: Secondary | ICD-10-CM | POA: Insufficient documentation

## 2020-08-17 MED ORDER — TRAMADOL HCL 50 MG PO TABS: ORAL_TABLET | ORAL | 5 refills | Status: DC

## 2020-08-17 MED ORDER — METOPROLOL TARTRATE 25 MG PO TABS: 25.0000 mg | ORAL_TABLET | Freq: Two times a day (BID) | ORAL | 11 refills | Status: DC

## 2020-08-17 MED ORDER — DICYCLOMINE HCL 10 MG PO CAPS: ORAL_CAPSULE | ORAL | 2 refills | Status: DC

## 2020-08-17 MED ORDER — ESTRADIOL 0.05 MG/24HR TD PTTW: MEDICATED_PATCH | TRANSDERMAL | 3 refills | Status: DC

## 2020-08-17 MED ORDER — ALPRAZOLAM 0.25 MG PO TABS: ORAL_TABLET | ORAL | 5 refills | Status: DC

## 2020-08-17 MED ORDER — PROGESTERONE MICRONIZED 100 MG PO CAPS: 100.0000 mg | ORAL_CAPSULE | Freq: Every day | ORAL | 3 refills | Status: DC

## 2020-08-17 MED ORDER — PROMETHAZINE HCL 12.5 MG PO TABS: ORAL_TABLET | ORAL | 0 refills | Status: DC

## 2020-08-17 MED ORDER — PANTOPRAZOLE SODIUM 40 MG PO TBEC: 40.0000 mg | DELAYED_RELEASE_TABLET | Freq: Every day | ORAL | 3 refills | Status: DC | PRN

## 2020-08-17 NOTE — Assessment & Plan Note (Signed)
Cont estrogen and progesterone. However discussed as she is not sure how long she has been on medication and age >4 we should consider discussing a plan for stopping. She will consider and potentially plan to taper in 6 months.

## 2020-08-17 NOTE — Assessment & Plan Note (Addendum)
Stable with occasional promethazine. Also on pantoprazole. Briefly discussed that long-term use of pantoprazole is not ideal. Will continue reassess. Had normal EGD and gastric emptying study in 2020

## 2020-08-17 NOTE — Assessment & Plan Note (Signed)
Stable on pantoprazole. Decrease aleve use and hopefully stop in the future.

## 2020-08-17 NOTE — Assessment & Plan Note (Signed)
Feels she is stable. Using approximately 6 xanax/week. Discussed dose decrease from #45/month to #30/month and she will do so. Cont xanax and minimize use.

## 2020-08-17 NOTE — Patient Instructions (Addendum)
Curcumin or Tumeric  Research has shown that Curcumin (Tumeric) supplements are just as good as high dose anti-inflammatory medications (like diclofenac and ibuprofen) at treating pain from osteoarthritis without the side effects.   Take Curcumin 500 mg Three times daily   -- You can find a bottle at [employer] for $8 which should last a month -- [place] Brand is around $9 and is available at [facility]   If you can stop Aleve, it may be worth trying to either reduce the dose of pantoprazole or to stop. But restart if you get heartburn

## 2020-08-17 NOTE — Progress Notes (Signed)
Subjective:     Margaret Wood is a 59 y.o. female presenting for Medication Refill     HPI   #Fibromylagia - taking 2-3 tramadol per day    #cyclical vomiting - phenergan 6 times per month - also with vertigo   #Menopause - estrogen patch - not sure how long she has been on this   Review of Systems   Social History   Tobacco Use  Smoking Status Never Smoker  Smokeless Tobacco Never Used        Objective:    BP Readings from Last 3 Encounters:  08/17/20 118/80  02/10/20 122/78  11/06/19 123/80   Wt Readings from Last 3 Encounters:  08/17/20 198 lb 12 oz (90.2 kg)  02/10/20 189 lb 8 oz (86 kg)  11/06/19 185 lb (83.9 kg)    BP 118/80   Pulse 80   Temp 98.1 F (36.7 C) (Temporal)   Wt 198 lb 12 oz (90.2 kg)   SpO2 97%   BMI 34.12 kg/m    Physical Exam Constitutional:      General: She is not in acute distress.    Appearance: She is well-developed. She is not diaphoretic.  HENT:     Right Ear: External ear normal.     Left Ear: External ear normal.     Nose: Nose normal.  Eyes:     Conjunctiva/sclera: Conjunctivae normal.  Cardiovascular:     Rate and Rhythm: Normal rate and regular rhythm.     Heart sounds: No murmur heard.   Pulmonary:     Effort: Pulmonary effort is normal. No respiratory distress.     Breath sounds: Normal breath sounds. No wheezing.  Musculoskeletal:     Cervical back: Neck supple.     Comments: Hypermobile  Skin:    General: Skin is warm and dry.     Capillary Refill: Capillary refill takes less than 2 seconds.  Neurological:     Mental Status: She is alert. Mental status is at baseline.  Psychiatric:        Mood and Affect: Mood normal.        Behavior: Behavior normal.      The 10-year ASCVD risk score Denman George DC Jr., et al., 2013) is: 2.9%   Values used to calculate the score:     Age: 58 years     Sex: Female     Is Non-Hispanic African American: No     Diabetic: No     Tobacco smoker: No      Systolic Blood Pressure: 118 mmHg     Is BP treated: No     HDL Cholesterol: 44 mg/dL     Total Cholesterol: 233 mg/dL      Assessment & Plan:   Problem List Items Addressed This Visit      Digestive   Cyclical vomiting    Stable with occasional promethazine. Also on pantoprazole. Briefly discussed that long-term use of pantoprazole is not ideal. Will continue reassess. Had normal EGD and gastric emptying study in 2020      Relevant Medications   promethazine (PHENERGAN) 12.5 MG tablet   Gastroesophageal reflux disease    Stable on pantoprazole. Decrease aleve use and hopefully stop in the future.       Relevant Medications   dicyclomine (BENTYL) 10 MG capsule   pantoprazole (PROTONIX) 40 MG tablet     Other   Fibromyalgia    Discussed tumeric to see if this will provide more  pain control than aleve BID. She will also continue to work on limiting tramadol and dose decreased from #90/month to #80/month. Suspect that she may not even need this many. Will continue to assess.       Relevant Medications   traMADol (ULTRAM) 50 MG tablet   MDD (major depressive disorder), recurrent severe, without psychosis (HCC)   Relevant Medications   ALPRAZolam (XANAX) 0.25 MG tablet   GAD (generalized anxiety disorder)    Feels she is stable. Using approximately 6 xanax/week. Discussed dose decrease from #45/month to #30/month and she will do so. Cont xanax and minimize use.       Relevant Medications   ALPRAZolam (XANAX) 0.25 MG tablet   Postmenopausal HRT (hormone replacement therapy)    Cont estrogen and progesterone. However discussed as she is not sure how long she has been on medication and age >64 we should consider discussing a plan for stopping. She will consider and potentially plan to taper in 6 months.       Relevant Medications   estradiol (VIVELLE-DOT) 0.05 MG/24HR patch   progesterone (PROMETRIUM) 100 MG capsule   Chronic narcotic use    Other Visit Diagnoses    Need  for influenza vaccination    -  Primary   Relevant Orders   Flu Vaccine QUAD 36+ mos IM       Return in about 6 months (around 02/14/2021).  Lynnda Child, MD  This visit occurred during the SARS-CoV-2 public health emergency.  Safety protocols were in place, including screening questions prior to the visit, additional usage of staff PPE, and extensive cleaning of exam room while observing appropriate contact time as indicated for disinfecting solutions.

## 2020-08-17 NOTE — Assessment & Plan Note (Signed)
Discussed tumeric to see if this will provide more pain control than aleve BID. She will also continue to work on limiting tramadol and dose decreased from #90/month to #80/month. Suspect that she may not even need this many. Will continue to assess.

## 2020-10-05 ENCOUNTER — Encounter: Payer: Self-pay | Admitting: Family Medicine

## 2021-02-01 ENCOUNTER — Other Ambulatory Visit: Payer: Self-pay

## 2021-02-01 ENCOUNTER — Encounter: Payer: Self-pay | Admitting: Family Medicine

## 2021-02-01 DIAGNOSIS — M797 Fibromyalgia: Secondary | ICD-10-CM

## 2021-02-01 DIAGNOSIS — F411 Generalized anxiety disorder: Secondary | ICD-10-CM

## 2021-02-01 MED ORDER — TRAMADOL HCL 50 MG PO TABS: ORAL_TABLET | ORAL | 2 refills | Status: DC

## 2021-02-01 MED ORDER — ALPRAZOLAM 0.25 MG PO TABS: ORAL_TABLET | ORAL | 2 refills | Status: DC

## 2021-02-02 ENCOUNTER — Telehealth: Payer: Self-pay

## 2021-02-02 NOTE — Telephone Encounter (Signed)
Received PA request from [facility] for the following Rx: Tramadol Hcl 50 mg tablets  PA submitted through cover my meds. Awaiting response.

## 2021-02-04 NOTE — Telephone Encounter (Signed)
PA approved from 02/02/2021 through 08/05/2021.

## 2021-05-05 ENCOUNTER — Encounter: Payer: Self-pay | Admitting: Family Medicine

## 2021-05-05 LAB — HM MAMMOGRAPHY

## 2021-05-05 LAB — RESULTS CONSOLE HPV: CHL HPV: NEGATIVE

## 2021-05-05 LAB — HM PAP SMEAR: HM Pap smear: NEGATIVE

## 2021-05-09 ENCOUNTER — Telehealth: Payer: Self-pay | Admitting: Family Medicine

## 2021-05-09 NOTE — Telephone Encounter (Signed)
Mr. Notarianni called in wanted to know about getting a referal for his wife to a Cardiologist in [place], [place]. They are under [facility]  [phone]) and the fax is [phone] and they are located [address] And the first available provider is okay.   Please advise

## 2021-05-09 NOTE — Telephone Encounter (Signed)
Hey doctor Selena Batten, I had scheduled an appointment for what I thought was a primary care physician here in [place], but it turns out she only does gyn. I went to that appt but have now scheduled another appt with a for sure pcp however they can't get me in until February.

## 2021-05-10 NOTE — Telephone Encounter (Signed)
Sent mychart to patient  Would advise virtual visit with me to discuss.

## 2021-05-17 ENCOUNTER — Telehealth: Payer: Self-pay | Admitting: *Deleted

## 2021-05-17 NOTE — Telephone Encounter (Signed)
Patient had scheduled an appointment thru mychart on 06/09/21 with Dr. Selena Batten for medicine refills and tachycardia  worsening significantly Message was given to triage to contact the patient.  Called and spoke to patient and was advised that she has moved to [place] and can not get in to see her new PCP until February. Patient stated that her tachycardia has gotten worse in the past two months and that is about the time that she stopped her hormone replacement. Patient stated that she has seen a new GYN where she lives now. Patient stated that the GYN told her that she does not feel comfortable starting her back on hormone replacement until an ultrasound has been done. Patient stated that she will be having an ultrasound soon. Patient was offered an earlier appointment which she declined. Patient stated that she lives 3 hours away and can not get a ride to see Dr. Selena Batten until 06/09/21. Patient was given ER precautions and she verbalized understanding.

## 2021-05-17 NOTE — Telephone Encounter (Signed)
Agree with ER precautions will discuss at our visit

## 2021-06-09 ENCOUNTER — Other Ambulatory Visit: Payer: Self-pay

## 2021-06-09 ENCOUNTER — Ambulatory Visit (INDEPENDENT_AMBULATORY_CARE_PROVIDER_SITE_OTHER): Payer: BC Managed Care – PPO | Admitting: Family Medicine

## 2021-06-09 VITALS — BP 122/68 | HR 94 | Temp 97.9°F | Ht 64.0 in | Wt 198.8 lb

## 2021-06-09 DIAGNOSIS — R000 Tachycardia, unspecified: Secondary | ICD-10-CM | POA: Insufficient documentation

## 2021-06-09 DIAGNOSIS — R1115 Cyclical vomiting syndrome unrelated to migraine: Secondary | ICD-10-CM

## 2021-06-09 DIAGNOSIS — F1190 Opioid use, unspecified, uncomplicated: Secondary | ICD-10-CM

## 2021-06-09 DIAGNOSIS — F411 Generalized anxiety disorder: Secondary | ICD-10-CM | POA: Diagnosis not present

## 2021-06-09 DIAGNOSIS — E782 Mixed hyperlipidemia: Secondary | ICD-10-CM

## 2021-06-09 DIAGNOSIS — M797 Fibromyalgia: Secondary | ICD-10-CM | POA: Diagnosis not present

## 2021-06-09 MED ORDER — PROMETHAZINE HCL 12.5 MG PO TABS: ORAL_TABLET | ORAL | 0 refills | Status: DC

## 2021-06-09 MED ORDER — HYDROCORTISONE ACETATE 25 MG RE SUPP
25.0000 mg | RECTAL | 0 refills | Status: DC | PRN
Start: 2021-06-09 — End: 2023-02-06

## 2021-06-09 MED ORDER — ALPRAZOLAM 0.25 MG PO TABS: ORAL_TABLET | ORAL | 1 refills | Status: DC

## 2021-06-09 MED ORDER — TRAMADOL HCL 50 MG PO TABS: ORAL_TABLET | ORAL | 5 refills | Status: DC

## 2021-06-09 MED ORDER — DICYCLOMINE HCL 10 MG PO CAPS
ORAL_CAPSULE | ORAL | 2 refills | Status: DC
Start: 2021-06-09 — End: 2021-08-22

## 2021-06-09 MED ORDER — METOPROLOL TARTRATE 25 MG PO TABS: 25.0000 mg | ORAL_TABLET | Freq: Three times a day (TID) | ORAL | 2 refills | Status: DC

## 2021-06-09 MED ORDER — CYCLOBENZAPRINE HCL 10 MG PO TABS
10.0000 mg | ORAL_TABLET | Freq: Every evening | ORAL | 0 refills | Status: DC | PRN
Start: 2021-06-09 — End: 2023-08-13

## 2021-06-09 NOTE — Assessment & Plan Note (Signed)
Diagnosed several years ago, now with persistent symptoms. Advised increasing metoprolol 25 mg BID to TID to see if that improves symptom control. Mychart if no response. Also recent post menopausal bleeding (seeing ob/gyn) but will evaluate for anemia. She notes positional tachycardia - recommend searching for POTS exercise regiment and to do exercise in incline until tolerating standing.

## 2021-06-09 NOTE — Patient Instructions (Signed)
#  Tachycardia - try taking metoprolol up to 3 times a day - monitor for initial response - does 25 mg work even for only 2-3 hours vs does it not work (would want to increase dose)  #pain - continue medication - work to minimize tramadol as much as possible  Do not take tramadol, xanax, and cyclobenzaprine together -- risk of respiratory suppression

## 2021-06-09 NOTE — Assessment & Plan Note (Signed)
Encouraged continued reduction in tramadol. Encouraged regular exercise as tolerated. Refill provided x 6 months.

## 2021-06-09 NOTE — Assessment & Plan Note (Signed)
Stable on current regimen. Xanax 0.25 2-3 times per week - do not take with tramadol.

## 2021-06-09 NOTE — Assessment & Plan Note (Signed)
Stable. Cont prn promethazine

## 2021-06-09 NOTE — Progress Notes (Signed)
Subjective:     Margaret Wood is a 60 y.o. female presenting for Medication Refill and Tachycardia     HPI  #Tachycardia - taking metoprolol 25 mg bid and not working - got worse when she stopped her hormones - seeing GYN and getting uterine biopsy and getting cancer markers looked at  - saw cardiology a long time ago with stress test and holter showing sinus tach - medicine seems to wear off and HR >100 - also sometimes will go from 70 to 120 with standing  #fibromylagia - has reduced tramadol to 2-3 daily, #80 per month - rare flexeril  #anxiety - using xanax a few times per week  #hemorrhoids - flared recently   Review of Systems   Social History   Tobacco Use  Smoking Status Never  Smokeless Tobacco Never        Objective:    BP Readings from Last 3 Encounters:  06/09/21 122/68  08/17/20 118/80  02/10/20 122/78   Wt Readings from Last 3 Encounters:  06/09/21 198 lb 12 oz (90.2 kg)  08/17/20 198 lb 12 oz (90.2 kg)  02/10/20 189 lb 8 oz (86 kg)    BP 122/68   Pulse 94   Temp 97.9 F (36.6 C) (Temporal)   Ht 5\' 4"  (1.626 m)   Wt 198 lb 12 oz (90.2 kg)   SpO2 98%   BMI 34.12 kg/m    Physical Exam Constitutional:      General: She is not in acute distress.    Appearance: She is well-developed. She is not diaphoretic.  HENT:     Right Ear: External ear normal.     Left Ear: External ear normal.  Eyes:     Conjunctiva/sclera: Conjunctivae normal.  Cardiovascular:     Rate and Rhythm: Normal rate and regular rhythm.     Heart sounds: No murmur heard. Pulmonary:     Effort: Pulmonary effort is normal. No respiratory distress.     Breath sounds: Normal breath sounds. No wheezing.  Musculoskeletal:     Cervical back: Neck supple.  Skin:    General: Skin is warm and dry.     Capillary Refill: Capillary refill takes less than 2 seconds.  Neurological:     Mental Status: She is alert. Mental status is at baseline.  Psychiatric:         Mood and Affect: Mood normal.        Behavior: Behavior normal.          Assessment & Plan:   Problem List Items Addressed This Visit       Digestive   Cyclical vomiting    Stable. Cont prn promethazine       Relevant Medications   promethazine (PHENERGAN) 12.5 MG tablet     Other   Fibromyalgia    Encouraged continued reduction in tramadol. Encouraged regular exercise as tolerated. Refill provided x 6 months.        Relevant Medications   traMADol (ULTRAM) 50 MG tablet   cyclobenzaprine (FLEXERIL) 10 MG tablet   GAD (generalized anxiety disorder)    Stable on current regimen. Xanax 0.25 2-3 times per week - do not take with tramadol.        Relevant Medications   ALPRAZolam (XANAX) 0.25 MG tablet   Chronic narcotic use   Sinus tachycardia - Primary    Diagnosed several years ago, now with persistent symptoms. Advised increasing metoprolol 25 mg BID to TID to see if  that improves symptom control. Mychart if no response. Also recent post menopausal bleeding (seeing ob/gyn) but will evaluate for anemia. She notes positional tachycardia - recommend searching for POTS exercise regiment and to do exercise in incline until tolerating standing.        Relevant Medications   metoprolol tartrate (LOPRESSOR) 25 MG tablet   Other Relevant Orders   Comprehensive metabolic panel   CBC with Differential   TSH   Ferritin   Other Visit Diagnoses     Mixed hyperlipidemia       Relevant Medications   metoprolol tartrate (LOPRESSOR) 25 MG tablet   Other Relevant Orders   Lipid panel        Return in about 6 months (around 12/10/2021) for tramadol .  Lynnda Child, MD  This visit occurred during the SARS-CoV-2 public health emergency.  Safety protocols were in place, including screening questions prior to the visit, additional usage of staff PPE, and extensive cleaning of exam room while observing appropriate contact time as indicated for disinfecting solutions.

## 2021-06-10 ENCOUNTER — Encounter: Payer: Self-pay | Admitting: Family Medicine

## 2021-06-10 LAB — CBC WITH DIFFERENTIAL/PLATELET
Basophils Absolute: 0.1 10*3/uL (ref 0.0–0.1)
Basophils Relative: 1.2 % (ref 0.0–3.0)
Eosinophils Absolute: 0.3 10*3/uL (ref 0.0–0.7)
Eosinophils Relative: 4.2 % (ref 0.0–5.0)
HCT: 38 % (ref 36.0–46.0)
Hemoglobin: 12.9 g/dL (ref 12.0–15.0)
Lymphocytes Relative: 29.4 % (ref 12.0–46.0)
Lymphs Abs: 2.2 10*3/uL (ref 0.7–4.0)
MCHC: 33.9 g/dL (ref 30.0–36.0)
MCV: 87.1 fl (ref 78.0–100.0)
Monocytes Absolute: 0.5 10*3/uL (ref 0.1–1.0)
Monocytes Relative: 5.9 % (ref 3.0–12.0)
Neutro Abs: 4.5 10*3/uL (ref 1.4–7.7)
Neutrophils Relative %: 59.3 % (ref 43.0–77.0)
Platelets: 293 10*3/uL (ref 150.0–400.0)
RBC: 4.37 Mil/uL (ref 3.87–5.11)
RDW: 13.9 % (ref 11.5–15.5)
WBC: 7.6 10*3/uL (ref 4.0–10.5)

## 2021-06-10 LAB — COMPREHENSIVE METABOLIC PANEL
ALT: 16 U/L (ref 0–35)
AST: 23 U/L (ref 0–37)
Albumin: 4.4 g/dL (ref 3.5–5.2)
Alkaline Phosphatase: 88 U/L (ref 39–117)
BUN: 14 mg/dL (ref 6–23)
CO2: 28 mEq/L (ref 19–32)
Calcium: 9.5 mg/dL (ref 8.4–10.5)
Chloride: 102 mEq/L (ref 96–112)
Creatinine, Ser: 1.21 mg/dL — ABNORMAL HIGH (ref 0.40–1.20)
GFR: 49.59 mL/min — ABNORMAL LOW (ref >60.00)
Glucose, Bld: 100 mg/dL — ABNORMAL HIGH (ref 70–99)
Potassium: 4.2 mEq/L (ref 3.5–5.1)
Sodium: 139 mEq/L (ref 135–145)
Total Bilirubin: 0.9 mg/dL (ref 0.2–1.2)
Total Protein: 7.6 g/dL (ref 6.0–8.3)

## 2021-06-10 LAB — LIPID PANEL
Cholesterol: 290 mg/dL — ABNORMAL HIGH (ref 0–200)
HDL: 37.8 mg/dL — ABNORMAL LOW (ref >39.00)
Total CHOL/HDL Ratio: 8
Triglycerides: 1051 mg/dL — ABNORMAL HIGH (ref 0.0–149.0)

## 2021-06-10 LAB — FERRITIN: Ferritin: 10.6 ng/mL (ref 10.0–291.0)

## 2021-06-10 LAB — TSH: TSH: 3.38 u[IU]/mL (ref 0.35–5.50)

## 2021-06-10 LAB — LDL CHOLESTEROL, DIRECT: Direct LDL: 57 mg/dL

## 2021-06-17 ENCOUNTER — Encounter: Payer: Self-pay | Admitting: Family Medicine

## 2021-06-17 DIAGNOSIS — R000 Tachycardia, unspecified: Secondary | ICD-10-CM

## 2021-06-17 DIAGNOSIS — I341 Nonrheumatic mitral (valve) prolapse: Secondary | ICD-10-CM

## 2021-06-21 MED ORDER — CARVEDILOL 12.5 MG PO TABS: 12.5000 mg | ORAL_TABLET | Freq: Two times a day (BID) | ORAL | 0 refills | Status: DC

## 2021-06-21 NOTE — Addendum Note (Signed)
Addended by: Lynnda Child on: 06/21/2021 09:05 AM   Modules accepted: Orders

## 2021-06-23 ENCOUNTER — Encounter: Payer: Self-pay | Admitting: Family Medicine

## 2021-07-13 ENCOUNTER — Other Ambulatory Visit: Payer: Self-pay | Admitting: Family Medicine

## 2021-07-13 DIAGNOSIS — R000 Tachycardia, unspecified: Secondary | ICD-10-CM

## 2021-07-13 NOTE — Telephone Encounter (Signed)
Sent mychart message asking if pt has found a cardiologist.

## 2021-08-18 ENCOUNTER — Other Ambulatory Visit: Payer: Self-pay | Admitting: Family Medicine

## 2021-08-18 DIAGNOSIS — F411 Generalized anxiety disorder: Secondary | ICD-10-CM

## 2021-08-18 NOTE — Telephone Encounter (Signed)
Last filled 06/09/21 #30 with 1 refill LOV: 06/09/21 med refill and tachycardia No future appointments  Last UDS 11/10/2019 Contract letter: 11/05/2019

## 2021-08-19 ENCOUNTER — Other Ambulatory Visit: Payer: Self-pay | Admitting: Family Medicine

## 2021-11-03 ENCOUNTER — Other Ambulatory Visit: Payer: Self-pay | Admitting: Family Medicine

## 2021-11-03 DIAGNOSIS — K219 Gastro-esophageal reflux disease without esophagitis: Secondary | ICD-10-CM

## 2021-11-03 NOTE — Telephone Encounter (Signed)
Mychart sent to pt.

## 2021-11-07 ENCOUNTER — Other Ambulatory Visit: Payer: Self-pay | Admitting: Family Medicine

## 2021-12-01 ENCOUNTER — Other Ambulatory Visit: Payer: Self-pay | Admitting: Family Medicine

## 2022-01-30 ENCOUNTER — Ambulatory Visit (INDEPENDENT_AMBULATORY_CARE_PROVIDER_SITE_OTHER): Payer: BC Managed Care – PPO | Admitting: Family Medicine

## 2022-01-30 ENCOUNTER — Other Ambulatory Visit: Payer: Self-pay

## 2022-01-30 VITALS — BP 122/80 | HR 79 | Temp 98.9°F | Ht 63.5 in | Wt 197.2 lb

## 2022-01-30 DIAGNOSIS — M797 Fibromyalgia: Secondary | ICD-10-CM

## 2022-01-30 DIAGNOSIS — K219 Gastro-esophageal reflux disease without esophagitis: Secondary | ICD-10-CM | POA: Diagnosis not present

## 2022-01-30 DIAGNOSIS — E559 Vitamin D deficiency, unspecified: Secondary | ICD-10-CM | POA: Diagnosis not present

## 2022-01-30 DIAGNOSIS — F411 Generalized anxiety disorder: Secondary | ICD-10-CM | POA: Diagnosis not present

## 2022-01-30 DIAGNOSIS — R000 Tachycardia, unspecified: Secondary | ICD-10-CM

## 2022-01-30 DIAGNOSIS — Z0000 Encounter for general adult medical examination without abnormal findings: Secondary | ICD-10-CM | POA: Diagnosis not present

## 2022-01-30 DIAGNOSIS — Z1211 Encounter for screening for malignant neoplasm of colon: Secondary | ICD-10-CM

## 2022-01-30 DIAGNOSIS — L309 Dermatitis, unspecified: Secondary | ICD-10-CM

## 2022-01-30 DIAGNOSIS — F332 Major depressive disorder, recurrent severe without psychotic features: Secondary | ICD-10-CM

## 2022-01-30 MED ORDER — PANTOPRAZOLE SODIUM 40 MG PO TBEC: 40.0000 mg | DELAYED_RELEASE_TABLET | Freq: Every day | ORAL | 0 refills | Status: DC

## 2022-01-30 MED ORDER — ALPRAZOLAM 0.25 MG PO TABS: 0.2500 mg | ORAL_TABLET | Freq: Every day | ORAL | 1 refills | Status: DC | PRN

## 2022-01-30 MED ORDER — TRAMADOL HCL 50 MG PO TABS: 50.0000 mg | ORAL_TABLET | Freq: Three times a day (TID) | ORAL | 5 refills | Status: DC | PRN

## 2022-01-30 MED ORDER — METOPROLOL TARTRATE 25 MG PO TABS: 25.0000 mg | ORAL_TABLET | Freq: Two times a day (BID) | ORAL | 1 refills | Status: DC

## 2022-01-30 MED ORDER — TRIAMCINOLONE ACETONIDE 0.1 % EX CREA: 1.0000 "application " | TOPICAL_CREAM | Freq: Two times a day (BID) | CUTANEOUS | 0 refills | Status: DC

## 2022-01-30 NOTE — Progress Notes (Signed)
Annual Exam   Chief Complaint:  Chief Complaint  Patient presents with   Annual Exam    Requesting records from OBGYN in [place]    History of Present Illness:  Ms. Margaret Wood is a 61 y.o. G1P0 who LMP was No LMP recorded. (Menstrual status: Perimenopausal)., presents today for her annual examination.     Nutrition She does get adequate calcium and Vitamin D in her diet. Diet: generally healthy Exercise: just moved back - walking stairs at home  Safety The patient wears seatbelts: yes.     The patient feels safe at home and in their relationships: yes.   Menstrual:  Symptoms of menopause: none   GYN She is single partner, contraception - post menopausal status.    Cervical Cancer Screening (21-65):   Last Pap:   June  2022 Results were: no abnormalities /neg HPV DNA  Breast Cancer Screening (Age 49-74):  There is no FH of breast cancer. There is no FH of ovarian cancer. BRCA screening Not Indicated.  Last Mammogram: 2019 The patient does want a mammogram this year.    Colon Cancer Screening:  Age 21-75 yo - benefits outweigh the risk. Adults 35-85 yo who have never been screened benefit.  Benefits: 134000 people in 2016 will be diagnosed and 49,000 will die - early detection helps [place]: Complications 2/2 to colonoscopy High Risk (Colonoscopy): genetic disorder (Lynch syndrome or familial adenomatous polyposis), personal hx of IBD, previous adenomatous polyp, or previous colorectal cancer, FamHx start 10 years before the age at diagnosis, increased in males and black race  Options:  FIT - looks for hemoglobin (blood in the stool) - specific and fairly sensitive - must be done annually Cologuard - looks for DNA and blood - more sensitive - therefore can have more false positives, every 3 years Colonoscopy - every 10 years if normal - sedation, bowl prep, must have someone drive you  Shared decision making and the patient had decided to do  cologuard.   Social History   Tobacco Use  Smoking Status Never  Smokeless Tobacco Never    Lung Cancer Screening (Ages 4-80): no  Weight Wt Readings from Last 3 Encounters:  01/30/22 197 lb 4 oz (89.5 kg)  06/09/21 198 lb 12 oz (90.2 kg)  08/17/20 198 lb 12 oz (90.2 kg)   Patient has high BMI  BMI Readings from Last 1 Encounters:  01/30/22 34.39 kg/m     Chronic disease screening Blood pressure monitoring:  BP Readings from Last 3 Encounters:  01/30/22 122/80  06/09/21 122/68  08/17/20 118/80    Lipid Monitoring: Indication for screening: age >17, obesity, diabetes, family hx, CV risk factors.  Lipid screening: Yes  Lab Results  Component Value Date   CHOL 290 (H) 06/09/2021   HDL 37.80 (L) 06/09/2021   LDLDIRECT 57.0 06/09/2021   TRIG (H) 06/09/2021    1051.0 Triglyceride is over 400; calculations on Lipids are invalid.   CHOLHDL 8 06/09/2021     Diabetes Screening: age >45, overweight, family hx, PCOS, hx of gestational diabetes, at risk ethnicity Diabetes Screening screening: Yes  Lab Results  Component Value Date   HGBA1C 5.1 02/23/2014     Past Medical History:  Diagnosis Date   Anxiety    Arthritis    Depression    Fibromyalgia    Gallstones    H/O [facility] spotted fever    IBS (irritable bowel syndrome)    Mitral valve prolapse    Palpitations  Scoliosis    Serotonin syndrome     Past Surgical History:  Procedure Laterality Date   [place]    Prior to Admission medications   Medication Sig Start Date End Date Taking? Authorizing Provider  ALPRAZolam Duanne Moron) 0.25 MG tablet TAKE 1 TABLET BY MOUTH TWICE DAILY AS NEEDED FOR ANXITEY 08/19/21  Yes Lesleigh Noe, MD  cyclobenzaprine (FLEXERIL) 10 MG tablet Take 1 tablet (10 mg total) by mouth at bedtime as needed. 06/09/21  Yes Lesleigh Noe, MD  dicyclomine (BENTYL) 10 MG capsule TAKE 1 CAPSULE BY MOUTH EVERY 6  HOURS AS NEEDED 11/07/21  Yes Lesleigh Noe, MD  hydrocortisone (ANUSOL-HC) 25 MG suppository Place 1 suppository (25 mg total) rectally as needed. UNWRAP AND INSERT 1 SUPPOSITORY (25 MG TOTAL) RECTALLY 2 (TWO) TIMES DAILY. 06/09/21  Yes Lesleigh Noe, MD  metoprolol tartrate (LOPRESSOR) 25 MG tablet Take 25 mg by mouth 2 (two) times daily. 11/23/21  Yes [provider]  pantoprazole (PROTONIX) 40 MG tablet TAKE 1 TABLET BY MOUTH EVERY DAY AS NEEDED FOR GERD\ 11/07/21  Yes Lesleigh Noe, MD  promethazine (PHENERGAN) 12.5 MG tablet TAKE 1 TABLET(12.5 MG) BY MOUTH EVERY 6 HOURS AS NEEDED FOR NAUSEA OR VOMITING 06/09/21  Yes Lesleigh Noe, MD  promethazine (PHENERGAN) 25 MG suppository Place 1 suppository (25 mg total) rectally every 6 (six) hours as needed for nausea. 09/30/18 01/31/23 Yes Earleen Newport, MD  traMADol (ULTRAM) 50 MG tablet TAKE 1 TABLET BY MOUTH EVERY 8 HOURS 06/09/21  Yes Lesleigh Noe, MD    Allergies  Allergen Reactions   Amoxicillin Nausea Only   Cymbalta [Duloxetine Hcl]     SOB, palpitations, nausea   Other     Cannot take anything extended release per pt   Tegretol [Carbamazepine]     Made her ill    Gynecologic History: No LMP recorded. (Menstrual status: Perimenopausal).  Obstetric History: G1P0  Social History   Socioeconomic History   Marital status: Married    Spouse name: Annie Main   Number of children: 2   Years of education: 12   Highest education level: High school graduate  Occupational History   Occupation: UNEMPLOYED    Fish farm manager: UNEMPLOYED    Comment: trying to get disability  Tobacco Use   Smoking status: Never   Smokeless tobacco: Never  Vaping Use   Vaping Use: Never used  Substance and Sexual Activity   Alcohol use: Not Currently   Drug use: No   Sexual activity: Yes    Partners: Male    Birth control/protection: None, Patch  Other Topics Concern   Not on file  Social History Narrative   02/10/20   From: From  [place], [place]   Living: with husband stephen and one son   Work: no, disability       Family: 2 sons - Denyse Amass and Lysbeth Galas (twin boys 1995)      Enjoys: reading, play banjo, crochet      Exercise: just got an exercise bike, and planning to walk when the weather is nicer   Diet: drinks diet soda, eats yogurt, trying to limit red meat - eating beyond beef      Safety   Seat belts: Yes    Guns: No   Safe in relationships: Yes    Social Determinants of Radio broadcast assistant Strain: Not on file  Food Insecurity: Not  on file  Transportation Needs: Not on file  Physical Activity: Not on file  Stress: Not on file  Social Connections: Not on file  Intimate Partner Violence: Not on file    Family History  Problem Relation Age of Onset   Depression Mother    Anxiety disorder Mother    Osteoarthritis Mother    Brain cancer Father        GBM   Alcohol abuse Sister    Anxiety disorder Sister    Diabetes Brother    Diverticulitis Brother    Heart disease Brother    Alcohol abuse Brother    Healthy Brother    Arrhythmia Paternal Uncle    Brain cancer Paternal Grandfather        GBM   Lung cancer Maternal Grandmother    Lung cancer Maternal Grandfather    Brain cancer Paternal Aunt    Colon cancer Neg Hx    Breast cancer Neg Hx    Ovarian cancer Neg Hx     Review of Systems  Constitutional:  Negative for chills and fever.  HENT:  Negative for congestion and sore throat.   Eyes:  Negative for blurred vision and double vision.  Respiratory:  Negative for shortness of breath.   Cardiovascular:  Negative for chest pain.  Gastrointestinal:  Negative for heartburn, nausea and vomiting.  Genitourinary: Negative.   Musculoskeletal: Negative.  Negative for myalgias.  Skin:  Negative for rash.  Neurological:  Negative for dizziness and headaches.  Endo/Heme/Allergies:  Does not bruise/bleed easily.  Psychiatric/Behavioral:  Negative for depression. The patient is not  nervous/anxious.     Physical Exam BP 122/80    Pulse 79    Temp 98.9 F (37.2 C) (Oral)    Ht 5' 3.5" (1.613 m)    Wt 197 lb 4 oz (89.5 kg)    SpO2 98%    BMI 34.39 kg/m    BP Readings from Last 3 Encounters:  01/30/22 122/80  06/09/21 122/68  08/17/20 118/80      Physical Exam Constitutional:      General: She is not in acute distress.    Appearance: She is well-developed. She is not diaphoretic.  HENT:     Head: Normocephalic and atraumatic.     Right Ear: External ear normal.     Left Ear: External ear normal.     Nose: Nose normal.  Eyes:     General: No scleral icterus.    Extraocular Movements: Extraocular movements intact.     Conjunctiva/sclera: Conjunctivae normal.  Cardiovascular:     Rate and Rhythm: Normal rate and regular rhythm.     Heart sounds: No murmur heard. Pulmonary:     Effort: Pulmonary effort is normal. No respiratory distress.     Breath sounds: Normal breath sounds. No wheezing.  Abdominal:     General: Bowel sounds are normal. There is no distension.     Palpations: Abdomen is soft. There is no mass.     Tenderness: There is no abdominal tenderness. There is no guarding or rebound.  Musculoskeletal:        General: Normal range of motion.     Cervical back: Neck supple.  Lymphadenopathy:     Cervical: No cervical adenopathy.  Skin:    General: Skin is warm and dry.     Capillary Refill: Capillary refill takes less than 2 seconds.     Comments: Scattered erythematous patches with a few papules  Neurological:  Mental Status: She is alert and oriented to person, place, and time.     Deep Tendon Reflexes: Reflexes normal.  Psychiatric:        Mood and Affect: Mood normal.        Behavior: Behavior normal.    Results:  PHQ-9:  [place] Office Visit from 01/30/2022 in [place] at [place]  PHQ-9 Total Score 8         Assessment: 61 y.o. G1P0 female here for routine annual physical  examination.  Plan: Problem List Items Addressed This Visit       Digestive   Gastroesophageal reflux disease   Relevant Medications   pantoprazole (PROTONIX) 40 MG tablet     Musculoskeletal and Integument   Dermatitis    Patchy erythematous rash with a few papular lesions.  Advised trial of triamcinolone cream if not improving will refer to dermatology.      Relevant Medications   triamcinolone cream (KENALOG) 0.1 %     Other   Fibromyalgia    Has not run out of tramadol due to back stock, taking 50 mg 2-3 times a day.  Last prescription was number 80/month.  Refill provided return in 6 months.      Relevant Medications   traMADol (ULTRAM) 50 MG tablet   MDD (major depressive disorder), recurrent severe, without psychosis ([place])   Relevant Medications   ALPRAZolam (XANAX) 0.25 MG tablet   GAD (generalized anxiety disorder)    Was taking approximately 30 Xanax per month, however has not had a prescription since September.  Discussed working to minimize use to 60 tablets every 6 months.      Relevant Medications   ALPRAZolam (XANAX) 0.25 MG tablet   Sinus tachycardia    Stable. Taking metoprolol tartrate 25 mg 2-3 times daily. Refill. Will request cards records      Relevant Medications   metoprolol tartrate (LOPRESSOR) 25 MG tablet   Other Relevant Orders   Comprehensive metabolic panel   CBC   Other Visit Diagnoses     Annual physical exam    -  Primary   Relevant Orders   Lipid panel   Ferritin   TSH   Screening for colon cancer       Relevant Orders   Cologuard   Vitamin D deficiency       Relevant Orders   Vitamin D, 25-hydroxy       Screening: -- Blood pressure screen normal -- cholesterol screening: will obtain -- Weight screening: overweight: continue to monitor -- Diabetes Screening: will obtain -- Nutrition: Encouraged healthy diet  The 10-year ASCVD risk score (Arnett DK, et al., 2019) is: 4.5%   Values used to calculate the score:      Age: 65 years     Sex: Female     Is Non-Hispanic African American: No     Diabetic: No     Tobacco smoker: No     Systolic Blood Pressure: 242 mmHg     Is BP treated: No     HDL Cholesterol: 37.8 mg/dL     Total Cholesterol: 290 mg/dL  -- Statin therapy for Age 23-75 with CVD risk >7.5%  Psych -- Depression screening (PHQ-9):  [place] Visit from 01/30/2022 in [place] at [place]  PHQ-9 Total Score 8        Safety -- tobacco screening: not using -- alcohol screening:  low-risk usage. -- no evidence of domestic violence or intimate partner violence.  Cancer Screening -- pap smear not collected per ASCCP guidelines -- family history of breast cancer screening: done. not at high risk. -- Mammogram -  request records -- Colon cancer (age 78+)-- ordered  Immunizations Immunization History  Administered Date(s) Administered   Influenza,inj,Quad PF,6+ Mos 09/04/2013, 01/05/2016, 08/23/2017, 09/19/2018, 08/17/2020, 09/16/2021   Influenza-Unspecified 10/12/2019   Moderna Covid-19 Vaccine Bivalent Booster 35yr & up 09/16/2021   PFIZER(Purple Top)SARS-COV-2 Vaccination 02/22/2020, 03/16/2020, 05/20/2021    -- flu vaccine up to date -- TDAP q10 years unknown, record requested -- Shingles (age >>80 unknown, record requested  -- Covid-19 Vaccine - up to date   Encouraged healthy diet and exercise. Encouraged regular vision and dental care.    JLesleigh Noe MD

## 2022-01-30 NOTE — Patient Instructions (Signed)
Work to reduce your regular use of tramadol and xanax ? ?Return in 6 months ? ?Labs today ?

## 2022-01-30 NOTE — Assessment & Plan Note (Signed)
Stable. Taking metoprolol tartrate 25 mg 2-3 times daily. Refill. Will request cards records ?

## 2022-01-30 NOTE — Assessment & Plan Note (Signed)
Was taking approximately 30 Xanax per month, however has not had a prescription since September.  Discussed working to minimize use to 60 tablets every 6 months. ?

## 2022-01-30 NOTE — Assessment & Plan Note (Signed)
Has not run out of tramadol due to back stock, taking 50 mg 2-3 times a day.  Last prescription was number 80/month.  Refill provided return in 6 months. ?

## 2022-01-30 NOTE — Assessment & Plan Note (Signed)
Patchy erythematous rash with a few papular lesions.  Advised trial of triamcinolone cream if not improving will refer to dermatology. ?

## 2022-01-31 LAB — LIPID PANEL
Cholesterol: 232 mg/dL — ABNORMAL HIGH (ref 0–200)
HDL: 42.3 mg/dL (ref >39.00)
Total CHOL/HDL Ratio: 5
Triglycerides: 527 mg/dL — ABNORMAL HIGH (ref 0.0–149.0)

## 2022-01-31 LAB — COMPREHENSIVE METABOLIC PANEL
ALT: 13 U/L (ref 0–35)
AST: 18 U/L (ref 0–37)
Albumin: 4.4 g/dL (ref 3.5–5.2)
Alkaline Phosphatase: 85 U/L (ref 39–117)
BUN: 10 mg/dL (ref 6–23)
CO2: 28 mEq/L (ref 19–32)
Calcium: 9.5 mg/dL (ref 8.4–10.5)
Chloride: 102 mEq/L (ref 96–112)
Creatinine, Ser: 1.11 mg/dL (ref 0.40–1.20)
GFR: 54.75 mL/min — ABNORMAL LOW (ref >60.00)
Glucose, Bld: 88 mg/dL (ref 70–99)
Potassium: 4.6 mEq/L (ref 3.5–5.1)
Sodium: 138 mEq/L (ref 135–145)
Total Bilirubin: 1.1 mg/dL (ref 0.2–1.2)
Total Protein: 7.5 g/dL (ref 6.0–8.3)

## 2022-01-31 LAB — CBC
HCT: 37.7 % (ref 36.0–46.0)
Hemoglobin: 12.8 g/dL (ref 12.0–15.0)
MCHC: 34 g/dL (ref 30.0–36.0)
MCV: 86.6 fl (ref 78.0–100.0)
Platelets: 281 10*3/uL (ref 150.0–400.0)
RBC: 4.35 Mil/uL (ref 3.87–5.11)
RDW: 13.8 % (ref 11.5–15.5)
WBC: 5.9 10*3/uL (ref 4.0–10.5)

## 2022-01-31 LAB — VITAMIN D 25 HYDROXY (VIT D DEFICIENCY, FRACTURES): VITD: 36.34 ng/mL (ref 30.00–100.00)

## 2022-01-31 LAB — LDL CHOLESTEROL, DIRECT: Direct LDL: 73 mg/dL

## 2022-01-31 LAB — FERRITIN: Ferritin: 14 ng/mL (ref 10.0–291.0)

## 2022-01-31 LAB — TSH: TSH: 2.17 u[IU]/mL (ref 0.35–5.50)

## 2022-02-07 ENCOUNTER — Other Ambulatory Visit: Payer: Self-pay | Admitting: Family Medicine

## 2022-02-07 DIAGNOSIS — K219 Gastro-esophageal reflux disease without esophagitis: Secondary | ICD-10-CM

## 2022-02-17 LAB — COLOGUARD: COLOGUARD: NEGATIVE

## 2022-02-27 ENCOUNTER — Other Ambulatory Visit: Payer: Self-pay | Admitting: Family Medicine

## 2022-02-27 DIAGNOSIS — R000 Tachycardia, unspecified: Secondary | ICD-10-CM

## 2022-04-13 ENCOUNTER — Encounter: Payer: Self-pay | Admitting: Family Medicine

## 2022-04-22 ENCOUNTER — Encounter: Payer: Self-pay | Admitting: Family Medicine

## 2022-04-22 DIAGNOSIS — M797 Fibromyalgia: Secondary | ICD-10-CM

## 2022-04-22 DIAGNOSIS — F411 Generalized anxiety disorder: Secondary | ICD-10-CM

## 2022-04-25 MED ORDER — TRAMADOL HCL 50 MG PO TABS: 50.0000 mg | ORAL_TABLET | Freq: Three times a day (TID) | ORAL | 3 refills | Status: DC | PRN

## 2022-04-25 MED ORDER — ALPRAZOLAM 0.25 MG PO TABS: 0.2500 mg | ORAL_TABLET | Freq: Every day | ORAL | 1 refills | Status: DC | PRN

## 2022-04-26 ENCOUNTER — Encounter: Payer: Self-pay | Admitting: Family Medicine

## 2022-04-26 ENCOUNTER — Other Ambulatory Visit: Payer: Self-pay | Admitting: Family Medicine

## 2022-04-26 DIAGNOSIS — K219 Gastro-esophageal reflux disease without esophagitis: Secondary | ICD-10-CM

## 2022-04-26 MED ORDER — PANTOPRAZOLE SODIUM 40 MG PO TBEC: DELAYED_RELEASE_TABLET | ORAL | 0 refills | Status: DC

## 2022-05-04 ENCOUNTER — Encounter: Payer: Self-pay | Admitting: Family Medicine

## 2022-05-04 DIAGNOSIS — M797 Fibromyalgia: Secondary | ICD-10-CM

## 2022-05-04 DIAGNOSIS — F411 Generalized anxiety disorder: Secondary | ICD-10-CM

## 2022-05-05 NOTE — Telephone Encounter (Signed)
Change in pharmacy, again. See message.

## 2022-05-08 MED ORDER — ALPRAZOLAM 0.25 MG PO TABS: 0.2500 mg | ORAL_TABLET | Freq: Every day | ORAL | 1 refills | Status: DC | PRN

## 2022-05-08 MED ORDER — TRAMADOL HCL 50 MG PO TABS: 50.0000 mg | ORAL_TABLET | Freq: Three times a day (TID) | ORAL | 3 refills | Status: DC | PRN

## 2022-05-19 ENCOUNTER — Telehealth: Payer: Self-pay

## 2022-08-02 ENCOUNTER — Encounter: Payer: Self-pay | Admitting: Nurse Practitioner

## 2022-08-02 ENCOUNTER — Ambulatory Visit: Payer: BC Managed Care – PPO | Admitting: Nurse Practitioner

## 2022-08-02 ENCOUNTER — Other Ambulatory Visit: Payer: Self-pay | Admitting: Nurse Practitioner

## 2022-08-02 VITALS — BP 118/68 | HR 89 | Temp 98.4°F | Ht 63.0 in | Wt 194.0 lb

## 2022-08-02 DIAGNOSIS — Z23 Encounter for immunization: Secondary | ICD-10-CM | POA: Diagnosis not present

## 2022-08-02 DIAGNOSIS — M797 Fibromyalgia: Secondary | ICD-10-CM

## 2022-08-02 DIAGNOSIS — E559 Vitamin D deficiency, unspecified: Secondary | ICD-10-CM

## 2022-08-02 DIAGNOSIS — R000 Tachycardia, unspecified: Secondary | ICD-10-CM

## 2022-08-02 DIAGNOSIS — F411 Generalized anxiety disorder: Secondary | ICD-10-CM

## 2022-08-02 DIAGNOSIS — K219 Gastro-esophageal reflux disease without esophagitis: Secondary | ICD-10-CM

## 2022-08-02 DIAGNOSIS — Z6834 Body mass index (BMI) 34.0-34.9, adult: Secondary | ICD-10-CM

## 2022-08-02 DIAGNOSIS — D508 Other iron deficiency anemias: Secondary | ICD-10-CM

## 2022-08-02 MED ORDER — METOPROLOL TARTRATE 25 MG PO TABS: 25.0000 mg | ORAL_TABLET | Freq: Two times a day (BID) | ORAL | 1 refills | Status: DC

## 2022-08-02 MED ORDER — ALPRAZOLAM 0.25 MG PO TABS: 0.2500 mg | ORAL_TABLET | Freq: Every day | ORAL | 1 refills | Status: DC | PRN

## 2022-08-02 MED ORDER — PANTOPRAZOLE SODIUM 40 MG PO TBEC: DELAYED_RELEASE_TABLET | ORAL | 1 refills | Status: DC

## 2022-08-02 NOTE — Progress Notes (Signed)
New Patient Office Visit  Subjective    Patient ID: Margaret Wood, female    DOB: 09-26-1961  Age: 61 y.o. MRN: [id number]  CC:  Chief Complaint  Patient presents with   New to establish    HPI Margaret Wood presents to establish care. This is her initial visit to the office. Pt has a history of fibromyalgia, GERD, and depression. Wears corrective lenses, up to date on colon cancer screening, mammogram, and eye exam.   Anxiety  Current treatment includes Xanax 0.25 mg QD.  She reports good compliance with treatment. She reports good tolerance of treatment. She is not having side effects.          She feels her anxiety is moderate and Unchanged since last visit.   GAD-7 Results    01/30/2022    3:59 PM 11/05/2019    5:13 PM 10/14/2018   10:35 AM  GAD-7 Generalized Anxiety Disorder Screening Tool  1. Feeling Nervous, Anxious, or on Edge 1 2 2   2. Not Being Able to Stop or Control Worrying 1 1 1   3. Worrying Too Much About Different Things 1 1 1   4. Trouble Relaxing 1 2 2   5. Being So Restless it's Hard To Sit Still 0 1 1  6. Becoming Easily Annoyed or Irritable 1 2 2   7. Feeling Afraid As If Something Awful Might Happen 1 1 2   Total GAD-7 Score 6 10 11   Difficulty At Work, Home, or Getting  Along With Others? Somewhat difficult Somewhat difficult Somewhat difficult    PHQ-9 Scores    08/02/2022    2:16 PM 01/30/2022    3:59 PM 02/10/2020    2:44 PM  PHQ9 SCORE ONLY  PHQ-9 Total Score 10 8 7           Outpatient Encounter Medications as of 08/02/2022  Medication Sig   ALPRAZolam (XANAX) 0.25 MG tablet Take 1 tablet (0.25 mg total) by mouth daily as needed for anxiety.   cyclobenzaprine (FLEXERIL) 10 MG tablet Take 1 tablet (10 mg total) by mouth at bedtime as needed.   dicyclomine (BENTYL) 10 MG capsule TAKE 1 CAPSULE BY MOUTH EVERY 6 HOURS AS NEEDED   hydrocortisone (ANUSOL-HC) 25 MG suppository Place 1 suppository (25 mg total) rectally as needed. UNWRAP AND  INSERT 1 SUPPOSITORY (25 MG TOTAL) RECTALLY 2 (TWO) TIMES DAILY.   metoprolol tartrate (LOPRESSOR) 25 MG tablet Take 1 tablet (25 mg total) by mouth 2 (two) times daily.   pantoprazole (PROTONIX) 40 MG tablet TAKE 1 TABLET BY MOUTH EVERY DAY AS NEEDED FOR GERD   promethazine (PHENERGAN) 12.5 MG tablet TAKE 1 TABLET(12.5 MG) BY MOUTH EVERY 6 HOURS AS NEEDED FOR NAUSEA OR VOMITING   promethazine (PHENERGAN) 25 MG suppository Place 1 suppository (25 mg total) rectally every 6 (six) hours as needed for nausea.   traMADol (ULTRAM) 50 MG tablet Take 1 tablet (50 mg total) by mouth 3 (three) times daily as needed for moderate pain or severe pain. TAKE 1 TABLET BY MOUTH EVERY 8 HOURS   [DISCONTINUED] triamcinolone cream (KENALOG) 0.1 % Apply 1 application. topically 2 (two) times daily.   [DISCONTINUED] 0.9 %  sodium chloride infusion    No facility-administered encounter medications on file as of 08/02/2022.    Past Medical History:  Diagnosis Date   Anxiety    Arthritis    Depression    Fibromyalgia    Gallstones    H/O [facility] spotted fever  IBS (irritable bowel syndrome)    Mitral valve prolapse    Palpitations    Scoliosis    Serotonin syndrome     Past Surgical History:  Procedure Laterality Date   APPENDECTOMY  1980   CESAREAN SECTION  1995   CHOLECYSTECTOMY  1997    Family History  Problem Relation Age of Onset   Depression Mother    Anxiety disorder Mother    Osteoarthritis Mother    Brain cancer Father        GBM   Alcohol abuse Sister    Anxiety disorder Sister    Diabetes Brother    Diverticulitis Brother    Heart disease Brother    Alcohol abuse Brother    Healthy Brother    Arrhythmia Paternal Uncle    Brain cancer Paternal Grandfather        GBM   Lung cancer Maternal Grandmother    Lung cancer Maternal Grandfather    Brain cancer Paternal Aunt    Colon cancer Neg Hx    Breast cancer Neg Hx    Ovarian cancer Neg Hx     Social History    Socioeconomic History   Marital status: Married    Spouse name: Jeannett Senior   Number of children: 2   Years of education: 12   Highest education level: High school graduate  Occupational History   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  Tobacco Use   Smoking status: Never   Smokeless tobacco: Never  Vaping Use   Vaping Use: Never used  Substance and Sexual Activity   Alcohol use: Not Currently   Drug use: No   Sexual activity: Yes    Partners: Male    Birth control/protection: None, Patch  Other Topics Concern   Not on file  Social History Narrative   02/10/20   From: From [place], [place]   Living: with husband stephen and one son   Work: no, disability       Family: 2 sons - Verdon Cummins and Sheria Lang (twin boys 1995)      Enjoys: reading, play banjo, crochet      Exercise: just got an exercise bike, and planning to walk when the weather is nicer   Diet: drinks diet soda, eats yogurt, trying to limit red meat - eating beyond beef      Safety   Seat belts: Yes    Guns: No   Safe in relationships: Yes    Social Determinants of Health   Financial Resource Strain: Low Risk  (04/15/2018)   Overall Financial Resource Strain (CARDIA)    Difficulty of Paying Living Expenses: Not hard at all  Food Insecurity: No Food Insecurity (04/15/2018)   Hunger Vital Sign    Worried About Running Out of Food in the Last Year: Never true    Ran Out of Food in the Last Year: Never true  Transportation Needs: No Transportation Needs (04/15/2018)   PRAPARE - Administrator, Civil Service (Medical): No    Lack of Transportation (Non-Medical): No  Physical Activity: Inactive (04/15/2018)   Exercise Vital Sign    Days of Exercise per Week: 0 days    Minutes of Exercise per Session: 0 min  Stress: Stress Concern Present (04/15/2018)   [employer] of Occupational Health - Occupational Stress Questionnaire    Feeling of Stress : Very much   Social Connections: Unknown (04/15/2018)   Social Connection and Isolation Panel [  NHANES]    Frequency of Communication with Friends and Family: Not on file    Frequency of Social Gatherings with Friends and Family: Not on file    Attends Religious Services: Never    Active Member of Clubs or Organizations: No    Attends Archivist Meetings: Never    Marital Status: Married  Human resources officer Violence: Not At Risk (04/15/2018)   Humiliation, Afraid, Rape, and Kick questionnaire    Fear of Current or Ex-Partner: No    Emotionally Abused: No    Physically Abused: No    Sexually Abused: No    Review of Systems  Constitutional:  Negative for fever and malaise/fatigue.  HENT:  Negative for ear pain, sinus pain and sore throat.   Respiratory:  Negative for cough, shortness of breath and wheezing.   Cardiovascular:  Negative for chest pain and leg swelling.  Gastrointestinal:  Negative for abdominal pain, constipation, diarrhea, heartburn, nausea and vomiting.  Genitourinary:  Negative for dysuria, frequency and urgency.  Musculoskeletal:  Positive for myalgias (chronic fibromyalgia). Negative for back pain, falls and neck pain.  Neurological:  Negative for dizziness, weakness and headaches.  Endo/Heme/Allergies:  Positive for environmental allergies.  Psychiatric/Behavioral:  Negative for depression. The patient is not nervous/anxious.         Objective    BP 118/68 (BP Location: Left Arm, Patient Position: Sitting)   Pulse 89   Temp 98.4 F (36.9 C) (Temporal)   Ht 5\' 3"  (1.6 m)   Wt 194 lb (88 kg)   SpO2 98%   BMI 34.37 kg/m   Physical Exam Vitals reviewed.  Constitutional:      Appearance: Normal appearance.  HENT:     Right Ear: Tympanic membrane normal.     Left Ear: Tympanic membrane normal.     Nose: Nose normal.     Mouth/Throat:     Mouth: Mucous membranes are moist.  Eyes:     Pupils: Pupils are equal, round, and reactive to light.     Comments:  Eye glasses in place  Cardiovascular:     Rate and Rhythm: Normal rate and regular rhythm.     Heart sounds: Normal heart sounds.  Pulmonary:     Effort: Pulmonary effort is normal.     Breath sounds: Normal breath sounds.  Abdominal:     General: Bowel sounds are normal.     Palpations: Abdomen is soft.  Musculoskeletal:        General: Normal range of motion.     Cervical back: Neck supple.  Skin:    General: Skin is warm and dry.     Capillary Refill: Capillary refill takes less than 2 seconds.  Neurological:     General: No focal deficit present.     Mental Status: She is alert and oriented to person, place, and time.  Psychiatric:        Mood and Affect: Mood normal.        Behavior: Behavior normal.        Assessment & Plan:   1. Gastroesophageal reflux disease, unspecified whether esophagitis present-well controlled - CBC with Differential/Platelet - Comprehensive metabolic panel  2. Fibromyalgia-well controlled - CBC with Differential/Platelet - Comprehensive metabolic panel -continue Tramadol as prescribed  3. Sinus tachycardia-well controlled - CBC with Differential/Platelet - Comprehensive metabolic panel - T4, free - TSH -continue Metoprolol 25 mg BID  4. Vitamin D deficiency - VITAMIN D 25 Hydroxy (Vit-D Deficiency, Fractures) -continue Vit D supplement -continue  Vit D rich diet  5. Other iron deficiency anemia - CBC with Differential/Platelet - Comprehensive metabolic panel  6. BMI 34.0-34.9,adult - CBC with Differential/Platelet - Comprehensive metabolic panel - T4, free - TSH - VITAMIN D 25 Hydroxy (Vit-D Deficiency, Fractures) - Lipid panel   Continue medications We will call you with lab results Shingrix vaccine given in office today Return in 2 months for second Shingrix vaccine Follow-up in 9-months, fasting  I,Lauren M Auman,acting as a scribe for [employer], NP.,have documented all relevant documentation on the behalf of  Rip Harbour, NP,as directed by  Rip Harbour, NP while in the presence of Rip Harbour, NP.   Oleta Mouse, CMA  I, Rip Harbour, NP, have reviewed all documentation for this visit. The documentation on 08/02/22 for the exam, diagnosis, procedures, and orders are all accurate and complete.   Follow-up: 88-months, fasting  Signed, Jerrell Belfast, DNP 08/02/22 at 2:42 pm

## 2022-08-02 NOTE — Patient Instructions (Addendum)
Continue medications We will call you with lab results Shingrix vaccine given in office today Return in 2 months for second Shingrix vaccine Follow-up in 26-month, fasting  Preventive Care [id number] Old, Female Preventive care refers to lifestyle choices and visits with your health care provider that can promote health and wellness. Preventive care visits are also called wellness exams. What can I expect for my preventive care visit? Counseling Your health care provider may ask you questions about your: Medical history, including: Past medical problems. Family medical history. Pregnancy history. Current health, including: Menstrual cycle. Method of birth control. Emotional well-being. Home life and relationship well-being. Sexual activity and sexual health. Lifestyle, including: Alcohol, nicotine or tobacco, and drug use. Access to firearms. Diet, exercise, and sleep habits. Work and work eStatistician Sunscreen use. Safety issues such as seatbelt and bike helmet use. Physical exam Your health care provider will check your: Height and weight. These may be used to calculate your BMI (body mass index). BMI is a measurement that tells if you are at a healthy weight. Waist circumference. This measures the distance around your waistline. This measurement also tells if you are at a healthy weight and may help predict your risk of certain diseases, such as type 2 diabetes and high blood pressure. Heart rate and blood pressure. Body temperature. Skin for abnormal spots. What immunizations do I need?  Vaccines are usually given at various ages, according to a schedule. Your health care provider will recommend vaccines for you based on your age, medical history, and lifestyle or other factors, such as travel or where you work. What tests do I need? Screening Your health care provider may recommend screening tests for certain conditions. This may include: Lipid and cholesterol  levels. Diabetes screening. This is done by checking your blood sugar (glucose) after you have not eaten for a while (fasting). Pelvic exam and Pap test. Hepatitis B test. Hepatitis C test. HIV (human immunodeficiency virus) test. STI (sexually transmitted infection) testing, if you are at risk. Lung cancer screening. Colorectal cancer screening. Mammogram. Talk with your health care provider about when you should start having regular mammograms. This may depend on whether you have a family history of breast cancer. BRCA-related cancer screening. This may be done if you have a family history of breast, ovarian, tubal, or peritoneal cancers. Bone density scan. This is done to screen for osteoporosis. Talk with your health care provider about your test results, treatment options, and if necessary, the need for more tests. Follow these instructions at home: Eating and drinking  Eat a diet that includes fresh fruits and vegetables, whole grains, lean protein, and low-fat dairy products. Take vitamin and mineral supplements as recommended by your health care provider. Do not drink alcohol if: Your health care provider tells you not to drink. You are pregnant, may be pregnant, or are planning to become pregnant. If you drink alcohol: Limit how much you have to 0-1 drink a day. Know how much alcohol is in your drink. In the [place] one drink equals one 12 oz bottle of beer (355 mL), one 5 oz glass of wine (148 mL), or one 1 oz glass of hard liquor (44 mL). Lifestyle Brush your teeth every morning and night with fluoride toothpaste. Floss one time each day. Exercise for at least 30 minutes 5 or more days each week. Do not use any products that contain nicotine or tobacco. These products include cigarettes, chewing tobacco, and vaping devices, such as e-cigarettes. If you need  help quitting, ask your health care provider. Do not use drugs. If you are sexually active, practice safe sex. Use a  condom or other form of protection to prevent STIs. If you do not wish to become pregnant, use a form of birth control. If you plan to become pregnant, see your health care provider for a prepregnancy visit. Take aspirin only as told by your health care provider. Make sure that you understand how much to take and what form to take. Work with your health care provider to find out whether it is safe and beneficial for you to take aspirin daily. Find healthy ways to manage stress, such as: Meditation, yoga, or listening to music. Journaling. Talking to a trusted person. Spending time with friends and family. Minimize exposure to UV radiation to reduce your risk of skin cancer. Safety Always wear your seat belt while driving or riding in a vehicle. Do not drive: If you have been drinking alcohol. Do not ride with someone who has been drinking. When you are tired or distracted. While texting. If you have been using any mind-altering substances or drugs. Wear a helmet and other protective equipment during sports activities. If you have firearms in your house, make sure you follow all gun safety procedures. Seek help if you have been physically or sexually abused. What's next? Visit your health care provider once a year for an annual wellness visit. Ask your health care provider how often you should have your eyes and teeth checked. Stay up to date on all vaccines. This information is not intended to replace advice given to you by your health care provider. Make sure you discuss any questions you have with your health care provider. Document Revised: 05/11/2021 Document Reviewed: 05/11/2021 Elsevier Patient Education  [employer].  Zoster Vaccine, Recombinant injection What is this medication? ZOSTER VACCINE (ZOS ter vak SEEN) is a vaccine used to reduce the risk of getting shingles. This vaccine is not used to treat shingles or nerve pain from shingles. This medicine may be used for  other purposes; ask your health care provider or pharmacist if you have questions. COMMON BRAND NAME(S): [facility] What should I tell my care team before I take this medication? They need to know if you have any of these conditions: cancer immune system problems an unusual or allergic reaction to Zoster vaccine, other medications, foods, dyes, or preservatives pregnant or trying to get pregnant breast-feeding How should I use this medication? This vaccine is injected into a muscle. It is given by a health care provider. A copy of Vaccine Information Statements will be given before each vaccination. Be sure to read this information carefully each time. This sheet may change often. Talk to your health care provider about the use of this vaccine in children. This vaccine is not approved for use in children. Overdosage: If you think you have taken too much of this medicine contact a poison control center or emergency room at once. NOTE: This medicine is only for you. Do not share this medicine with others. What if I miss a dose? Keep appointments for follow-up (booster) doses. It is important not to miss your dose. Call your health care provider if you are unable to keep an appointment. What may interact with this medication? medicines that suppress your immune system medicines to treat cancer steroid medicines like prednisone or cortisone This list may not describe all possible interactions. Give your health care provider a list of all the medicines, herbs, non-prescription drugs,  or dietary supplements you use. Also tell them if you smoke, drink alcohol, or use illegal drugs. Some items may interact with your medicine. What should I watch for while using this medication? Visit your health care provider regularly. This vaccine, like all vaccines, may not fully protect everyone. What side effects may I notice from receiving this medication? Side effects that you should report to your doctor or  health care professional as soon as possible: allergic reactions (skin rash, itching or hives; swelling of the face, lips, or tongue) trouble breathing Side effects that usually do not require medical attention (report these to your doctor or health care professional if they continue or are bothersome): chills headache fever nausea pain, redness, or irritation at site where injected tiredness vomiting This list may not describe all possible side effects. Call your doctor for medical advice about side effects. You may report side effects to FDA at 1-800-FDA-1088. Where should I keep my medication? This vaccine is only given by a health care provider. It will not be stored at home. NOTE: This sheet is a summary. It may not cover all possible information. If you have questions about this medicine, talk to your doctor, pharmacist, or health care provider.  2023 Elsevier/Gold Standard (2021-10-14 00:00:00)

## 2022-08-03 ENCOUNTER — Other Ambulatory Visit: Payer: Self-pay

## 2022-08-03 ENCOUNTER — Other Ambulatory Visit: Payer: Self-pay | Admitting: Nurse Practitioner

## 2022-08-03 LAB — LIPID PANEL
Chol/HDL Ratio: 6.3 ratio — ABNORMAL HIGH (ref 0.0–4.4)
Cholesterol, Total: 270 mg/dL — ABNORMAL HIGH (ref 100–199)
HDL: 43 mg/dL (ref >39)
LDL Chol Calc (NIH): 127 mg/dL — ABNORMAL HIGH (ref 0–99)
Triglycerides: 555 mg/dL (ref 0–149)
VLDL Cholesterol Cal: 100 mg/dL — ABNORMAL HIGH (ref 5–40)

## 2022-08-03 LAB — CBC WITH DIFFERENTIAL/PLATELET
Basophils Absolute: 0.1 10*3/uL (ref 0.0–0.2)
Basos: 2 %
EOS (ABSOLUTE): 0.2 10*3/uL (ref 0.0–0.4)
Eos: 5 %
Hematocrit: 39.7 % (ref 34.0–46.6)
Hemoglobin: 13.5 g/dL (ref 11.1–15.9)
Immature Grans (Abs): 0 10*3/uL (ref 0.0–0.1)
Immature Granulocytes: 0 %
Lymphocytes Absolute: 1.8 10*3/uL (ref 0.7–3.1)
Lymphs: 37 %
MCH: 30.2 pg (ref 26.6–33.0)
MCHC: 34 g/dL (ref 31.5–35.7)
MCV: 89 fL (ref 79–97)
Monocytes Absolute: 0.4 10*3/uL (ref 0.1–0.9)
Monocytes: 9 %
Neutrophils Absolute: 2.4 10*3/uL (ref 1.4–7.0)
Neutrophils: 47 %
Platelets: 258 10*3/uL (ref 150–450)
RBC: 4.47 x10E6/uL (ref 3.77–5.28)
RDW: 13.7 % (ref 11.7–15.4)
WBC: 4.9 10*3/uL (ref 3.4–10.8)

## 2022-08-03 LAB — COMPREHENSIVE METABOLIC PANEL
ALT: 15 IU/L (ref 0–32)
AST: 22 IU/L (ref 0–40)
Albumin/Globulin Ratio: 1.7 (ref 1.2–2.2)
Albumin: 4.7 g/dL (ref 3.8–4.9)
Alkaline Phosphatase: 89 IU/L (ref 44–121)
BUN/Creatinine Ratio: 8 — ABNORMAL LOW (ref 9–23)
BUN: 9 mg/dL (ref 6–24)
Bilirubin Total: 1 mg/dL (ref 0.0–1.2)
CO2: 24 mmol/L (ref 20–29)
Calcium: 9.9 mg/dL (ref 8.7–10.2)
Chloride: 102 mmol/L (ref 96–106)
Creatinine, Ser: 1.11 mg/dL — ABNORMAL HIGH (ref 0.57–1.00)
Globulin, Total: 2.8 g/dL (ref 1.5–4.5)
Glucose: 93 mg/dL (ref 70–99)
Potassium: 4.4 mmol/L (ref 3.5–5.2)
Sodium: 138 mmol/L (ref 134–144)
Total Protein: 7.5 g/dL (ref 6.0–8.5)
eGFR: 57 mL/min/{1.73_m2} — ABNORMAL LOW (ref >59)

## 2022-08-03 LAB — T4, FREE: Free T4: 1.16 ng/dL (ref 0.82–1.77)

## 2022-08-03 LAB — CARDIOVASCULAR RISK ASSESSMENT

## 2022-08-03 LAB — VITAMIN D 25 HYDROXY (VIT D DEFICIENCY, FRACTURES): Vit D, 25-Hydroxy: 37.9 ng/mL (ref 30.0–100.0)

## 2022-08-03 LAB — TSH: TSH: 2.4 u[IU]/mL (ref 0.450–4.500)

## 2022-08-03 MED ORDER — ROSUVASTATIN CALCIUM 10 MG PO TABS: 10.0000 mg | ORAL_TABLET | Freq: Every day | ORAL | 2 refills | Status: DC

## 2022-08-03 MED ORDER — ICOSAPENT ETHYL 1 G PO CAPS: 2.0000 g | ORAL_CAPSULE | Freq: Two times a day (BID) | ORAL | 2 refills | Status: DC

## 2022-08-04 ENCOUNTER — Encounter: Payer: Self-pay | Admitting: Nurse Practitioner

## 2022-08-07 ENCOUNTER — Ambulatory Visit: Payer: BC Managed Care – PPO | Admitting: Family Medicine

## 2022-08-07 ENCOUNTER — Telehealth: Payer: Self-pay

## 2022-08-07 NOTE — Telephone Encounter (Signed)
PA submitted and approved for Vascepa via covermymeds. 

## 2022-08-08 ENCOUNTER — Encounter: Payer: Self-pay | Admitting: Nurse Practitioner

## 2022-08-09 ENCOUNTER — Other Ambulatory Visit: Payer: Self-pay | Admitting: Nurse Practitioner

## 2022-08-09 ENCOUNTER — Encounter: Payer: Self-pay | Admitting: Nurse Practitioner

## 2022-09-28 ENCOUNTER — Encounter: Payer: Self-pay | Admitting: Nurse Practitioner

## 2022-09-29 ENCOUNTER — Other Ambulatory Visit: Payer: Self-pay

## 2022-09-29 DIAGNOSIS — M797 Fibromyalgia: Secondary | ICD-10-CM

## 2022-09-29 MED ORDER — TRAMADOL HCL 50 MG PO TABS: 50.0000 mg | ORAL_TABLET | Freq: Three times a day (TID) | ORAL | 3 refills | Status: DC | PRN

## 2022-10-03 ENCOUNTER — Ambulatory Visit (INDEPENDENT_AMBULATORY_CARE_PROVIDER_SITE_OTHER): Payer: BC Managed Care – PPO

## 2022-10-03 DIAGNOSIS — E782 Mixed hyperlipidemia: Secondary | ICD-10-CM

## 2022-10-03 DIAGNOSIS — N289 Disorder of kidney and ureter, unspecified: Secondary | ICD-10-CM

## 2022-10-03 DIAGNOSIS — Z23 Encounter for immunization: Secondary | ICD-10-CM

## 2022-10-04 ENCOUNTER — Other Ambulatory Visit: Payer: Self-pay | Admitting: Nurse Practitioner

## 2022-10-04 DIAGNOSIS — E782 Mixed hyperlipidemia: Secondary | ICD-10-CM

## 2022-10-04 LAB — COMPREHENSIVE METABOLIC PANEL
ALT: 11 IU/L (ref 0–32)
AST: 19 IU/L (ref 0–40)
Albumin/Globulin Ratio: 1.6 (ref 1.2–2.2)
Albumin: 4.9 g/dL (ref 3.8–4.9)
Alkaline Phosphatase: 76 IU/L (ref 44–121)
BUN/Creatinine Ratio: 12 (ref 9–23)
BUN: 14 mg/dL (ref 6–24)
Bilirubin Total: 1.4 mg/dL — ABNORMAL HIGH (ref 0.0–1.2)
CO2: 23 mmol/L (ref 20–29)
Calcium: 9.8 mg/dL (ref 8.7–10.2)
Chloride: 98 mmol/L (ref 96–106)
Creatinine, Ser: 1.14 mg/dL — ABNORMAL HIGH (ref 0.57–1.00)
Globulin, Total: 3 g/dL (ref 1.5–4.5)
Glucose: 89 mg/dL (ref 70–99)
Potassium: 4.6 mmol/L (ref 3.5–5.2)
Sodium: 138 mmol/L (ref 134–144)
Total Protein: 7.9 g/dL (ref 6.0–8.5)
eGFR: 55 mL/min/{1.73_m2} — ABNORMAL LOW (ref >59)

## 2022-10-04 LAB — LIPID PANEL
Chol/HDL Ratio: 4.3 ratio (ref 0.0–4.4)
Cholesterol, Total: 187 mg/dL (ref 100–199)
HDL: 43 mg/dL (ref >39)
LDL Chol Calc (NIH): 103 mg/dL — ABNORMAL HIGH (ref 0–99)
Triglycerides: 238 mg/dL — ABNORMAL HIGH (ref 0–149)
VLDL Cholesterol Cal: 41 mg/dL — ABNORMAL HIGH (ref 5–40)

## 2022-10-04 LAB — CARDIOVASCULAR RISK ASSESSMENT

## 2022-10-04 MED ORDER — ROSUVASTATIN CALCIUM 10 MG PO TABS: 10.0000 mg | ORAL_TABLET | Freq: Every day | ORAL | 3 refills | Status: DC

## 2022-10-05 ENCOUNTER — Encounter: Payer: Self-pay | Admitting: Nurse Practitioner

## 2022-11-08 ENCOUNTER — Other Ambulatory Visit: Payer: Self-pay | Admitting: Nurse Practitioner

## 2022-11-20 DIAGNOSIS — Z743 Need for continuous supervision: Secondary | ICD-10-CM | POA: Diagnosis not present

## 2022-11-20 DIAGNOSIS — M4726 Other spondylosis with radiculopathy, lumbar region: Secondary | ICD-10-CM | POA: Diagnosis not present

## 2022-11-20 DIAGNOSIS — S335XXA Sprain of ligaments of lumbar spine, initial encounter: Secondary | ICD-10-CM | POA: Diagnosis not present

## 2022-11-20 DIAGNOSIS — M48061 Spinal stenosis, lumbar region without neurogenic claudication: Secondary | ICD-10-CM | POA: Diagnosis not present

## 2022-11-20 DIAGNOSIS — M5416 Radiculopathy, lumbar region: Secondary | ICD-10-CM | POA: Diagnosis not present

## 2022-11-20 DIAGNOSIS — M549 Dorsalgia, unspecified: Secondary | ICD-10-CM | POA: Diagnosis not present

## 2022-11-20 DIAGNOSIS — M545 Low back pain, unspecified: Secondary | ICD-10-CM | POA: Diagnosis not present

## 2022-11-21 ENCOUNTER — Telehealth: Payer: Self-pay | Admitting: *Deleted

## 2022-11-21 NOTE — Telephone Encounter (Signed)
Transition Care Management Unsuccessful Follow-up Telephone Call  Date of discharge and from where:  11/20/2022 [facility] ER  Attempts:  1st Attempt  Reason for unsuccessful TCM follow-up call:  Left voice message

## 2022-12-05 ENCOUNTER — Encounter: Payer: Self-pay | Admitting: Nurse Practitioner

## 2022-12-06 ENCOUNTER — Other Ambulatory Visit: Payer: Self-pay | Admitting: Nurse Practitioner

## 2022-12-06 DIAGNOSIS — K649 Unspecified hemorrhoids: Secondary | ICD-10-CM

## 2022-12-06 MED ORDER — HYDROCORTISONE ACETATE 25 MG RE SUPP: 25.0000 mg | Freq: Two times a day (BID) | RECTAL | 1 refills | Status: DC

## 2022-12-26 ENCOUNTER — Encounter: Payer: Medicaid Other | Admitting: Family

## 2022-12-26 ENCOUNTER — Other Ambulatory Visit: Payer: Self-pay | Admitting: Nurse Practitioner

## 2022-12-26 DIAGNOSIS — F411 Generalized anxiety disorder: Secondary | ICD-10-CM

## 2022-12-26 DIAGNOSIS — R000 Tachycardia, unspecified: Secondary | ICD-10-CM | POA: Insufficient documentation

## 2023-02-05 ENCOUNTER — Ambulatory Visit: Payer: BC Managed Care – PPO | Admitting: Nurse Practitioner

## 2023-02-05 ENCOUNTER — Ambulatory Visit: Payer: BC Managed Care – PPO | Admitting: Physician Assistant

## 2023-02-06 ENCOUNTER — Telehealth: Payer: Self-pay | Admitting: Family

## 2023-02-06 ENCOUNTER — Ambulatory Visit (INDEPENDENT_AMBULATORY_CARE_PROVIDER_SITE_OTHER): Payer: 59 | Admitting: Family

## 2023-02-06 ENCOUNTER — Encounter: Payer: Self-pay | Admitting: Family

## 2023-02-06 VITALS — BP 124/72 | HR 83 | Temp 97.9°F | Ht 64.0 in | Wt 182.0 lb

## 2023-02-06 DIAGNOSIS — K3184 Gastroparesis: Secondary | ICD-10-CM

## 2023-02-06 DIAGNOSIS — Z7689 Persons encountering health services in other specified circumstances: Secondary | ICD-10-CM | POA: Insufficient documentation

## 2023-02-06 DIAGNOSIS — K644 Residual hemorrhoidal skin tags: Secondary | ICD-10-CM

## 2023-02-06 DIAGNOSIS — R1115 Cyclical vomiting syndrome unrelated to migraine: Secondary | ICD-10-CM

## 2023-02-06 DIAGNOSIS — M797 Fibromyalgia: Secondary | ICD-10-CM | POA: Diagnosis not present

## 2023-02-06 DIAGNOSIS — K219 Gastro-esophageal reflux disease without esophagitis: Secondary | ICD-10-CM | POA: Diagnosis not present

## 2023-02-06 MED ORDER — PROMETHAZINE HCL 12.5 MG PO TABS: ORAL_TABLET | ORAL | 0 refills | Status: AC

## 2023-02-06 NOTE — Telephone Encounter (Signed)
Margaret Wood,   During patient office visit patient expressed that she did not want to stay for the appointment to [facility] anymore after I stated that I would be unable to continue ongoing chronic pain management with her tramadol, and that pt would need to see a pain management doctor. After stating this she walked out of the office before visit could be completed.

## 2023-02-06 NOTE — Assessment & Plan Note (Signed)
Continue pantoprazole 40 mg once daily,  Try to decrease and or avoid spicy foods, fried fatty foods, and also caffeine and chocolate as these can increase heartburn symptoms.

## 2023-02-06 NOTE — Assessment & Plan Note (Addendum)
During office visit, after speaking with her about need to see pain management doctor, and suggesting possible reasons for intractable nausea/cyclical vomiting (and recommending she continue at least annually with a GI doctor for chronic GI dx) pt became very upset and stated she wanted to leave. She then stood up abruptly and left the office prior to office visit being completed.  Time in clinic lasted about 32 minutes between gathering history, vital signs, and discussing chronic concerns.

## 2023-02-06 NOTE — Assessment & Plan Note (Signed)
Stable.  Has cream for prn

## 2023-02-06 NOTE — Progress Notes (Signed)
Established Patient Office Visit  Subjective:  Patient ID: Margaret Wood, female    DOB: [birth year]  Age: 62 y.o. MRN: [id number]  CC:  Chief Complaint  Patient presents with   Establish Care    HPI Margaret Wood is here for a transition of care visit.  Prior provider was: Rochel Brome in [place], prior to that was Dr. Waunita Schooner   Pt is without acute concerns.   chronic concerns:  Intractable nausea: pt takes phentermine intermittently, states she has seen GI in the past for an EGD.   Anxiety depression with slight panic attacks, finds she takes alprazolam a few times a month.   Sinus tachycardia: metoprolol 25 mg twice daily , sometimes tid when her hormones fluctuate.   Fibromyalgia and arthritis: takes about 2-3 times daily tramadol. Can not tolerate cymbalta because it causes her GI upset.   Past Medical History:  Diagnosis Date   Anxiety    Arthritis    Depression    Fibromyalgia    Gallstones    H/O [facility] spotted fever    IBS (irritable bowel syndrome)    Mitral valve prolapse    Palpitations    Scoliosis    Serotonin syndrome     Past Surgical History:  Procedure Laterality Date   [facility]    Family History  Problem Relation Age of Onset   Depression Mother    Anxiety disorder Mother    Osteoarthritis Mother    Brain cancer Father        GBM   Alcohol abuse Sister    Anxiety disorder Sister    Diabetes Brother    Diverticulitis Brother    Heart disease Brother    Alcohol abuse Brother    Healthy Brother    Arrhythmia Paternal Uncle    Brain cancer Paternal Grandfather        GBM   Lung cancer Maternal Grandmother    Lung cancer Maternal Grandfather    Brain cancer Paternal Aunt    Colon cancer Neg Hx    Breast cancer Neg Hx    Ovarian cancer Neg Hx     Social History   Socioeconomic History   Marital status: Married    Spouse name: Annie Main   Number of  children: 2   Years of education: 12   Highest education level: High school graduate  Occupational History   Occupation: UNEMPLOYED    Fish farm manager: UNEMPLOYED    Comment: trying to get disability  Tobacco Use   Smoking status: Never   Smokeless tobacco: Never  Vaping Use   Vaping Use: Never used  Substance and Sexual Activity   Alcohol use: Not Currently   Drug use: No   Sexual activity: Yes    Partners: Male    Birth control/protection: None, Patch  Other Topics Concern   Not on file  Social History Narrative   02/10/20   From: From [place], [place]   Living: with husband stephen and one son   Work: no, disability       Family: 2 sons - Denyse Amass and Lysbeth Galas (twin boys 1995)      Enjoys: reading, play banjo, crochet      Exercise: just got an exercise bike, and planning to walk when the weather is nicer   Diet: drinks diet soda, eats yogurt, trying to limit red meat - eating beyond beef  Safety   Seat belts: Yes    Guns: No   Safe in relationships: Yes    Social Determinants of Health   Financial Resource Strain: Low Risk  (04/15/2018)   Overall Financial Resource Strain (CARDIA)    Difficulty of Paying Living Expenses: Not hard at all  Food Insecurity: No Food Insecurity (04/15/2018)   Hunger Vital Sign    Worried About Running Out of Food in the Last Year: Never true    Ran Out of Food in the Last Year: Never true  Transportation Needs: No Transportation Needs (04/15/2018)   PRAPARE - Hydrologist (Medical): No    Lack of Transportation (Non-Medical): No  Physical Activity: Inactive (04/15/2018)   Exercise Vital Sign    Days of Exercise per Week: 0 days    Minutes of Exercise per Session: 0 min  Stress: Stress Concern Present (04/15/2018)   [place]    Feeling of Stress : Very much  Social Connections: Unknown (04/15/2018)   Social Connection and Isolation Panel [NHANES]     Frequency of Communication with Friends and Family: Not on file    Frequency of Social Gatherings with Friends and Family: Not on file    Attends Religious Services: Never    Active Member of Clubs or Organizations: No    Attends Archivist Meetings: Never    Marital Status: Married  Human resources officer Violence: Not At Risk (04/15/2018)   Humiliation, Afraid, Rape, and Kick questionnaire    Fear of Current or Ex-Partner: No    Emotionally Abused: No    Physically Abused: No    Sexually Abused: No    Outpatient Medications Prior to Visit  Medication Sig Dispense Refill   ALPRAZolam (XANAX) 0.25 MG tablet TAKE 1 TABLET BY MOUTH DAILY AS NEEDED FOR ANXIETY 30 tablet 1   cyclobenzaprine (FLEXERIL) 10 MG tablet Take 1 tablet (10 mg total) by mouth at bedtime as needed. 30 tablet 0   metoprolol tartrate (LOPRESSOR) 25 MG tablet Take 1 tablet (25 mg total) by mouth 2 (two) times daily. 180 tablet 1   pantoprazole (PROTONIX) 40 MG tablet TAKE 1 TABLET BY MOUTH EVERY DAY AS NEEDED FOR GERD 90 tablet 1   traMADol (ULTRAM) 50 MG tablet Take 1 tablet (50 mg total) by mouth 3 (three) times daily as needed for moderate pain or severe pain. TAKE 1 TABLET BY MOUTH EVERY 8 HOURS 80 tablet 3   dicyclomine (BENTYL) 10 MG capsule TAKE 1 CAPSULE BY MOUTH EVERY 6 HOURS AS NEEDED 90 capsule 0   hydrocortisone (ANUSOL-HC) 25 MG suppository Place 1 suppository (25 mg total) rectally as needed. UNWRAP AND INSERT 1 SUPPOSITORY (25 MG TOTAL) RECTALLY 2 (TWO) TIMES DAILY. 10 suppository 0   hydrocortisone (ANUSOL-HC) 25 MG suppository Place 1 suppository (25 mg total) rectally 2 (two) times daily. 12 suppository 1   promethazine (PHENERGAN) 12.5 MG tablet TAKE 1 TABLET(12.5 MG) BY MOUTH EVERY 6 HOURS AS NEEDED FOR NAUSEA OR VOMITING 30 tablet 0   rosuvastatin (CRESTOR) 10 MG tablet Take 1 tablet (10 mg total) by mouth daily. 90 tablet 3   VASCEPA 1 g capsule TAKE 2 CAPSULES BY MOUTH 2 TIMES DAILY. 120 capsule 2    promethazine (PHENERGAN) 25 MG suppository Place 1 suppository (25 mg total) rectally every 6 (six) hours as needed for nausea. 12 suppository 1   No facility-administered medications prior to visit.  Allergies  Allergen Reactions   Amoxicillin Nausea Only   Cymbalta [Duloxetine Hcl]     SOB, palpitations, nausea   Other     Cannot take anything extended release per pt   Tegretol [Carbamazepine]     Made her ill    ROS: Pertinent symptoms negative unless otherwise noted in HPI     Objective:    Physical Exam Vitals reviewed.    Pt left prior to PE being able to completed.    BP 124/72   Pulse 83   Temp 97.9 F (36.6 C) (Temporal)   Ht '5\' 4"'$  (1.626 m)   Wt 182 lb (82.6 kg)   SpO2 98%   BMI 31.24 kg/m  Wt Readings from Last 3 Encounters:  02/06/23 182 lb (82.6 kg)  08/02/22 194 lb (88 kg)  01/30/22 197 lb 4 oz (89.5 kg)     Health Maintenance Due  Topic Date Due   COVID-19 Vaccine (5 - 2023-24 season) 07/28/2022    There are no preventive care reminders to display for this patient.  Lab Results  Component Value Date   TSH 2.400 08/02/2022   Lab Results  Component Value Date   WBC 4.9 08/02/2022   HGB 13.5 08/02/2022   HCT 39.7 08/02/2022   MCV 89 08/02/2022   PLT 258 08/02/2022   Lab Results  Component Value Date   NA 138 10/03/2022   K 4.6 10/03/2022   CO2 23 10/03/2022   GLUCOSE 89 10/03/2022   BUN 14 10/03/2022   CREATININE 1.14 (H) 10/03/2022   BILITOT 1.4 (H) 10/03/2022   ALKPHOS 76 10/03/2022   AST 19 10/03/2022   ALT 11 10/03/2022   PROT 7.9 10/03/2022   ALBUMIN 4.9 10/03/2022   CALCIUM 9.8 10/03/2022   ANIONGAP 13 09/30/2018   EGFR 55 (L) 10/03/2022   GFR 54.75 (L) 01/30/2022   Lab Results  Component Value Date   CHOL 187 10/03/2022   Lab Results  Component Value Date   HDL 43 10/03/2022   Lab Results  Component Value Date   LDLCALC 103 (H) 10/03/2022   Lab Results  Component Value Date   TRIG 238 (H)  10/03/2022   Lab Results  Component Value Date   CHOLHDL 4.3 10/03/2022   Lab Results  Component Value Date   HGBA1C 5.1 02/23/2014      Assessment & Plan:   Gastroparesis  Cyclical vomiting -     Promethazine HCl; TAKE 1 TABLET(12.5 MG) BY MOUTH EVERY 6 HOURS AS NEEDED FOR NAUSEA OR VOMITING  Dispense: 30 tablet; Refill: 0  Gastroesophageal reflux disease, unspecified whether esophagitis present Assessment & Plan: Continue pantoprazole 40 mg once daily,  Try to decrease and or avoid spicy foods, fried fatty foods, and also caffeine and chocolate as these can increase heartburn symptoms.     External hemorrhoid, bleeding Assessment & Plan: Stable.  Has cream for prn    Fibromyalgia Assessment & Plan: Advised pt I will not continue tramadol for chronic pain and will need to see pain management for this for future refills. Did suggest I would send in thirty days for tramadol refill however pt left before exam could be completed and documents sign for non-opiod agreement and or UDS.     Establishing care with new doctor, encounter for Assessment & Plan: During office visit, after speaking with her about need to see pain management doctor, and suggesting possible reasons for intractable nausea/cyclical vomiting (and recommending she continue at least annually with  a GI doctor for chronic GI dx) pt became very upset and stated she wanted to leave. She then stood up abruptly and left the office prior to office visit being completed.  Time in clinic lasted about 32 minutes between gathering history, vital signs, and discussing chronic concerns.      Meds ordered this encounter  Medications   promethazine (PHENERGAN) 12.5 MG tablet    Sig: TAKE 1 TABLET(12.5 MG) BY MOUTH EVERY 6 HOURS AS NEEDED FOR NAUSEA OR VOMITING    Dispense:  30 tablet    Refill:  0    Order Specific Question:   Supervising Provider    Answer:   Diona Browner, AMY E [[id number]]    Follow-up: Return for pt  will need to establish care with another primary care provider.    Eugenia Pancoast, FNP

## 2023-02-06 NOTE — Assessment & Plan Note (Addendum)
Advised pt I will not continue tramadol for chronic pain and will need to see pain management for this for future refills. Did suggest I would send in thirty days for tramadol refill however pt left before exam could be completed and documents sign for non-opiod agreement and or UDS.

## 2023-02-12 ENCOUNTER — Encounter: Payer: Self-pay | Admitting: Nurse Practitioner

## 2023-02-12 ENCOUNTER — Ambulatory Visit: Payer: BC Managed Care – PPO | Admitting: Nurse Practitioner

## 2023-02-12 ENCOUNTER — Ambulatory Visit (INDEPENDENT_AMBULATORY_CARE_PROVIDER_SITE_OTHER): Payer: 59 | Admitting: Nurse Practitioner

## 2023-02-12 VITALS — BP 120/70 | HR 80 | Temp 97.3°F | Resp 16 | Ht 64.0 in | Wt 180.2 lb

## 2023-02-12 DIAGNOSIS — M797 Fibromyalgia: Secondary | ICD-10-CM | POA: Diagnosis not present

## 2023-02-12 DIAGNOSIS — K219 Gastro-esophageal reflux disease without esophagitis: Secondary | ICD-10-CM | POA: Diagnosis not present

## 2023-02-12 DIAGNOSIS — R000 Tachycardia, unspecified: Secondary | ICD-10-CM

## 2023-02-12 DIAGNOSIS — E782 Mixed hyperlipidemia: Secondary | ICD-10-CM | POA: Diagnosis not present

## 2023-02-12 MED ORDER — METOPROLOL TARTRATE 25 MG PO TABS: 25.0000 mg | ORAL_TABLET | Freq: Two times a day (BID) | ORAL | 3 refills | Status: DC

## 2023-02-12 MED ORDER — PANTOPRAZOLE SODIUM 40 MG PO TBEC: DELAYED_RELEASE_TABLET | ORAL | 1 refills | Status: DC

## 2023-02-12 MED ORDER — TRAMADOL HCL 50 MG PO TABS: 50.0000 mg | ORAL_TABLET | Freq: Three times a day (TID) | ORAL | 0 refills | Status: DC | PRN

## 2023-02-12 NOTE — Assessment & Plan Note (Signed)
Statin intolerant Diet and exercise continue  Will add alternatives after lipid profile today

## 2023-02-12 NOTE — Assessment & Plan Note (Addendum)
Patient is aware to referring to pain management [facility] to continue control her pain management for her fibromyalgia Tramadol 30 tabs given this time Will do opioids agreement in next follow up, patient claimed not misusing medicine Continue Flexeril as needed

## 2023-02-12 NOTE — Patient Instructions (Addendum)
Follow up in 6 months Will follow up with lab values Will refer to the integrated [facility] clinic for the pain related to fibromyalgia   Myofascial Pain Syndrome and Fibromyalgia Myofascial pain syndrome and fibromyalgia are both pain disorders. You may feel this pain mainly in your muscles. Myofascial pain syndrome: Always has tender points in the muscles that will cause pain when pressed (trigger points). The pain may come and go. Usually affects your neck, upper back, and shoulder areas. The pain often moves into your arms and hands. Fibromyalgia: Has muscle pains and tenderness that come and go. Is often associated with tiredness (fatigue) and sleep problems. Has trigger points. Tends to be long-lasting (chronic), but is not life-threatening. Fibromyalgia and myofascial pain syndrome are not the same. However, they often occur together. If you have both conditions, each can make the other worse. Both are common and can cause enough pain and fatigue to make day-to-day activities difficult. Both can be hard to diagnose because their symptoms are common in many other conditions. What are the causes? The exact causes of these conditions are not known. What increases the risk? You are more likely to develop either of these conditions if: You have a family history of the condition. You are female. You have certain triggers, such as: Spine disorders. An injury (trauma) or other physical stressors. Being under a lot of stress. Medical conditions such as osteoarthritis, rheumatoid arthritis, or lupus. What are the signs or symptoms? Fibromyalgia The main symptom of fibromyalgia is widespread pain and tenderness in your muscles. Pain is sometimes described as stabbing, shooting, or burning. You may also have: Tingling or numbness. Sleep problems and fatigue. Problems with attention and concentration (fibro fog). Other symptoms may include: Bowel and bladder  problems. Headaches. Vision problems. Sensitivity to odors and noises. Depression or mood changes. Painful menstrual periods (dysmenorrhea). Dry skin or eyes. These symptoms can vary over time. Myofascial pain syndrome Symptoms of myofascial pain syndrome include: Tight, ropy bands of muscle. Uncomfortable sensations in muscle areas. These may include aching, cramping, burning, numbness, tingling, and weakness. Difficulty moving certain parts of the body freely (poor range of motion). How is this diagnosed? This condition may be diagnosed by your symptoms and medical history. You will also have a physical exam. In general: Fibromyalgia is diagnosed if you have pain, fatigue, and other symptoms for more than 3 months, and symptoms cannot be explained by another condition. Myofascial pain syndrome is diagnosed if you have trigger points in your muscles, and those trigger points are tender and cause pain elsewhere in your body (referred pain). How is this treated? Treatment for these conditions depends on the type that you have. For fibromyalgia, a healthy lifestyle is the most important treatment including aerobic and strength exercises. Different types of medicines are used to help treat pain and include: NSAIDs. Medicines for treating depression. Medicines that help control seizures. Medicines that relax the muscles. Treatment for myofascial pain syndrome includes: Pain medicines, such as NSAIDs. Cooling and stretching of muscles. Massage therapy with myofascial release technique. Trigger point injections. Treating these conditions often requires a team of health care providers. These may include: Your primary care provider. A physical therapist. Complementary health care providers, such as massage therapists or acupuncturists. A psychiatrist for cognitive behavioral therapy. Follow these instructions at home: Medicines Take over-the-counter and prescription medicines only as told  by your health care provider. Ask your health care provider if the medicine prescribed to you: Requires you to avoid  driving or using machinery. Can cause constipation. You may need to take these actions to prevent or treat constipation: Drink enough fluid to keep your urine pale yellow. Take over-the-counter or prescription medicines. Eat foods that are high in fiber, such as beans, whole grains, and fresh fruits and vegetables. Limit foods that are high in fat and processed sugars, such as fried or sweet foods. Lifestyle  Do exercises as told by your health care provider or physical therapist. Practice relaxation techniques to control your stress. You may want to try: Biofeedback. Visual imagery. Hypnosis. Muscle relaxation. Yoga. Meditation. Maintain a healthy lifestyle. This includes eating a healthy diet and getting enough sleep. Do not use any products that contain nicotine or tobacco. These products include cigarettes, chewing tobacco, and vaping devices, such as e-cigarettes. If you need help quitting, ask your health care provider. General instructions Talk to your health care provider about complementary treatments, such as acupuncture or massage. Do not do activities that stress or strain your muscles. This includes repetitive motions and heavy lifting. Keep all follow-up visits. This is important. Where to find support Consider joining a support group with others who are diagnosed with this condition. National Fibromyalgia Association: fmaware.org Where to find more information U.S. Pain Foundation: uspainfoundation.org Contact a health care provider if: You have new symptoms. Your symptoms get worse or your pain is severe. You have side effects from your medicines. You have trouble sleeping. Your condition is causing depression or anxiety. Get help right away if: You have thoughts of hurting yourself or others. Get help right away if you feel like you may hurt  yourself or others, or have thoughts about taking your own life. Go to your nearest emergency room or: Call 911. Call the [place] at [phone] or 988. This is open 24 hours a day. Text the Crisis Text Line at (725) 417-6042. This information is not intended to replace advice given to you by your health care provider. Make sure you discuss any questions you have with your health care provider. Document Revised: 08/21/2022 Document Reviewed: 10/14/2021 Elsevier Patient Education  [place].

## 2023-02-12 NOTE — Progress Notes (Signed)
Subjective:  Patient ID: Margaret Wood, female    DOB: 1961-01-12  Age: 62 y.o. MRN: [id number]  CHIEF COMPLAINT: Patient is here for a med refill and lab works  History of present illness: Patient is here to discuss about her current problems, labs and medicines to be refilled. She is in the clinic accompanied with her husband.    For her Fibromyalgia:  She states she is diagnosed with Fibromyalgia for the past 15-16 yrs and been taking tramadol 50 mg three times daily and tylenol 1000 mg twice daily.  She states she is in pain all the time . She has tried different medicines in the past like Cymbalta, Amitriptyline, Aleve and said nothing worked for her and gave her GI upset except the tramadol and tylenol.  She is worried not filling her medicines and she will run out of it. She said she needs to be in tramadol. She never tried other therapies like chiropractic, and acupuncture . She has not consulted to any pain specialist for fibromyalgia so far but interested to be referred somewhere to continue management of her pain related to fibromyalgia.  She said she is dealing with constant pain for a long time. She also takes Xanax 0.25 mg daily AS NEEDED and flexeril 10 mg at bed time AS NEEDED.   For tachycardia: Pt was admitted to July 2020 at  [facility] [place] [place] [facility] for sinus tach and she was prescribed metoprolol 25 mg BD. She is taking metoprolol 25 mg BD but she is not following to any cardiologist at this moment. Patient states her heart rate goes up to 160 and come back to 60-70's quickly. Patient said she has a POTS. She wants to be referred to cardiology in future if she is symptomatic but now she thinks it is under controlled.  GERD:  GERD is controlled with pantoprazole 40 mg daily   Patient said she is Statin intolerant - She used rosuvastatin and vascepa and it gave her bad side effects. If her lipid profile still bad she wants to try something new.       02/06/2023   11:17 AM 08/02/2022    2:16 PM 01/30/2022    3:59 PM 02/10/2020    2:44 PM 11/05/2019    5:12 PM  Depression screen PHQ 2/9  Decreased Interest 0 0 1 1 1   Down, Depressed, Hopeless 1 0 1 1 1   PHQ - 2 Score 1 0 2 2 2   Altered sleeping  3 1 1 1   Tired, decreased energy  3 2 2 2   Change in appetite  1 1 0 0  Feeling bad or failure about yourself   0 1 1 0  Trouble concentrating  3 1 1 1   Moving slowly or fidgety/restless  0 0 0 1  Suicidal thoughts  0 0 0 0  PHQ-9 Score  10 8 7 7   Difficult doing work/chores Somewhat difficult Somewhat difficult Somewhat difficult Somewhat difficult Somewhat difficult         03/12/2018   10:00 AM 09/30/2018   12:10 PM 02/06/2023   11:17 AM  Fall Risk  Falls in the past year? Yes  0  Was there an injury with Fall? Yes  0  Fall Risk Category Calculator   0  (RETIRED) Patient Fall Risk Level  Low fall risk   Patient at Risk for Falls Due to History of fall(s);Medication side effect    Fall risk Follow up Falls evaluation completed  Falls evaluation completed;Education provided;Falls prevention discussed      Review of Systems  Constitutional:  Negative for chills, fatigue and fever.  HENT:  Negative for congestion, rhinorrhea, sneezing and sore throat.   Respiratory:  Negative for cough and shortness of breath.   Cardiovascular:  Negative for chest pain and palpitations (controlled with metoprolol).  Gastrointestinal:  Negative for abdominal pain, constipation, diarrhea, nausea and vomiting.  Genitourinary:  Negative for dysuria and urgency.  Musculoskeletal:  Positive for arthralgias, back pain and neck pain. Negative for myalgias.  Neurological:  Positive for headaches. Negative for dizziness, weakness and light-headedness.  Psychiatric/Behavioral:  Negative for dysphoric mood. The patient is not nervous/anxious.     Current Outpatient Medications on File Prior to Visit  Medication Sig Dispense Refill   ALPRAZolam (XANAX) 0.25 MG  tablet TAKE 1 TABLET BY MOUTH DAILY AS NEEDED FOR ANXIETY 30 tablet 1   cyclobenzaprine (FLEXERIL) 10 MG tablet Take 1 tablet (10 mg total) by mouth at bedtime as needed. 30 tablet 0   hydrocortisone (ANUSOL-HC) 25 MG suppository Place 25 mg rectally 2 (two) times daily as needed for hemorrhoids or anal itching.     promethazine (PHENERGAN) 12.5 MG tablet TAKE 1 TABLET(12.5 MG) BY MOUTH EVERY 6 HOURS AS NEEDED FOR NAUSEA OR VOMITING 30 tablet 0   No current facility-administered medications on file prior to visit.   Past Medical History:  Diagnosis Date   Anxiety    Arthritis    Depression    Fibromyalgia    Gallstones    H/O [facility] spotted fever    IBS (irritable bowel syndrome)    Mitral valve prolapse    Palpitations    Scoliosis    Serotonin syndrome    Past Surgical History:  Procedure Laterality Date   [place]    Family History  Problem Relation Age of Onset   Depression Mother    Anxiety disorder Mother    Osteoarthritis Mother    Brain cancer Father        GBM   Alcohol abuse Sister    Anxiety disorder Sister    Diabetes Brother    Diverticulitis Brother    Heart disease Brother    Alcohol abuse Brother    Healthy Brother    Arrhythmia Paternal Uncle    Brain cancer Paternal Grandfather        GBM   Lung cancer Maternal Grandmother    Lung cancer Maternal Grandfather    Brain cancer Paternal Aunt    Colon cancer Neg Hx    Breast cancer Neg Hx    Ovarian cancer Neg Hx    Social History   Socioeconomic History   Marital status: Married    Spouse name: Annie Main   Number of children: 2   Years of education: 12   Highest education level: High school graduate  Occupational History   Occupation: UNEMPLOYED    Fish farm manager: UNEMPLOYED    Comment: trying to get disability  Tobacco Use   Smoking status: Never   Smokeless tobacco: Never  Vaping Use   Vaping Use: Never used  Substance and  Sexual Activity   Alcohol use: Not Currently   Drug use: No   Sexual activity: Yes    Partners: Male    Birth control/protection: None, Patch  Other Topics Concern   Not on file  Social History Narrative   02/10/20   From: From [place],  [place]   Living: with husband stephen and one son   Work: no, disability       Family: 2 sons - Denyse Amass and Lysbeth Galas (twin boys 1995)      Enjoys: reading, play banjo, crochet      Exercise: just got an exercise bike, and planning to walk when the weather is nicer   Diet: drinks diet soda, eats yogurt, trying to limit red meat - eating beyond beef      Safety   Seat belts: Yes    Guns: No   Safe in relationships: Yes    Social Determinants of Health   Financial Resource Strain: Low Risk  (04/15/2018)   Overall Financial Resource Strain (CARDIA)    Difficulty of Paying Living Expenses: Not hard at all  Food Insecurity: No Food Insecurity (04/15/2018)   Hunger Vital Sign    Worried About Running Out of Food in the Last Year: Never true    [place] in the Last Year: Never true  Transportation Needs: No Transportation Needs (04/15/2018)   PRAPARE - Hydrologist (Medical): No    Lack of Transportation (Non-Medical): No  Physical Activity: Inactive (04/15/2018)   Exercise Vital Sign    Days of Exercise per Week: 0 days    Minutes of Exercise per Session: 0 min  Stress: Stress Concern Present (04/15/2018)   [place]    Feeling of Stress : Very much  Social Connections: Unknown (04/15/2018)   Social Connection and Isolation Panel [NHANES]    Frequency of Communication with Friends and Family: Not on file    Frequency of Social Gatherings with Friends and Family: Not on file    Attends Religious Services: Never    Marine scientist or Organizations: No    Attends Music therapist: Never    Marital Status: Married    Objective:  BP  120/70   Pulse 80   Temp (!) 97.3 F (36.3 C)   Resp 16   Ht 5\' 4"  (1.626 m)   Wt 180 lb 3.2 oz (81.7 kg)   BMI 30.93 kg/m      02/12/2023    1:29 PM 02/06/2023   11:18 AM 08/02/2022    2:13 PM  BP/Weight  Systolic BP [id number]  Diastolic BP 70 72 68  Wt. (Lbs) 180.2 182 194  BMI 30.93 kg/m2 31.24 kg/m2 34.37 kg/m2    Physical Exam Vitals reviewed.  Constitutional:      Appearance: Normal appearance. She is obese.  Neck:     Vascular: No carotid bruit.  Cardiovascular:     Rate and Rhythm: Normal rate and regular rhythm.     Heart sounds: Normal heart sounds.  Pulmonary:     Effort: Pulmonary effort is normal.     Breath sounds: Normal breath sounds.  Abdominal:     General: Bowel sounds are normal.     Palpations: Abdomen is soft.     Tenderness: There is no abdominal tenderness.  Musculoskeletal:        General: Tenderness (pt is tender to touch all her extremities r/t fibromyalgia) present. No swelling, deformity or signs of injury.     Right lower leg: No edema.     Left lower leg: No edema.  Neurological:     Mental Status: She is alert and oriented to person, place, and time.  Psychiatric:  Mood and Affect: Mood normal.        Behavior: Behavior normal.    Lab Results  Component Value Date   WBC 4.9 08/02/2022   HGB 13.5 08/02/2022   HCT 39.7 08/02/2022   PLT 258 08/02/2022   GLUCOSE 89 10/03/2022   CHOL 187 10/03/2022   TRIG 238 (H) 10/03/2022   HDL 43 10/03/2022   LDLDIRECT 73.0 01/30/2022   LDLCALC 103 (H) 10/03/2022   ALT 11 10/03/2022   AST 19 10/03/2022   NA 138 10/03/2022   K 4.6 10/03/2022   CL 98 10/03/2022   CREATININE 1.14 (H) 10/03/2022   BUN 14 10/03/2022   CO2 23 10/03/2022   TSH 2.400 08/02/2022   HGBA1C 5.1 02/23/2014      Assessment & Plan:    Fibromyalgia Assessment & Plan: Patient is aware to referring to [facility] to continue control her pain management for her  fibromyalgia Tramadol 30 tabs given this time Will do opioids agreement in next follow up, patient claimed not misusing medicine Continue Flexeril as needed  Orders: -     CBC with Differential/Platelet -     Comprehensive metabolic panel -     TSH -     traMADol HCl; Take 1 tablet (50 mg total) by mouth 3 (three) times daily as needed for moderate pain or severe pain. TAKE 1 TABLET BY MOUTH EVERY 8 HOURS  Dispense: 30 tablet; Refill: 0 -     Ambulatory referral to Pain Clinic  Gastroesophageal reflux disease, unspecified whether esophagitis present Assessment & Plan: Continue protonix 40 mg OD Avoids food triggers GERD  Orders: -     Pantoprazole Sodium; TAKE 1 TABLET BY MOUTH EVERY DAY AS NEEDED FOR GERD  Dispense: 90 tablet; Refill: 1  Sinus tachycardia Assessment & Plan: Controlled with Metoprolol 25 mg BD Will refer to cardiology if symptoms not controlled  Orders: -     Metoprolol Tartrate; Take 1 tablet (25 mg total) by mouth 2 (two) times daily.  Dispense: 90 tablet; Refill: 3  Mixed hyperlipidemia Assessment & Plan: Statin intolerant Diet and exercise continue  Will add alternatives after lipid profile today  Orders: -     CBC with Differential/Platelet -     Comprehensive metabolic panel -     TSH -     Lipid panel     Meds ordered this encounter  Medications   pantoprazole (PROTONIX) 40 MG tablet    Sig: TAKE 1 TABLET BY MOUTH EVERY DAY AS NEEDED FOR GERD    Dispense:  90 tablet    Refill:  1   metoprolol tartrate (LOPRESSOR) 25 MG tablet    Sig: Take 1 tablet (25 mg total) by mouth 2 (two) times daily.    Dispense:  90 tablet    Refill:  3   traMADol (ULTRAM) 50 MG tablet    Sig: Take 1 tablet (50 mg total) by mouth 3 (three) times daily as needed for moderate pain or severe pain. TAKE 1 TABLET BY MOUTH EVERY 8 HOURS    Dispense:  30 tablet    Refill:  0    Order Specific Question:   Supervising Provider    AnswerShelton Silvas     Orders Placed This Encounter  Procedures   CBC with Differential/Platelet   Comprehensive metabolic panel   TSH   Lipid Panel   Ambulatory referral to Pain Clinic    Follow up in 6  months Will follow up with lab values Will refer to the [facility] clinic for the pain related to fibromyalgia  Follow-up: Return in about 6 months (around 08/15/2023) for FASTING, CHRONIC. An After Visit Summary was printed and given to the patient.  I, Neil Crouch have reviewed all documentation for this visit. The documentation on 02/12/23   for the exam, diagnosis, procedures, and orders are all accurate and complete.    Neil Crouch, DNP, [facility] [phone]

## 2023-02-12 NOTE — Assessment & Plan Note (Signed)
Controlled with Metoprolol 25 mg BD Will refer to cardiology if symptoms not controlled

## 2023-02-12 NOTE — Assessment & Plan Note (Signed)
Continue protonix 40 mg OD Avoids food triggers GERD

## 2023-02-13 ENCOUNTER — Encounter: Payer: Self-pay | Admitting: Nurse Practitioner

## 2023-02-13 ENCOUNTER — Other Ambulatory Visit: Payer: Self-pay | Admitting: Nurse Practitioner

## 2023-02-13 ENCOUNTER — Telehealth: Payer: Self-pay

## 2023-02-13 DIAGNOSIS — E782 Mixed hyperlipidemia: Secondary | ICD-10-CM

## 2023-02-13 DIAGNOSIS — E781 Pure hyperglyceridemia: Secondary | ICD-10-CM

## 2023-02-13 LAB — CBC WITH DIFFERENTIAL/PLATELET
Basophils Absolute: 0.1 10*3/uL (ref 0.0–0.2)
Basos: 1 %
EOS (ABSOLUTE): 0.1 10*3/uL (ref 0.0–0.4)
Eos: 3 %
Hematocrit: 41.7 % (ref 34.0–46.6)
Hemoglobin: 13.9 g/dL (ref 11.1–15.9)
Immature Grans (Abs): 0 10*3/uL (ref 0.0–0.1)
Immature Granulocytes: 0 %
Lymphocytes Absolute: 1.8 10*3/uL (ref 0.7–3.1)
Lymphs: 39 %
MCH: 30.2 pg (ref 26.6–33.0)
MCHC: 33.3 g/dL (ref 31.5–35.7)
MCV: 91 fL (ref 79–97)
Monocytes Absolute: 0.4 10*3/uL (ref 0.1–0.9)
Monocytes: 8 %
Neutrophils Absolute: 2.3 10*3/uL (ref 1.4–7.0)
Neutrophils: 49 %
Platelets: 281 10*3/uL (ref 150–450)
RBC: 4.61 x10E6/uL (ref 3.77–5.28)
RDW: 13 % (ref 11.7–15.4)
WBC: 4.7 10*3/uL (ref 3.4–10.8)

## 2023-02-13 LAB — COMPREHENSIVE METABOLIC PANEL
ALT: 17 IU/L (ref 0–32)
AST: 24 IU/L (ref 0–40)
Albumin/Globulin Ratio: 1.5 (ref 1.2–2.2)
Albumin: 4.8 g/dL (ref 3.8–4.9)
Alkaline Phosphatase: 105 IU/L (ref 44–121)
BUN/Creatinine Ratio: 13 (ref 9–23)
BUN: 13 mg/dL (ref 6–24)
Bilirubin Total: 1.2 mg/dL (ref 0.0–1.2)
CO2: 26 mmol/L (ref 20–29)
Calcium: 10.4 mg/dL — ABNORMAL HIGH (ref 8.7–10.2)
Chloride: 100 mmol/L (ref 96–106)
Creatinine, Ser: 1.01 mg/dL — ABNORMAL HIGH (ref 0.57–1.00)
Globulin, Total: 3.1 g/dL (ref 1.5–4.5)
Glucose: 94 mg/dL (ref 70–99)
Potassium: 5.1 mmol/L (ref 3.5–5.2)
Sodium: 139 mmol/L (ref 134–144)
Total Protein: 7.9 g/dL (ref 6.0–8.5)
eGFR: 64 mL/min/{1.73_m2} (ref >59)

## 2023-02-13 LAB — LIPID PANEL
Chol/HDL Ratio: 5.9 ratio — ABNORMAL HIGH (ref 0.0–4.4)
Cholesterol, Total: 275 mg/dL — ABNORMAL HIGH (ref 100–199)
HDL: 47 mg/dL (ref >39)
LDL Chol Calc (NIH): 151 mg/dL — ABNORMAL HIGH (ref 0–99)
Triglycerides: 413 mg/dL — ABNORMAL HIGH (ref 0–149)
VLDL Cholesterol Cal: 77 mg/dL — ABNORMAL HIGH (ref 5–40)

## 2023-02-13 LAB — CARDIOVASCULAR RISK ASSESSMENT

## 2023-02-13 LAB — TSH: TSH: 1.83 u[IU]/mL (ref 0.450–4.500)

## 2023-02-13 MED ORDER — NEXLETOL 180 MG PO TABS: 1.0000 | ORAL_TABLET | Freq: Every day | ORAL | 2 refills | Status: DC

## 2023-02-13 MED ORDER — ICOSAPENT ETHYL 1 G PO CAPS: 2.0000 g | ORAL_CAPSULE | Freq: Two times a day (BID) | ORAL | 1 refills | Status: DC

## 2023-02-13 NOTE — Telephone Encounter (Signed)
PA submitted and approved via covermymeds for Nexletol.

## 2023-02-14 ENCOUNTER — Other Ambulatory Visit: Payer: Self-pay

## 2023-02-14 ENCOUNTER — Telehealth: Payer: Self-pay

## 2023-02-14 DIAGNOSIS — R000 Tachycardia, unspecified: Secondary | ICD-10-CM

## 2023-02-14 DIAGNOSIS — K219 Gastro-esophageal reflux disease without esophagitis: Secondary | ICD-10-CM

## 2023-02-14 DIAGNOSIS — M797 Fibromyalgia: Secondary | ICD-10-CM

## 2023-02-14 DIAGNOSIS — E781 Pure hyperglyceridemia: Secondary | ICD-10-CM

## 2023-02-14 DIAGNOSIS — E782 Mixed hyperlipidemia: Secondary | ICD-10-CM

## 2023-02-14 MED ORDER — ICOSAPENT ETHYL 1 G PO CAPS: 2.0000 g | ORAL_CAPSULE | Freq: Two times a day (BID) | ORAL | 0 refills | Status: DC

## 2023-02-14 MED ORDER — TRAMADOL HCL 50 MG PO TABS: 50.0000 mg | ORAL_TABLET | Freq: Three times a day (TID) | ORAL | 0 refills | Status: DC | PRN

## 2023-02-14 MED ORDER — METOPROLOL TARTRATE 25 MG PO TABS: 25.0000 mg | ORAL_TABLET | Freq: Two times a day (BID) | ORAL | 0 refills | Status: DC

## 2023-02-14 MED ORDER — NEXLETOL 180 MG PO TABS: 1.0000 | ORAL_TABLET | Freq: Every day | ORAL | 2 refills | Status: DC

## 2023-02-14 MED ORDER — PANTOPRAZOLE SODIUM 40 MG PO TBEC: DELAYED_RELEASE_TABLET | ORAL | 0 refills | Status: DC

## 2023-02-14 NOTE — Telephone Encounter (Signed)
Per covermymeds "Drug is covered by current benefit plan. No further PA activity needed" for tramadol 50 mg.

## 2023-02-16 NOTE — Telephone Encounter (Signed)
Great thank you!

## 2023-03-09 ENCOUNTER — Telehealth: Payer: Self-pay

## 2023-03-09 NOTE — Telephone Encounter (Signed)
PA submitted and approved via covermymeds for vascepa. 

## 2023-03-15 DIAGNOSIS — M797 Fibromyalgia: Secondary | ICD-10-CM | POA: Diagnosis not present

## 2023-03-15 DIAGNOSIS — Z79899 Other long term (current) drug therapy: Secondary | ICD-10-CM | POA: Diagnosis not present

## 2023-03-15 DIAGNOSIS — F419 Anxiety disorder, unspecified: Secondary | ICD-10-CM | POA: Diagnosis not present

## 2023-03-15 DIAGNOSIS — Z1389 Encounter for screening for other disorder: Secondary | ICD-10-CM | POA: Diagnosis not present

## 2023-03-15 DIAGNOSIS — G894 Chronic pain syndrome: Secondary | ICD-10-CM | POA: Diagnosis not present

## 2023-03-19 ENCOUNTER — Other Ambulatory Visit: Payer: Self-pay

## 2023-03-19 DIAGNOSIS — R000 Tachycardia, unspecified: Secondary | ICD-10-CM

## 2023-03-19 MED ORDER — METOPROLOL TARTRATE 25 MG PO TABS: 25.0000 mg | ORAL_TABLET | Freq: Two times a day (BID) | ORAL | 0 refills | Status: DC

## 2023-04-12 ENCOUNTER — Other Ambulatory Visit: Payer: Self-pay

## 2023-04-12 DIAGNOSIS — K219 Gastro-esophageal reflux disease without esophagitis: Secondary | ICD-10-CM

## 2023-04-12 MED ORDER — PANTOPRAZOLE SODIUM 40 MG PO TBEC: DELAYED_RELEASE_TABLET | ORAL | 0 refills | Status: DC

## 2023-04-26 DIAGNOSIS — Z79899 Other long term (current) drug therapy: Secondary | ICD-10-CM | POA: Diagnosis not present

## 2023-04-26 DIAGNOSIS — Z1389 Encounter for screening for other disorder: Secondary | ICD-10-CM | POA: Diagnosis not present

## 2023-04-26 DIAGNOSIS — M797 Fibromyalgia: Secondary | ICD-10-CM | POA: Diagnosis not present

## 2023-04-26 DIAGNOSIS — F419 Anxiety disorder, unspecified: Secondary | ICD-10-CM | POA: Diagnosis not present

## 2023-04-26 DIAGNOSIS — G894 Chronic pain syndrome: Secondary | ICD-10-CM | POA: Diagnosis not present

## 2023-05-24 DIAGNOSIS — M797 Fibromyalgia: Secondary | ICD-10-CM | POA: Diagnosis not present

## 2023-05-24 DIAGNOSIS — G894 Chronic pain syndrome: Secondary | ICD-10-CM | POA: Diagnosis not present

## 2023-05-24 DIAGNOSIS — Z1389 Encounter for screening for other disorder: Secondary | ICD-10-CM | POA: Diagnosis not present

## 2023-05-24 DIAGNOSIS — Z79899 Other long term (current) drug therapy: Secondary | ICD-10-CM | POA: Diagnosis not present

## 2023-05-24 DIAGNOSIS — F419 Anxiety disorder, unspecified: Secondary | ICD-10-CM | POA: Diagnosis not present

## 2023-05-30 ENCOUNTER — Telehealth: Payer: Self-pay

## 2023-05-30 NOTE — Telephone Encounter (Signed)
Chart review completed for patient. Patient is due for screening mammogram. Mychart message sent to patient to inquire about scheduling mammogram.  Pa Tennant, Population Health Specialist.  

## 2023-06-22 ENCOUNTER — Other Ambulatory Visit: Payer: Self-pay | Admitting: Family Medicine

## 2023-06-22 DIAGNOSIS — R000 Tachycardia, unspecified: Secondary | ICD-10-CM

## 2023-07-05 DIAGNOSIS — M797 Fibromyalgia: Secondary | ICD-10-CM | POA: Diagnosis not present

## 2023-07-05 DIAGNOSIS — G894 Chronic pain syndrome: Secondary | ICD-10-CM | POA: Diagnosis not present

## 2023-07-05 DIAGNOSIS — Z79891 Long term (current) use of opiate analgesic: Secondary | ICD-10-CM | POA: Diagnosis not present

## 2023-07-05 DIAGNOSIS — M419 Scoliosis, unspecified: Secondary | ICD-10-CM | POA: Diagnosis not present

## 2023-07-05 DIAGNOSIS — Z1389 Encounter for screening for other disorder: Secondary | ICD-10-CM | POA: Diagnosis not present

## 2023-08-07 DIAGNOSIS — M542 Cervicalgia: Secondary | ICD-10-CM | POA: Diagnosis not present

## 2023-08-07 DIAGNOSIS — M47814 Spondylosis without myelopathy or radiculopathy, thoracic region: Secondary | ICD-10-CM | POA: Diagnosis not present

## 2023-08-07 DIAGNOSIS — G8929 Other chronic pain: Secondary | ICD-10-CM | POA: Diagnosis not present

## 2023-08-07 DIAGNOSIS — M546 Pain in thoracic spine: Secondary | ICD-10-CM | POA: Diagnosis not present

## 2023-08-07 DIAGNOSIS — M545 Low back pain, unspecified: Secondary | ICD-10-CM | POA: Diagnosis not present

## 2023-08-07 DIAGNOSIS — M47812 Spondylosis without myelopathy or radiculopathy, cervical region: Secondary | ICD-10-CM | POA: Diagnosis not present

## 2023-08-07 DIAGNOSIS — M47816 Spondylosis without myelopathy or radiculopathy, lumbar region: Secondary | ICD-10-CM | POA: Diagnosis not present

## 2023-08-09 DIAGNOSIS — Z79891 Long term (current) use of opiate analgesic: Secondary | ICD-10-CM | POA: Diagnosis not present

## 2023-08-09 DIAGNOSIS — G894 Chronic pain syndrome: Secondary | ICD-10-CM | POA: Diagnosis not present

## 2023-08-09 DIAGNOSIS — M797 Fibromyalgia: Secondary | ICD-10-CM | POA: Diagnosis not present

## 2023-08-13 ENCOUNTER — Ambulatory Visit (INDEPENDENT_AMBULATORY_CARE_PROVIDER_SITE_OTHER): Payer: 59

## 2023-08-13 VITALS — BP 136/82 | HR 78 | Temp 97.8°F | Ht 64.0 in | Wt 186.0 lb

## 2023-08-13 DIAGNOSIS — E782 Mixed hyperlipidemia: Secondary | ICD-10-CM

## 2023-08-13 DIAGNOSIS — Z23 Encounter for immunization: Secondary | ICD-10-CM

## 2023-08-13 DIAGNOSIS — M797 Fibromyalgia: Secondary | ICD-10-CM

## 2023-08-13 DIAGNOSIS — R000 Tachycardia, unspecified: Secondary | ICD-10-CM | POA: Diagnosis not present

## 2023-08-13 DIAGNOSIS — K219 Gastro-esophageal reflux disease without esophagitis: Secondary | ICD-10-CM | POA: Diagnosis not present

## 2023-08-13 MED ORDER — CYCLOBENZAPRINE HCL 10 MG PO TABS: 10.0000 mg | ORAL_TABLET | Freq: Every evening | ORAL | 0 refills | Status: DC | PRN

## 2023-08-13 MED ORDER — PRAVASTATIN SODIUM 40 MG PO TABS: 40.0000 mg | ORAL_TABLET | Freq: Every day | ORAL | 1 refills | Status: DC

## 2023-08-13 MED ORDER — METOPROLOL TARTRATE 25 MG PO TABS: 25.0000 mg | ORAL_TABLET | Freq: Two times a day (BID) | ORAL | 0 refills | Status: DC

## 2023-08-13 NOTE — Patient Instructions (Addendum)
Let's trial PRAVASTATIN 40 mg half to start with, for the first couple weeks and then increase to full pill at bedtime As discussed, please work on lifestyle modifications Return in 3 months for blood work

## 2023-08-13 NOTE — Assessment & Plan Note (Signed)
On pantoprazole 40 mg daily with good results

## 2023-08-13 NOTE — Assessment & Plan Note (Signed)
Has mixed hyperlipidemia and hypertriglyceridemia. Could not tolerate rosuvastatin, bempedoic acid, Vascepa.  She is interested in trying another medication to help lower her cholesterol.  Plan: Will try pravastatin 40 mg daily.  Advised that she could start with half a pill at bedtime for the first couple weeks and then increase to full pill. -She will return for follow-up in 3 months and she could do fasting labs at that appointment Strongly recommended strict lifestyle changes, healthier diet, increased exercise to help lower her cholesterol.

## 2023-08-13 NOTE — Assessment & Plan Note (Signed)
She received her flu vaccine today.

## 2023-08-13 NOTE — Assessment & Plan Note (Signed)
States she has sinus tachycardia for which she takes metoprolol tartrate 25 mg twice daily. Requested for a refill on the medication which is done.

## 2023-08-13 NOTE — Assessment & Plan Note (Signed)
Margaret Wood is here with her husband for follow-up of her chronic medical problems.  Has fibromyalgia, gets tramadol through the pain clinic. She states she only takes 1 to 2 tablets and occasionally 3/day, takes a Flexeril daily  Plan: Continue care with pain clinic -Advised regular stretching and exercise program to help with her pain

## 2023-08-13 NOTE — Progress Notes (Signed)
Subjective:  Patient ID: Margaret Wood, female    DOB: 04/13/1961  Age: 62 y.o. MRN: [id number]  Chief Complaint  Patient presents with   Medical Management of Chronic Issues    HPI   Hyperlipidemia: Patient has tried and failed multiple medications for cholesterol including rosuvastatin, Vascepa, Zetia, and Nexlitol. All of these medications caused the patient to have dizziness.  Fibromyalgia: goes to the pain clinic. Could take 1-3 tabs of tramadol daily.  GERD: Taking Pantoprazole 40 mg 1 tablet daily.  Sinus Tachycardia: Taking Metoprolol tartrate 25 mg take 1 tablet twice daily.       08/13/2023    3:43 PM 02/06/2023   11:17 AM 08/02/2022    2:16 PM 01/30/2022    3:59 PM 02/10/2020    2:44 PM  Depression screen PHQ 2/9  Decreased Interest 0 0 0 1 1  Down, Depressed, Hopeless 0 1 0 1 1  PHQ - 2 Score 0 1 0 2 2  Altered sleeping 0  3 1 1   Tired, decreased energy 3  3 2 2   Change in appetite 0  1 1 0  Feeling bad or failure about yourself  0  0 1 1  Trouble concentrating 3  3 1 1   Moving slowly or fidgety/restless 0  0 0 0  Suicidal thoughts 0  0 0 0  PHQ-9 Score 6  10 8 7   Difficult doing work/chores Not difficult at all Somewhat difficult Somewhat difficult Somewhat difficult Somewhat difficult        08/13/2023    3:43 PM  Fall Risk   Falls in the past year? 0  Number falls in past yr: 0  Injury with Fall? 0  Risk for fall due to : No Fall Risks  Follow up Falls evaluation completed    Patient Care Team: Windell Moment, MD as PCP - General (Family Medicine)   Review of Systems  Constitutional:  Negative for appetite change, fatigue and fever.  HENT:  Negative for congestion, ear pain, sinus pressure and sore throat.   Respiratory:  Negative for cough, chest tightness, shortness of breath and wheezing.   Cardiovascular:  Negative for chest pain and palpitations.  Gastrointestinal:  Negative for abdominal pain, constipation, diarrhea, nausea and  vomiting.  Genitourinary:  Negative for dysuria and hematuria.  Musculoskeletal:  Negative for arthralgias, back pain, joint swelling and myalgias.  Skin:  Negative for rash.  Neurological:  Negative for dizziness, weakness and headaches.  Psychiatric/Behavioral:  Negative for dysphoric mood. The patient is not nervous/anxious.     Current Outpatient Medications on File Prior to Visit  Medication Sig Dispense Refill   hydrocortisone (ANUSOL-HC) 25 MG suppository Place 25 mg rectally 2 (two) times daily as needed for hemorrhoids or anal itching.     pantoprazole (PROTONIX) 40 MG tablet TAKE 1 TABLET BY MOUTH EVERY DAY AS NEEDED FOR GERD 90 tablet 0   promethazine (PHENERGAN) 12.5 MG tablet TAKE 1 TABLET(12.5 MG) BY MOUTH EVERY 6 HOURS AS NEEDED FOR NAUSEA OR VOMITING 30 tablet 0   traMADol (ULTRAM) 50 MG tablet Take 1 tablet (50 mg total) by mouth 3 (three) times daily as needed for moderate pain or severe pain. TAKE 1 TABLET BY MOUTH EVERY 8 HOURS 30 tablet 0   No current facility-administered medications on file prior to visit.   Past Medical History:  Diagnosis Date   Anxiety    Arthritis    Depression    Fibromyalgia    Gallstones  H/O [facility] spotted fever    IBS (irritable bowel syndrome)    Mitral valve prolapse    Palpitations    Scoliosis    Serotonin syndrome    Past Surgical History:  Procedure Laterality Date   APPENDECTOMY  1980   CESAREAN SECTION  1995   CHOLECYSTECTOMY  1997    Family History  Problem Relation Age of Onset   Depression Mother    Anxiety disorder Mother    Osteoarthritis Mother    Brain cancer Father        GBM   Alcohol abuse Sister    Anxiety disorder Sister    Diabetes Brother    Diverticulitis Brother    Heart disease Brother    Alcohol abuse Brother    Healthy Brother    Arrhythmia Paternal Uncle    Brain cancer Paternal Grandfather        GBM   Lung cancer Maternal Grandmother    Lung cancer Maternal Grandfather     Brain cancer Paternal Aunt    Colon cancer Neg Hx    Breast cancer Neg Hx    Ovarian cancer Neg Hx    Social History   Socioeconomic History   Marital status: Married    Spouse name: Jeannett Senior   Number of children: 2   Years of education: 12   Highest education level: Some college, no degree  Occupational History   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  Tobacco Use   Smoking status: Never   Smokeless tobacco: Never  Vaping Use   Vaping status: Never Used  Substance and Sexual Activity   Alcohol use: Not Currently   Drug use: No   Sexual activity: Yes    Partners: Male    Birth control/protection: None, Patch  Other Topics Concern   Not on file  Social History Narrative   02/10/20   From: From [place], [place]   Living: with husband stephen and one son   Work: no, disability       Family: 2 sons - Verdon Cummins and Sheria Lang (twin boys 1995)      Enjoys: reading, play banjo, crochet      Exercise: just got an exercise bike, and planning to walk when the weather is nicer   Diet: drinks diet soda, eats yogurt, trying to limit red meat - eating beyond beef      Safety   Seat belts: Yes    Guns: No   Safe in relationships: Yes    Social Determinants of Health   Financial Resource Strain: Low Risk  (08/13/2023)   Overall Financial Resource Strain (CARDIA)    Difficulty of Paying Living Expenses: Not hard at all  Food Insecurity: No Food Insecurity (08/13/2023)   Hunger Vital Sign    Worried About Running Out of Food in the Last Year: Never true    Ran Out of Food in the Last Year: Never true  Transportation Needs: No Transportation Needs (08/13/2023)   PRAPARE - Administrator, Civil Service (Medical): No    Lack of Transportation (Non-Medical): No  Physical Activity: Insufficiently Active (08/13/2023)   Exercise Vital Sign    Days of Exercise per Week: 3 days    Minutes of Exercise per Session: 30 min  Stress: Stress Concern  Present (08/13/2023)   [employer] of Occupational Health - Occupational Stress Questionnaire    Feeling of Stress : To some extent  Social Connections:  Unknown (08/13/2023)   Social Connection and Isolation Panel [NHANES]    Frequency of Communication with Friends and Family: Patient declined    Frequency of Social Gatherings with Friends and Family: Twice a week    Attends Religious Services: Never    Database administrator or Organizations: No    Attends Engineer, structural: Not on file    Marital Status: Married    Objective:  BP 136/82 (BP Location: Left Arm, Patient Position: Sitting)   Pulse 78   Temp 97.8 F (36.6 C) (Oral)   Ht 5\' 4"  (1.626 m)   Wt 186 lb (84.4 kg)   SpO2 98%   BMI 31.93 kg/m      08/13/2023    3:42 PM 02/12/2023    1:29 PM 02/06/2023   11:18 AM  BP/Weight  Systolic BP 136 120 124  Diastolic BP 82 70 72  Wt. (Lbs) 186 180.2 182  BMI 31.93 kg/m2 30.93 kg/m2 31.24 kg/m2    Physical Exam Vitals and nursing note reviewed.  Constitutional:      Appearance: Normal appearance.  HENT:     Head: Normocephalic and atraumatic.     Mouth/Throat:     Mouth: Mucous membranes are moist.     Pharynx: Oropharynx is clear.  Eyes:     Extraocular Movements: Extraocular movements intact.     Pupils: Pupils are equal, round, and reactive to light.  Cardiovascular:     Rate and Rhythm: Normal rate and regular rhythm.  Pulmonary:     Effort: Pulmonary effort is normal.     Breath sounds: Normal breath sounds.  Musculoskeletal:        General: Normal range of motion.  Skin:    General: Skin is warm and dry.  Neurological:     General: No focal deficit present.     Mental Status: She is alert and oriented to person, place, and time. Mental status is at baseline.  Psychiatric:        Mood and Affect: Mood normal.        Behavior: Behavior normal.     Diabetic Foot Exam - Simple   No data filed      Lab Results  Component Value Date    WBC 4.7 02/12/2023   HGB 13.9 02/12/2023   HCT 41.7 02/12/2023   PLT 281 02/12/2023   GLUCOSE 94 02/12/2023   CHOL 275 (H) 02/12/2023   TRIG 413 (H) 02/12/2023   HDL 47 02/12/2023   LDLDIRECT 73.0 01/30/2022   LDLCALC 151 (H) 02/12/2023   ALT 17 02/12/2023   AST 24 02/12/2023   NA 139 02/12/2023   K 5.1 02/12/2023   CL 100 02/12/2023   CREATININE 1.01 (H) 02/12/2023   BUN 13 02/12/2023   CO2 26 02/12/2023   TSH 1.830 02/12/2023   HGBA1C 5.1 02/23/2014      Assessment & Plan:    Fibromyalgia Assessment & Plan: Katy is here with her husband for follow-up of her chronic medical problems.  Has fibromyalgia, gets tramadol through the pain clinic. She states she only takes 1 to 2 tablets and occasionally 3/day, takes a Flexeril daily  Plan: Continue care with pain clinic -Advised regular stretching and exercise program to help with her pain   Mixed hyperlipidemia Assessment & Plan: Has mixed hyperlipidemia and hypertriglyceridemia. Could not tolerate rosuvastatin, bempedoic acid, Vascepa.  She is interested in trying another medication to help lower her cholesterol.  Plan: Will try pravastatin 40 mg  daily.  Advised that she could start with half a pill at bedtime for the first couple weeks and then increase to full pill. -She will return for follow-up in 3 months and she could do fasting labs at that appointment Strongly recommended strict lifestyle changes, healthier diet, increased exercise to help lower her cholesterol.   Gastroesophageal reflux disease, unspecified whether esophagitis present Assessment & Plan: On pantoprazole 40 mg daily with good results   Sinus tachycardia Assessment & Plan: States she has sinus tachycardia for which she takes metoprolol tartrate 25 mg twice daily. Requested for a refill on the medication which is done.  Orders: -     Metoprolol Tartrate; Take 1 tablet (25 mg total) by mouth 2 (two) times daily.  Dispense: 180 tablet;  Refill: 0  Encounter for immunization Assessment & Plan: She received her flu vaccine today  Orders: -     Influenza, MDCK, trivalent, PF(Flucelvax egg-free)  Other orders -     Pravastatin Sodium; Take 1 tablet (40 mg total) by mouth daily.  Dispense: 90 tablet; Refill: 1 -     Cyclobenzaprine HCl; Take 1 tablet (10 mg total) by mouth at bedtime as needed.  Dispense: 30 tablet; Refill: 0     Meds ordered this encounter  Medications   pravastatin (PRAVACHOL) 40 MG tablet    Sig: Take 1 tablet (40 mg total) by mouth daily.    Dispense:  90 tablet    Refill:  1    Okay to take in spite of Rosuvastatin intolerance.   metoprolol tartrate (LOPRESSOR) 25 MG tablet    Sig: Take 1 tablet (25 mg total) by mouth 2 (two) times daily.    Dispense:  180 tablet    Refill:  0   cyclobenzaprine (FLEXERIL) 10 MG tablet    Sig: Take 1 tablet (10 mg total) by mouth at bedtime as needed.    Dispense:  30 tablet    Refill:  0    Orders Placed This Encounter  Procedures   Influenza, MDCK, trivalent, PF(Flucelvax egg-free)     Follow-up: Return in about 3 months (around 11/12/2023).   I,Lauren M Auman,acting as a Neurosurgeon for [employer], MD.,have documented all relevant documentation on the behalf of Windell Moment, MD,as directed by  Windell Moment, MD while in the presence of Windell Moment, MD.   An After Visit Summary was printed and given to the patient.  Windell Moment, MD [facility] [phone]

## 2023-08-16 ENCOUNTER — Ambulatory Visit: Payer: 59 | Admitting: Family Medicine

## 2023-08-19 ENCOUNTER — Other Ambulatory Visit: Payer: Self-pay | Admitting: Family Medicine

## 2023-08-19 DIAGNOSIS — K219 Gastro-esophageal reflux disease without esophagitis: Secondary | ICD-10-CM

## 2023-09-19 ENCOUNTER — Other Ambulatory Visit: Payer: Self-pay

## 2023-09-20 ENCOUNTER — Other Ambulatory Visit: Payer: Self-pay | Admitting: Medical Genetics

## 2023-09-20 DIAGNOSIS — Z006 Encounter for examination for normal comparison and control in clinical research program: Secondary | ICD-10-CM

## 2023-10-18 ENCOUNTER — Other Ambulatory Visit (HOSPITAL_COMMUNITY)
Admission: RE | Admit: 2023-10-18 | Discharge: 2023-10-18 | Disposition: A | Discharge status: Home / Self Care | Payer: 59 | Source: Ambulatory Visit | Attending: Oncology | Admitting: Oncology

## 2023-10-18 DIAGNOSIS — Z006 Encounter for examination for normal comparison and control in clinical research program: Secondary | ICD-10-CM | POA: Insufficient documentation

## 2023-11-04 LAB — GENECONNECT MOLECULAR SCREEN: Genetic Analysis Overall Interpretation: NEGATIVE

## 2023-11-08 ENCOUNTER — Other Ambulatory Visit: Payer: Self-pay

## 2023-11-08 MED ORDER — HYDROCORTISONE ACETATE 25 MG RE SUPP: 25.0000 mg | Freq: Two times a day (BID) | RECTAL | 0 refills | Status: DC | PRN

## 2023-11-14 NOTE — Progress Notes (Signed)
Subjective:  Patient ID: Margaret Wood, female    DOB: [birth year]  Age: 62 y.o. MRN: [id number]  Chief Complaint  Patient presents with   Medical Management of Chronic Issues    HPI    The patient, with a history of fibromyalgia, hyperlipidemia, and gastroesophageal reflux disease, presents with a chief complaint of persistent and painful hemorrhoids. She reports that the hemorrhoids have been present for approximately a month, causing discomfort and bleeding during bowel movements. Over-the-counter hemorrhoid creams have been used without significant relief.  The patient also has a history of Irritable Bowel Syndrome (IBS), which is exacerbated by fiber intake, limiting her ability to manage the hemorrhoids with dietary modifications.  In addition to the hemorrhoids, the patient has been managing fibromyalgia with cyclobenzaprine and has a prescription for tramadol for pain management. She has been experiencing dizziness with various medications prescribed for hyperlipidemia, including Crestor, Vascepa, Zetia, Nexletol, and pravastatin.  The patient's gastroesophageal reflux is managed with rabeprazole, and she also takes metoprolol for sinus tachycardia. She reports no recent falls, and her blood pressure and heart rate are stable.  The patient's last blood work was in March of the current year, showing slightly elevated calcium levels, normal kidney function, and high cholesterol. The patient was not fasting at the time of the blood draw. The patient's thyroid function was normal, and there was no evidence of anemia.  The patient was raised as a Jehovah's Witness and, although no longer practicing, she would refuse blood transfusions and organ transplants due to her religious beliefs.    08/13/2023    3:43 PM 02/06/2023   11:17 AM 08/02/2022    2:16 PM 01/30/2022    3:59 PM 02/10/2020    2:44 PM  Depression screen PHQ 2/9  Decreased Interest 0 0 0 1 1  Down, Depressed, Hopeless 0 1 0  1 1  PHQ - 2 Score 0 1 0 2 2  Altered sleeping 0  3 1 1   Tired, decreased energy 3  3 2 2   Change in appetite 0  1 1 0  Feeling bad or failure about yourself  0  0 1 1  Trouble concentrating 3  3 1 1   Moving slowly or fidgety/restless 0  0 0 0  Suicidal thoughts 0  0 0 0  PHQ-9 Score 6  10 8 7   Difficult doing work/chores Not difficult at all Somewhat difficult Somewhat difficult Somewhat difficult Somewhat difficult        08/13/2023    3:43 PM  Fall Risk   Falls in the past year? 0  Number falls in past yr: 0  Injury with Fall? 0  Risk for fall due to : No Fall Risks  Follow up Falls evaluation completed    Patient Care Team: Windell Moment, MD as PCP - General (Family Medicine)   Review of Systems  Constitutional: Negative.   HENT: Negative.    Eyes: Negative.   Respiratory: Negative.    Cardiovascular: Negative.   Gastrointestinal:  Positive for blood in stool and rectal pain.  Genitourinary: Negative.   Musculoskeletal: Negative.   Neurological: Negative.     Current Outpatient Medications on File Prior to Visit  Medication Sig Dispense Refill   cyclobenzaprine (FLEXERIL) 10 MG tablet TAKE 1 TABLET BY MOUTH AT BEDTIME AS NEEDED. 30 tablet 0   hydrocortisone (ANUSOL-HC) 25 MG suppository Place 1 suppository (25 mg total) rectally 2 (two) times daily as needed for hemorrhoids or anal itching. 12 suppository  0   metoprolol tartrate (LOPRESSOR) 25 MG tablet Take 1 tablet (25 mg total) by mouth 2 (two) times daily. 180 tablet 0   pantoprazole (PROTONIX) 40 MG tablet TAKE 1 TABLET BY MOUTH EVERY DAY AS NEEDED FOR GERD 90 tablet 0   promethazine (PHENERGAN) 12.5 MG tablet TAKE 1 TABLET(12.5 MG) BY MOUTH EVERY 6 HOURS AS NEEDED FOR NAUSEA OR VOMITING 30 tablet 0   traMADol (ULTRAM) 50 MG tablet Take 1 tablet (50 mg total) by mouth 3 (three) times daily as needed for moderate pain or severe pain. TAKE 1 TABLET BY MOUTH EVERY 8 HOURS 30 tablet 0   No current  facility-administered medications on file prior to visit.   Past Medical History:  Diagnosis Date   Anxiety    Arthritis    Depression    Fibromyalgia    Gallstones    H/O [facility] spotted fever    IBS (irritable bowel syndrome)    Mitral valve prolapse    Palpitations    Scoliosis    Serotonin syndrome    Past Surgical History:  Procedure Laterality Date   APPENDECTOMY  1980   CESAREAN SECTION  1995   CHOLECYSTECTOMY  1997    Family History  Problem Relation Age of Onset   Depression Mother    Anxiety disorder Mother    Osteoarthritis Mother    Brain cancer Father        GBM   Alcohol abuse Sister    Anxiety disorder Sister    Diabetes Brother    Diverticulitis Brother    Heart disease Brother    Alcohol abuse Brother    Healthy Brother    Arrhythmia Paternal Uncle    Brain cancer Paternal Grandfather        GBM   Lung cancer Maternal Grandmother    Lung cancer Maternal Grandfather    Brain cancer Paternal Aunt    Colon cancer Neg Hx    Breast cancer Neg Hx    Ovarian cancer Neg Hx    Social History   Socioeconomic History   Marital status: Married    Spouse name: Jeannett Senior   Number of children: 2   Years of education: 12   Highest education level: GED or equivalent  Occupational History   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  Tobacco Use   Smoking status: Never   Smokeless tobacco: Never  Vaping Use   Vaping status: Never Used  Substance and Sexual Activity   Alcohol use: Not Currently   Drug use: No   Sexual activity: Yes    Partners: Male    Birth control/protection: None, Patch  Other Topics Concern   Not on file  Social History Narrative   02/10/20   From: From [place], [place]   Living: with husband stephen and one son   Work: no, disability       Family: 2 sons - Verdon Cummins and Sheria Lang (twin boys 1995)      Enjoys: reading, play banjo, crochet      Exercise: just got an exercise bike, and  planning to walk when the weather is nicer   Diet: drinks diet soda, eats yogurt, trying to limit red meat - eating beyond beef      Safety   Seat belts: Yes    Guns: No   Safe in relationships: Yes    Social Drivers of Health   Financial Resource Strain: Low Risk  (11/15/2023)  Overall Financial Resource Strain (CARDIA)    Difficulty of Paying Living Expenses: Not very hard  Food Insecurity: No Food Insecurity (11/15/2023)   Hunger Vital Sign    Worried About Running Out of Food in the Last Year: Never true    Ran Out of Food in the Last Year: Never true  Transportation Needs: No Transportation Needs (11/15/2023)   PRAPARE - Administrator, Civil Service (Medical): No    Lack of Transportation (Non-Medical): No  Physical Activity: Insufficiently Active (11/15/2023)   Exercise Vital Sign    Days of Exercise per Week: 3 days    Minutes of Exercise per Session: 30 min  Stress: Stress Concern Present (11/15/2023)   [employer] of Occupational Health - Occupational Stress Questionnaire    Feeling of Stress : Very much  Social Connections: Unknown (11/15/2023)   Social Connection and Isolation Panel [NHANES]    Frequency of Communication with Friends and Family: Patient declined    Frequency of Social Gatherings with Friends and Family: Twice a week    Attends Religious Services: Never    Diplomatic Services operational officer: No    Attends Engineer, structural: Not on file    Marital Status: Married    Objective:  BP 108/60 (BP Location: Right Arm, Patient Position: Sitting, Cuff Size: Large)   Pulse 87   Temp 98.7 F (37.1 C) (Temporal)   Resp 16   Ht 5\' 4"  (1.626 m)   Wt 191 lb 6.4 oz (86.8 kg)   SpO2 99%   BMI 32.85 kg/m      11/15/2023    3:15 PM 08/13/2023    3:42 PM 02/12/2023    1:29 PM  BP/Weight  Systolic BP 108 136 120  Diastolic BP 60 82 70  Wt. (Lbs) 191.4 186 180.2  BMI 32.85 kg/m2 31.93 kg/m2 30.93 kg/m2    Physical  Exam Nursing note reviewed.  Constitutional:      Appearance: Normal appearance.  HENT:     Head: Atraumatic.  Eyes:     Pupils: Pupils are equal, round, and reactive to light.  Cardiovascular:     Rate and Rhythm: Normal rate and regular rhythm.  Pulmonary:     Effort: Pulmonary effort is normal.     Breath sounds: Normal breath sounds.  Musculoskeletal:        General: Normal range of motion.  Neurological:     General: No focal deficit present.     Mental Status: She is alert.  Psychiatric:        Mood and Affect: Mood normal.     Diabetic Foot Exam - Simple   No data filed      Lab Results  Component Value Date   WBC 4.7 02/12/2023   HGB 13.9 02/12/2023   HCT 41.7 02/12/2023   PLT 281 02/12/2023   GLUCOSE 94 02/12/2023   CHOL 275 (H) 02/12/2023   TRIG 413 (H) 02/12/2023   HDL 47 02/12/2023   LDLDIRECT 73.0 01/30/2022   LDLCALC 151 (H) 02/12/2023   ALT 17 02/12/2023   AST 24 02/12/2023   NA 139 02/12/2023   K 5.1 02/12/2023   CL 100 02/12/2023   CREATININE 1.01 (H) 02/12/2023   BUN 13 02/12/2023   CO2 26 02/12/2023   TSH 1.830 02/12/2023   HGBA1C 5.1 02/23/2014      Assessment & Plan:    External hemorrhoid, bleeding Assessment & Plan: Hemorrhoids: External hemorrhoids causing discomfort and bleeding.  Discussed the use of Anusol with hydrocortisone cream to shrink the hemorrhoids and provide numbing relief. -Prescribe Anusol with hydrocortisone cream, apply twice daily for a few weeks. -Advise sitz baths and increased water intake. She states she cannot take Fiber supplements due to diarrhea from IBS. -Consider referral to a surgeon if no improvement after a few weeks.    Mixed hyperlipidemia Assessment & Plan: Hyperlipidemia: High cholesterol levels despite previous trials of Crestor, Vascepa, Zetia, and Niaspan, which caused dizziness. Discussed the importance of cholesterol control in reducing the risk of heart attacks and stroke. -Retry  pravastatin at bedtime,sent a refill to her pharmacy. Advised to monitor for dizziness. -Plan for fasting blood work in three months to reassess cholesterol levels. - continue aggressive lifestyle changes including healthy diet and exercise as tolerated   Encounter for immunization [employer] Comirnaty Covid-19 Vaccine 24yrs & older  Other orders -     Hydrocortisone (Perianal); Place 1 Application rectally 2 (two) times daily.  Dispense: 270 g; Refill: 0     Meds ordered this encounter  Medications   hydrocortisone (ANUSOL-HC) 2.5 % rectal cream    Sig: Place 1 Application rectally 2 (two) times daily.    Dispense:  270 g    Refill:  0    Orders Placed This Encounter  Procedures   [employer] Comirnaty Covid-19 Vaccine 45yrs & older     Follow-up: Return if symptoms worsen or fail to improve.  An After Visit Summary was printed and given to the patient.  Windell Moment, MD [facility] [phone]

## 2023-11-15 ENCOUNTER — Ambulatory Visit: Payer: 59

## 2023-11-15 VITALS — BP 108/60 | HR 87 | Temp 98.7°F | Resp 16 | Ht 64.0 in | Wt 191.4 lb

## 2023-11-15 DIAGNOSIS — E782 Mixed hyperlipidemia: Secondary | ICD-10-CM

## 2023-11-15 DIAGNOSIS — K644 Residual hemorrhoidal skin tags: Secondary | ICD-10-CM

## 2023-11-15 DIAGNOSIS — Z23 Encounter for immunization: Secondary | ICD-10-CM

## 2023-11-15 MED ORDER — HYDROCORTISONE (PERIANAL) 2.5 % EX CREA: 1.0000 | TOPICAL_CREAM | Freq: Two times a day (BID) | CUTANEOUS | 0 refills | Status: AC

## 2023-11-15 NOTE — Patient Instructions (Signed)
VISIT SUMMARY:  During today's visit, we discussed your ongoing issues with hemorrhoids, hyperlipidemia, and irritable bowel syndrome (IBS). We also reviewed your current medications and general health maintenance.  YOUR PLAN:  -HEMORRHOIDS: Hemorrhoids are swollen veins in the lower rectum and anus that can cause discomfort and bleeding. We have prescribed Anusol with hydrocortisone cream to be applied twice daily for a few weeks to help shrink the hemorrhoids and provide relief. Additionally, we recommend sitz baths and increasing your water intake. If there is no improvement after a few weeks, we may consider referring you to a surgeon.  -HYPERLIPIDEMIA: Hyperlipidemia is a condition of having high cholesterol levels, which can increase the risk of heart attacks and strokes. We will retry pravastatin at bedtime and monitor for any dizziness. We will also plan for fasting blood work in three months to reassess your cholesterol levels.  -IRRITABLE BOWEL SYNDROME (IBS): IBS is a gastrointestinal disorder that causes symptoms like cramping, abdominal pain, bloating, gas, and diarrhea or constipation. Since fiber exacerbates your symptoms, continue with your current management plan and avoid fiber supplements.  -SINUS TACHYCARDIA: Sinus tachycardia is a condition where the heart beats faster than normal. It is currently managed with metoprolol, and we will refill your prescription for metoprolol 25mg  to be taken twice daily.  -GENERAL HEALTH MAINTENANCE: We administered the COVID vaccine today. Your last blood work showed slightly elevated calcium levels, normal kidney function, and high cholesterol. Your thyroid function was normal, and there was no evidence of anemia. We will plan for a wellness exam next year.  INSTRUCTIONS:  Please apply Anusol with hydrocortisone cream twice daily for your hemorrhoids and increase your water intake. If there is no improvement after a few weeks, we may refer you  to a surgeon. Take pravastatin at bedtime and monitor for any dizziness. We will do fasting blood work in three months to reassess your cholesterol levels. Continue with your current management plan for IBS and avoid fiber supplements. Refill your metoprolol prescription and take it as directed. We administered the COVID vaccine today, and we will plan for a wellness exam next year.

## 2023-11-16 NOTE — Assessment & Plan Note (Signed)
Hyperlipidemia: High cholesterol levels despite previous trials of Crestor, Vascepa, Zetia, and Niaspan, which caused dizziness. Discussed the importance of cholesterol control in reducing the risk of heart attacks and stroke. -Retry pravastatin at bedtime,sent a refill to her pharmacy. Advised to monitor for dizziness. -Plan for fasting blood work in three months to reassess cholesterol levels. - continue aggressive lifestyle changes including healthy diet and exercise as tolerated

## 2023-11-16 NOTE — Assessment & Plan Note (Signed)
Hemorrhoids: External hemorrhoids causing discomfort and bleeding. Discussed the use of Anusol with hydrocortisone cream to shrink the hemorrhoids and provide numbing relief. -Prescribe Anusol with hydrocortisone cream, apply twice daily for a few weeks. -Advise sitz baths and increased water intake. She states she cannot take Fiber supplements due to diarrhea from IBS. -Consider referral to a surgeon if no improvement after a few weeks.

## 2023-11-28 ENCOUNTER — Ambulatory Visit: Payer: 59 | Admitting: Family Medicine

## 2023-11-29 ENCOUNTER — Other Ambulatory Visit: Payer: Self-pay

## 2023-11-30 DIAGNOSIS — Z1211 Encounter for screening for malignant neoplasm of colon: Secondary | ICD-10-CM

## 2023-11-30 MED ORDER — HYDROCORTISONE ACETATE 25 MG RE SUPP: 25.0000 mg | Freq: Two times a day (BID) | RECTAL | 0 refills | Status: DC | PRN

## 2023-12-07 ENCOUNTER — Encounter: Payer: Self-pay | Admitting: Internal Medicine

## 2023-12-31 ENCOUNTER — Ambulatory Visit (AMBULATORY_SURGERY_CENTER): Payer: Medicaid Other | Admitting: *Deleted

## 2023-12-31 VITALS — Ht 64.0 in | Wt 190.0 lb

## 2023-12-31 DIAGNOSIS — Z1211 Encounter for screening for malignant neoplasm of colon: Secondary | ICD-10-CM

## 2023-12-31 MED ORDER — SUFLAVE 178.7 G PO SOLR: 1.0000 | Freq: Once | ORAL | 0 refills | Status: AC

## 2023-12-31 NOTE — Progress Notes (Signed)
Pt's name and DOB verified at the beginning of the pre-visit wit 2 identifiers  Pt denies any difficulty with ambulating,sitting, laying down or rolling side to side  Pt has no issues with ambulation   Pt has no issues moving head neck or swallowing  No egg or soy allergy known to patient   No issues known to pt with past sedation with any surgeries or procedures  Pt denies having issues being intubated  No FH of Malignant Hyperthermia  Pt is not on diet pills or shots  Pt is not on home 02   Pt is not on blood thinners   Pt denies issues with constipation   Pt is not on dialysis  Pt hx of ST  Pt denies any upcoming cardiac testing  Pt encouraged to use to use Singlecare or Goodrx to reduce cost   Patient's chart reviewed by Cathlyn Parsons CNRA prior to pre-visit and patient appropriate for the [facility].  Pre-visit completed and red dot placed by patient's name on their procedure day (on provider's schedule).  .  Visit by phone  Pt states weight is 190 lb  Instructed pt why it is important to and  to call if they have any changes in health or new medications. Directed them to the # given and on instructions.     Instructions reviewed. Pt given both  [facility] main # and MD on call # prior to instructions.  Pt states understanding. Instructed to review again prior to procedure. Pt states they will.   Informed pt that they will receive a text  call from [facility] regarding there prep med.

## 2024-01-16 ENCOUNTER — Encounter: Payer: 59 | Admitting: Internal Medicine

## 2024-02-08 ENCOUNTER — Ambulatory Visit: Payer: 59 | Admitting: Internal Medicine

## 2024-02-08 ENCOUNTER — Encounter: Payer: Self-pay | Admitting: Internal Medicine

## 2024-02-08 VITALS — BP 140/80 | HR 90 | Temp 98.6°F | Resp 15 | Ht 64.0 in | Wt 190.0 lb

## 2024-02-08 DIAGNOSIS — Z1211 Encounter for screening for malignant neoplasm of colon: Secondary | ICD-10-CM | POA: Diagnosis not present

## 2024-02-08 DIAGNOSIS — K648 Other hemorrhoids: Secondary | ICD-10-CM | POA: Diagnosis not present

## 2024-02-08 DIAGNOSIS — K5730 Diverticulosis of large intestine without perforation or abscess without bleeding: Secondary | ICD-10-CM

## 2024-02-08 DIAGNOSIS — F419 Anxiety disorder, unspecified: Secondary | ICD-10-CM | POA: Diagnosis not present

## 2024-02-08 DIAGNOSIS — F32A Depression, unspecified: Secondary | ICD-10-CM | POA: Diagnosis not present

## 2024-02-08 DIAGNOSIS — M797 Fibromyalgia: Secondary | ICD-10-CM | POA: Diagnosis not present

## 2024-02-08 MED ORDER — SODIUM CHLORIDE 0.9 % IV SOLN: 500.0000 mL | Freq: Once | INTRAVENOUS | Status: DC

## 2024-02-08 NOTE — Progress Notes (Signed)
 HISTORY OF PRESENT ILLNESS:  Margaret Wood is a 63 y.o. female who presents today for screening colonoscopy.  No complaints  REVIEW OF SYSTEMS:  All non-GI ROS negative except for  Past Medical History:  Diagnosis Date   Anxiety    Arthritis    Depression    Fibromyalgia    Gallstones    GERD (gastroesophageal reflux disease)    H/O [facility] spotted fever    Hyperlipidemia    IBS (irritable bowel syndrome)    Mitral valve prolapse    Palpitations    Scoliosis    Serotonin syndrome     Past Surgical History:  Procedure Laterality Date   APPENDECTOMY  1980   CESAREAN SECTION  1995   CHOLECYSTECTOMY  1997    Social History JANELLA ROGALA  reports that she has never smoked. She has never used smokeless tobacco. She reports that she does not currently use alcohol. She reports that she does not use drugs.  family history includes Alcohol abuse in her brother and sister; Anxiety disorder in her mother and sister; Arrhythmia in her paternal uncle; Brain cancer in her father, paternal aunt, and paternal grandfather; Depression in her mother; Diabetes in her brother; Diverticulitis in her brother; Healthy in her brother; Heart disease in her brother; Lung cancer in her maternal grandfather and maternal grandmother; Osteoarthritis in her mother.  Allergies  Allergen Reactions   Cymbalta [Duloxetine Hcl] Other (See Comments)    SOB, palpitations, nausea   Amoxicillin Nausea Only   Bempedoic Acid-Ezetimibe Other (See Comments)    Dizziness   Icosapent Ethyl (Epa Ethyl Ester) (Fish) Other (See Comments)    Dizziness   Other Other (See Comments)    Cannot take anything extended release per pt   Rosuvastatin Other (See Comments)    dizziness   Tegretol [Carbamazepine] Other (See Comments)    Made her ill       PHYSICAL EXAMINATION: Vital signs: BP 129/79   Pulse 87   Temp 98.6 F (37 C)   Ht 5\' 4"  (1.626 m)   Wt 190 lb (86.2 kg)   SpO2 97%   BMI 32.61  kg/m  General: Well-developed, well-nourished, no acute distress HEENT: Sclerae are anicteric, conjunctiva pink. Oral mucosa intact Lungs: Clear Heart: Regular Abdomen: soft, nontender, nondistended, no obvious ascites, no peritoneal signs, normal bowel sounds. No organomegaly. Extremities: No edema Psychiatric: alert and oriented x3. Cooperative     ASSESSMENT:  Colon cancer screening   PLAN:   Screening colonoscopy

## 2024-02-08 NOTE — Op Note (Signed)
 Margaret Wood Patient Name: Margaret Wood Procedure Date: 02/08/2024 3:29 PM MRN: [id number] Endoscopist: Wilhemina Bonito. Marina Goodell , MD, [id number] Age: 63 Referring MD:  Date of Birth: 1961-08-23 Gender: Female Account #: [id number] Procedure:                Colonoscopy Indications:              Screening for colorectal malignant neoplasm Medicines:                Monitored Anesthesia Care Procedure:                Pre-Anesthesia Assessment:                           - Prior to the procedure, a History and Physical                            was performed, and patient medications and                            allergies were reviewed. The patient's tolerance of                            previous anesthesia was also reviewed. The risks                            and benefits of the procedure and the sedation                            options and risks were discussed with the patient.                            All questions were answered, and informed consent                            was obtained. Prior Anticoagulants: The patient has                            taken no anticoagulant or antiplatelet agents.                            After reviewing the risks and benefits, the patient                            was deemed in satisfactory condition to undergo the                            procedure.                           After obtaining informed consent, the colonoscope                            was passed under direct vision. Throughout the  procedure, the patient's blood pressure, pulse, and                            oxygen saturations were monitored continuously. The                            Olympus Scope [id number] was introduced through the                            anus and advanced to the the cecum, identified by                            appendiceal orifice and ileocecal valve. The                            ileocecal valve, appendiceal  orifice, and rectum                            were photographed. The quality of the bowel                            preparation was excellent. The colonoscopy was                            performed without difficulty. The patient tolerated                            the procedure well. The bowel preparation used was                            SUPREP via split dose instruction. Scope In: 3:40:47 PM Scope Out: 3:51:45 PM Scope Withdrawal Time: 0 hours 9 minutes 4 seconds  Total Procedure Duration: 0 hours 10 minutes 58 seconds  Findings:                 Multiple diverticula were found in the left colon.                           Internal hemorrhoids were found during retroflexion.                           The exam was otherwise without abnormality on                            direct and retroflexion views. Complications:            No immediate complications. Estimated blood loss:                            None. Estimated Blood Loss:     Estimated blood loss: none. Recommendation:           - Repeat colonoscopy in 10 years for screening                            purposes.                           -  Patient has a contact number available for                            emergencies. The signs and symptoms of potential                            delayed complications were discussed with the                            patient. Return to normal activities tomorrow.                            Written discharge instructions were provided to the                            patient.                           - Resume previous diet.                           - Continue present medications. Wilhemina Bonito. Marina Goodell, MD 02/08/2024 3:55:57 PM This report has been signed electronically.

## 2024-02-08 NOTE — Progress Notes (Signed)
 Pt's states no medical or surgical changes since previsit or office visit.

## 2024-02-08 NOTE — Patient Instructions (Signed)
-  Handout on diverticulosis and hemorrhoids provided -repeat colonoscopy for surveillance in 10 years recommended -Continue present medications    YOU HAD AN ENDOSCOPIC PROCEDURE TODAY AT THE [facility]:   Refer to the procedure report that was given to you for any specific questions about what was found during the examination.  If the procedure report does not answer your questions, please call your gastroenterologist to clarify.  If you requested that your care partner not be given the details of your procedure findings, then the procedure report has been included in a sealed envelope for you to review at your convenience later.  YOU SHOULD EXPECT: Some feelings of bloating in the abdomen. Passage of more gas than usual.  Walking can help get rid of the air that was put into your GI tract during the procedure and reduce the bloating. If you had a lower endoscopy (such as a colonoscopy or flexible sigmoidoscopy) you may notice spotting of blood in your stool or on the toilet paper. If you underwent a bowel prep for your procedure, you may not have a normal bowel movement for a few days.  Please Note:  You might notice some irritation and congestion in your nose or some drainage.  This is from the oxygen used during your procedure.  There is no need for concern and it should clear up in a day or so.  SYMPTOMS TO REPORT IMMEDIATELY:  Following lower endoscopy (colonoscopy or flexible sigmoidoscopy):  Excessive amounts of blood in the stool  Significant tenderness or worsening of abdominal pains  Swelling of the abdomen that is new, acute  Fever of 100F or higher  For urgent or emergent issues, a gastroenterologist can be reached at any hour by calling [phone]. Do not use MyChart messaging for urgent concerns.    DIET:  We do recommend a small meal at first, but then you may proceed to your regular diet.  Drink plenty of fluids but you should avoid alcoholic beverages for 24  hours.  ACTIVITY:  You should plan to take it easy for the rest of today and you should NOT DRIVE or use heavy machinery until tomorrow (because of the sedation medicines used during the test).    FOLLOW UP: Our staff will call the number listed on your records the next business day following your procedure.  We will call around 7:15- 8:00 am to check on you and address any questions or concerns that you may have regarding the information given to you following your procedure. If we do not reach you, we will leave a message.     If any biopsies were taken you will be contacted by phone or by letter within the next 1-3 weeks.  Please call us at [phone] if you have not heard about the biopsies in 3 weeks.    SIGNATURES/CONFIDENTIALITY: You and/or your care partner have signed paperwork which will be entered into your electronic medical record.  These signatures attest to the fact that that the information above on your After Visit Summary has been reviewed and is understood.  Full responsibility of the confidentiality of this discharge information lies with you and/or your care-partner.

## 2024-02-08 NOTE — Progress Notes (Signed)
 Sedate, gd SR, tolerated procedure well, VSS, report to RN

## 2024-02-11 ENCOUNTER — Telehealth: Payer: Self-pay

## 2024-02-11 NOTE — Telephone Encounter (Signed)
  Follow up Call-     02/08/2024    2:56 PM  Call back number  Post procedure Call Back phone  # [phone]  Permission to leave phone message Yes     Left message

## 2024-02-15 ENCOUNTER — Other Ambulatory Visit: Payer: Self-pay | Admitting: Family Medicine

## 2024-02-15 ENCOUNTER — Other Ambulatory Visit: Payer: Self-pay

## 2024-02-18 ENCOUNTER — Ambulatory Visit: Payer: 59

## 2024-02-18 MED ORDER — HYDROCORTISONE ACETATE 25 MG RE SUPP: 25.0000 mg | Freq: Two times a day (BID) | RECTAL | 5 refills | Status: AC | PRN

## 2024-03-17 ENCOUNTER — Ambulatory Visit (INDEPENDENT_AMBULATORY_CARE_PROVIDER_SITE_OTHER)

## 2024-03-17 VITALS — BP 106/68 | HR 85 | Temp 98.1°F | Resp 16 | Ht 64.0 in | Wt 191.4 lb

## 2024-03-17 DIAGNOSIS — Z1231 Encounter for screening mammogram for malignant neoplasm of breast: Secondary | ICD-10-CM | POA: Diagnosis not present

## 2024-03-17 DIAGNOSIS — M542 Cervicalgia: Secondary | ICD-10-CM | POA: Diagnosis not present

## 2024-03-17 DIAGNOSIS — G8929 Other chronic pain: Secondary | ICD-10-CM

## 2024-03-17 DIAGNOSIS — M797 Fibromyalgia: Secondary | ICD-10-CM | POA: Diagnosis not present

## 2024-03-17 DIAGNOSIS — E782 Mixed hyperlipidemia: Secondary | ICD-10-CM | POA: Diagnosis not present

## 2024-03-17 DIAGNOSIS — K644 Residual hemorrhoidal skin tags: Secondary | ICD-10-CM | POA: Diagnosis not present

## 2024-03-17 DIAGNOSIS — R5383 Other fatigue: Secondary | ICD-10-CM

## 2024-03-17 DIAGNOSIS — R000 Tachycardia, unspecified: Secondary | ICD-10-CM | POA: Diagnosis not present

## 2024-03-17 MED ORDER — METOPROLOL TARTRATE 25 MG PO TABS: 25.0000 mg | ORAL_TABLET | Freq: Two times a day (BID) | ORAL | 3 refills | Status: AC

## 2024-03-17 MED ORDER — TRAMADOL HCL 50 MG PO TABS
50.0000 mg | ORAL_TABLET | Freq: Two times a day (BID) | ORAL | 0 refills | Status: DC | PRN
Start: 2024-03-17 — End: 2024-06-05

## 2024-03-17 NOTE — Progress Notes (Signed)
 Subjective:  Patient ID: Margaret Wood, female    DOB: 03/03/1961  Age: 63 y.o. MRN: [id number]  Chief Complaint  Patient presents with   Medical Management of Chronic Issues    Discussed the use of AI scribe software for clinical note transcription with the patient, who gave verbal consent to proceed.   Discussed the use of AI scribe software for clinical note transcription with the patient, who gave verbal consent to proceed.  History of Present Illness   Margaret FLEISHMAN "Teddy Fear" is a 63 year old female who presents with persistent hemorrhoid symptoms.  She has ongoing issues with both external and internal hemorrhoids, characterized by itching, burning, significant pain, and regular bleeding. Despite previous treatments including hydrocortisone  aerosol, sitz baths, and increased water intake, her symptoms have not improved. She was unable to take fiber supplements due to diarrhea from IBS. A colonoscopy confirmed the presence of hemorrhoids, but she has not yet seen a Careers adviser.  She has a history of diverticulosis, which she understands may cause painless bleeding or severe pain if it progresses to diverticulitis. However, she does not report current symptoms related to diverticulosis.  She experiences chronic neck pain, described as 'awful', associated with fibromyalgia. She also reports dizziness and seeing stars upon standing, which she attributes to POTS. She experiences fatigue and has a history of elevated ANA and markers of inflammation, suggesting possible autoimmune activity. She has not been diagnosed with lupus but notes symptoms such as a 'bright red' rash across her nose and cheeks.  Her medication regimen includes metoprolol , which she takes regularly to manage her heart rate, and pravastatin , which she had paused due to pain but resumed after determining the pain was unrelated. She also uses tramadol  for pain management, though she tries to limit its use.            08/13/2023    3:43 PM 02/06/2023   11:17 AM 08/02/2022    2:16 PM 01/30/2022    3:59 PM 02/10/2020    2:44 PM  Depression screen PHQ 2/9  Decreased Interest 0 0 0 1 1  Down, Depressed, Hopeless 0 1 0 1 1  PHQ - 2 Score 0 1 0 2 2  Altered sleeping 0  3 1 1   Tired, decreased energy 3  3 2 2   Change in appetite 0  1 1 0  Feeling bad or failure about yourself  0  0 1 1  Trouble concentrating 3  3 1 1   Moving slowly or fidgety/restless 0  0 0 0  Suicidal thoughts 0  0 0 0  PHQ-9 Score 6  10 8 7   Difficult doing work/chores Not difficult at all Somewhat difficult Somewhat difficult Somewhat difficult Somewhat difficult        08/13/2023    3:43 PM  Fall Risk   Falls in the past year? 0  Number falls in past yr: 0  Injury with Fall? 0  Risk for fall due to : No Fall Risks  Follow up Falls evaluation completed    Patient Care Team: Olanrewaju Osborn, MD as PCP - General (Family Medicine)   Review of Systems  Constitutional:  Negative for chills, diaphoresis, fatigue and fever.  HENT:  Negative for congestion, ear pain and sinus pain.   Eyes: Negative.   Respiratory:  Negative for cough and shortness of breath.   Cardiovascular:  Negative for chest pain.  Gastrointestinal:  Negative for abdominal pain, constipation, nausea and vomiting.  Endocrine: Negative.  Genitourinary:  Negative for dysuria.  Musculoskeletal:  Negative for arthralgias.  Allergic/Immunologic: Negative.   Neurological:  Negative for weakness and headaches.  Hematological: Negative.   Psychiatric/Behavioral:  Negative for dysphoric mood. The patient is not nervous/anxious.     Current Outpatient Medications on File Prior to Visit  Medication Sig Dispense Refill   cyclobenzaprine  (FLEXERIL ) 10 MG tablet TAKE 1 TABLET BY MOUTH AT BEDTIME AS NEEDED. 30 tablet 0   hydrocortisone  (ANUSOL -HC) 2.5 % rectal cream Place 1 Application rectally 2 (two) times daily. 270 g 0   hydrocortisone  (ANUSOL -HC) 25 MG suppository  Place 1 suppository (25 mg total) rectally 2 (two) times daily as needed for hemorrhoids or anal itching. 12 suppository 5   pantoprazole  (PROTONIX ) 40 MG tablet TAKE 1 TABLET BY MOUTH EVERY DAY AS NEEDED FOR GERD 90 tablet 0   promethazine  (PHENERGAN ) 12.5 MG tablet TAKE 1 TABLET(12.5 MG) BY MOUTH EVERY 6 HOURS AS NEEDED FOR NAUSEA OR VOMITING 30 tablet 0   No current facility-administered medications on file prior to visit.   Past Medical History:  Diagnosis Date   Anxiety    Arthritis    Depression    Fibromyalgia    Gallstones    GERD (gastroesophageal reflux disease)    H/O [facility] spotted fever    Hyperlipidemia    IBS (irritable bowel syndrome)    Mitral valve prolapse    Palpitations    Scoliosis    Serotonin syndrome    Past Surgical History:  Procedure Laterality Date   APPENDECTOMY  1980   CESAREAN SECTION  1995   CHOLECYSTECTOMY  1997    Family History  Problem Relation Age of Onset   Depression Mother    Anxiety disorder Mother    Osteoarthritis Mother    Brain cancer Father        GBM   Alcohol abuse Sister    Anxiety disorder Sister    Diabetes Brother    Diverticulitis Brother    Heart disease Brother    Alcohol abuse Brother    Healthy Brother    Brain cancer Paternal Aunt    Arrhythmia Paternal Uncle    Lung cancer Maternal Grandmother    Lung cancer Maternal Grandfather    Brain cancer Paternal Grandfather        GBM   Colon cancer Neg Hx    Breast cancer Neg Hx    Ovarian cancer Neg Hx    Colon polyps Neg Hx    Esophageal cancer Neg Hx    Rectal cancer Neg Hx    Stomach cancer Neg Hx    Social History   Socioeconomic History   Marital status: Married    Spouse name: Mara Seminole   Number of children: 2   Years of education: 12   Highest education level: GED or equivalent  Occupational History   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  Tobacco Use   Smoking status: Never   Smokeless  tobacco: Never  Vaping Use   Vaping status: Never Used  Substance and Sexual Activity   Alcohol use: Not Currently   Drug use: No   Sexual activity: Yes    Partners: Male    Birth control/protection: None, Patch, Post-menopausal  Other Topics Concern   Not on file  Social History Narrative   02/10/20   From: From [place], [place]   Living: with husband stephen and one son   Work: no, disability  Family: 2 sons - Aleta Anda and Donelda Fujita (twin boys 1995)      Enjoys: reading, play banjo, crochet      Exercise: just got an exercise bike, and planning to walk when the weather is nicer   Diet: drinks diet soda, eats yogurt, trying to limit red meat - eating beyond beef      Safety   Seat belts: Yes    Guns: No   Safe in relationships: Yes    Social Drivers of Corporate investment banker Strain: Low Risk  (11/15/2023)   Overall Financial Resource Strain (CARDIA)    Difficulty of Paying Living Expenses: Not very hard  Food Insecurity: No Food Insecurity (11/15/2023)   Hunger Vital Sign    Worried About Running Out of Food in the Last Year: Never true    Ran Out of Food in the Last Year: Never true  Transportation Needs: No Transportation Needs (11/15/2023)   PRAPARE - Administrator, Civil Service (Medical): No    Lack of Transportation (Non-Medical): No  Physical Activity: Insufficiently Active (11/15/2023)   Exercise Vital Sign    Days of Exercise per Week: 3 days    Minutes of Exercise per Session: 30 min  Stress: Stress Concern Present (11/15/2023)   [employer] of Occupational Health - Occupational Stress Questionnaire    Feeling of Stress : Very much  Social Connections: Unknown (11/15/2023)   Social Connection and Isolation Panel [NHANES]    Frequency of Communication with Friends and Family: Patient declined    Frequency of Social Gatherings with Friends and Family: Twice a week    Attends Religious Services: Never    Programmer, systems: No    Attends Engineer, structural: Not on file    Marital Status: Married    Objective:  BP 106/68   Pulse 85   Temp 98.1 F (36.7 C) (Temporal)   Resp 16   Ht 5\' 4"  (1.626 m)   Wt 191 lb 6.4 oz (86.8 kg)   SpO2 96%   BMI 32.85 kg/m      03/17/2024    3:34 PM 02/08/2024    4:17 PM 02/08/2024    4:07 PM  BP/Weight  Systolic BP 106 140 120  Diastolic BP 68 80 80  Wt. (Lbs) 191.4    BMI 32.85 kg/m2      Physical Exam Vitals and nursing note reviewed.  Constitutional:      Appearance: She is obese.  HENT:     Head: Normocephalic and atraumatic.  Cardiovascular:     Rate and Rhythm: Normal rate and regular rhythm.  Pulmonary:     Effort: Pulmonary effort is normal.     Breath sounds: Normal breath sounds.  Musculoskeletal:        General: Normal range of motion.  Neurological:     General: No focal deficit present.     Mental Status: She is alert.  Psychiatric:        Mood and Affect: Mood normal.     Diabetic Foot Exam - Simple   No data filed      Lab Results  Component Value Date   WBC 6.6 03/17/2024   HGB 13.6 03/17/2024   HCT 41.7 03/17/2024   PLT 293 03/17/2024   GLUCOSE 88 03/17/2024   CHOL 265 (H) 03/17/2024   TRIG 475 (H) 03/17/2024   HDL 39 (L) 03/17/2024   LDLDIRECT 73.0 01/30/2022   LDLCALC 138 (H)  03/17/2024   ALT 12 03/17/2024   AST 16 03/17/2024   NA 141 03/17/2024   K 5.0 03/17/2024   CL 101 03/17/2024   CREATININE 1.21 (H) 03/17/2024   BUN 12 03/17/2024   CO2 23 03/17/2024   TSH 1.450 03/17/2024   HGBA1C 5.3 03/17/2024      Assessment & Plan:  Assessment and Plan   Assessment and Plan              External hemorrhoid, bleeding Assessment & Plan: Persistent external and internal hemorrhoids with symptoms of itching, burning, pain, and regular bleeding. Previous conservative treatments, including hydrocortisone  cream, sitz baths, and increased water intake, have been ineffective. Colonoscopy  confirmed presence of hemorrhoids. Referral to [facility] in [place] for further management. - Refer to [facility] in [place] for management of hemorrhoids - Continue sitz baths and increased water intake - she cannot reportedly take excess fiber due to diarrhea,  Orders: -     CMP14+EGFR -     TSH -     Ambulatory referral to Colorectal Surgery -     CBC with Differential/Platelet -     Comprehensive metabolic panel with GFR -     Hemoglobin A1c -     Lipid panel -     Sedimentation rate -     C-reactive protein  Mixed hyperlipidemia Assessment & Plan: Hyperlipidemia with previously elevated cholesterol levels. She reports taking pravastatin  regularly but experienced pain, possibly a side effect. Discussed trying a stronger statin and taking it at night to mitigate dizziness. - Order blood work to check cholesterol levels  Orders: -     CMP14+EGFR -     TSH -     Ambulatory referral to Colorectal Surgery -     CBC with Differential/Platelet -     Comprehensive metabolic panel with GFR -     Hemoglobin A1c -     Lipid panel -     Sedimentation rate -     C-reactive protein  Other fatigue Assessment & Plan: Chronic. Multifactorial.    Postural Orthostatic Tachycardia Syndrome (POTS) POTS with symptoms of dizziness and seeing stars upon standing. Metoprolol  is used to manage symptoms and prevent elevated pulse rate. - Refill metoprolol  prescription  Raynaud's phenomenon Symptoms consistent with Raynaud's phenomenon, including purple feet and cold toes due to circulation issues.  Diverticulosis Diverticulosis typically causes painless bleeding or severe pain if diverticulitis occurs. Current symptoms are not attributed to diverticulosis.  General Health Maintenance Overdue for mammogram. Fasting blood work planned to assess cholesterol and inflammation markers. - Order mammogram on [facility] mobile mammogram bus - Perform fasting blood  work      Orders: -     CMP14+EGFR -     TSH -     Ambulatory referral to Colorectal Surgery -     CBC with Differential/Platelet -     Comprehensive metabolic panel with GFR -     Hemoglobin A1c -     Lipid panel -     Sedimentation rate -     C-reactive protein  Chronic neck pain -     CMP14+EGFR -     TSH -     Ambulatory referral to Colorectal Surgery -     CBC with Differential/Platelet -     Comprehensive metabolic panel with GFR -     Hemoglobin A1c -     Lipid panel -     Sedimentation rate -  C-reactive protein  Fibromyalgia Assessment & Plan: Chronic condition with symptoms including neck pain and fatigue. Chronic pain and fatigue may overlap with fibromyalgia symptoms. Elevated ANA, sed rate, and CRP in the past suggest further investigation is warranted. - Order sed rate and CRP to assess for other conditions  Orders: -     CMP14+EGFR -     TSH -     Ambulatory referral to Colorectal Surgery -     CBC with Differential/Platelet -     Comprehensive metabolic panel with GFR -     Hemoglobin A1c -     Lipid panel -     Sedimentation rate -     C-reactive protein -     traMADol  HCl; Take 1 tablet (50 mg total) by mouth every 12 (twelve) hours as needed for moderate pain (pain score 4-6) or severe pain (pain score 7-10).  Dispense: 60 tablet; Refill: 0  Encounter for screening mammogram for malignant neoplasm of breast -     3D Screening Mammogram, Left and Right; Future  Sinus tachycardia Assessment & Plan: Well controlled on metoprolol   Orders: -     Metoprolol  Tartrate; Take 1 tablet (25 mg total) by mouth 2 (two) times daily.  Dispense: 180 tablet; Refill: 3     Meds ordered this encounter  Medications   traMADol  (ULTRAM ) 50 MG tablet    Sig: Take 1 tablet (50 mg total) by mouth every 12 (twelve) hours as needed for moderate pain (pain score 4-6) or severe pain (pain score 7-10).    Dispense:  60 tablet    Refill:  0   metoprolol  tartrate  (LOPRESSOR ) 25 MG tablet    Sig: Take 1 tablet (25 mg total) by mouth 2 (two) times daily.    Dispense:  180 tablet    Refill:  3    Orders Placed This Encounter  Procedures   MM 3D SCREENING MAMMOGRAM BILATERAL BREAST   CMP14+EGFR   TSH   CBC with Differential/Platelet   Comprehensive metabolic panel with GFR   Hemoglobin A1c   Lipid panel   Sedimentation rate   C-reactive protein   Ambulatory referral to Colorectal Surgery     Follow-up: Return in about 3 months (around 06/16/2024), or if symptoms worsen or fail to improve, for chronic disease follow up.   I,Angela Taylor,acting as a Neurosurgeon for Cordera Stineman, MD.,have documented all relevant documentation on the behalf of Tonianne Fine, MD,as directed by  Maninder Deboer, MD while in the presence of Loistine Rinne, MD.   An After Visit Summary was printed and given to the patient.  Karessa Onorato, MD [facility] [phone]

## 2024-03-17 NOTE — Patient Instructions (Signed)
 VISIT SUMMARY:  Today, you were seen for persistent hemorrhoid symptoms, chronic neck pain, fibromyalgia, hyperlipidemia, and POTS. We discussed your ongoing symptoms and reviewed your current treatment plan. Additionally, we addressed your general health maintenance needs.  YOUR PLAN:  -EXTERNAL AND INTERNAL HEMORRHOIDS: Hemorrhoids are swollen veins in your lower rectum and anus, causing itching, burning, pain, and bleeding. Since previous treatments have not been effective, you are being referred to [facility] in [place] for further management. Continue with sitz baths and increased water intake.  -CHRONIC NECK PAIN: Chronic neck pain, often associated with fibromyalgia, can be severe and persistent. You have been prescribed 60 tablets of tramadol  to help manage the pain.  -FIBROMYALGIA: Fibromyalgia is a chronic condition characterized by widespread pain and fatigue. To further investigate your symptoms, we will order sed rate and CRP tests to check for other conditions.  -HYPERLIPIDEMIA: Hyperlipidemia is a condition of elevated cholesterol levels in your blood. We will order blood work to check your cholesterol levels and discuss the possibility of trying a stronger statin, taken at night, to reduce dizziness.  -POSTURAL ORTHOSTATIC TACHYCARDIA SYNDROME (POTS): POTS is a condition that affects circulation, leading to dizziness and seeing stars upon standing. Your metoprolol  prescription will be refilled to help manage these symptoms.  -RAYNAUD'S PHENOMENON: Raynaud's phenomenon is a condition where some areas of your body, like your feet and toes, feel numb and cold in response to cold temperatures or stress.  -DIVERTICULOSIS: Diverticulosis involves small, bulging pouches in your digestive tract, which can cause painless bleeding or severe pain if they become inflamed. Currently, you do not have symptoms related to this condition.  -GENERAL HEALTH MAINTENANCE: You are  overdue for a mammogram, and we will order this on the [facility] mobile mammogram bus. Additionally, we will perform fasting blood work to assess your cholesterol and inflammation markers.  INSTRUCTIONS:  Please follow up with [facility] in [place] for your hemorrhoid management. Continue with sitz baths and increased water intake. Take tramadol  as prescribed for neck pain. Complete the sed rate and CRP tests, as well as the fasting blood work for cholesterol levels. Schedule your mammogram on the [facility] mobile mammogram bus. Refill your metoprolol  prescription as needed.

## 2024-03-18 ENCOUNTER — Other Ambulatory Visit: Payer: Self-pay

## 2024-03-18 ENCOUNTER — Telehealth: Payer: Self-pay

## 2024-03-18 DIAGNOSIS — E782 Mixed hyperlipidemia: Secondary | ICD-10-CM

## 2024-03-18 LAB — CMP14+EGFR
ALT: 12 IU/L (ref 0–32)
AST: 16 IU/L (ref 0–40)
Albumin: 4.7 g/dL (ref 3.8–4.9)
Alkaline Phosphatase: 76 IU/L (ref 44–121)
BUN/Creatinine Ratio: 10 — ABNORMAL LOW (ref 12–28)
BUN: 12 mg/dL (ref 8–27)
Bilirubin Total: 1 mg/dL (ref 0.0–1.2)
CO2: 23 mmol/L (ref 20–29)
Calcium: 10 mg/dL (ref 8.7–10.3)
Chloride: 101 mmol/L (ref 96–106)
Creatinine, Ser: 1.21 mg/dL — ABNORMAL HIGH (ref 0.57–1.00)
Globulin, Total: 3 g/dL (ref 1.5–4.5)
Glucose: 88 mg/dL (ref 70–99)
Potassium: 5 mmol/L (ref 3.5–5.2)
Sodium: 141 mmol/L (ref 134–144)
Total Protein: 7.7 g/dL (ref 6.0–8.5)
eGFR: 51 mL/min/{1.73_m2} — ABNORMAL LOW (ref >59)

## 2024-03-18 LAB — CBC WITH DIFFERENTIAL/PLATELET
Basophils Absolute: 0.1 10*3/uL (ref 0.0–0.2)
Basos: 1 %
EOS (ABSOLUTE): 0.2 10*3/uL (ref 0.0–0.4)
Eos: 3 %
Hematocrit: 41.7 % (ref 34.0–46.6)
Hemoglobin: 13.6 g/dL (ref 11.1–15.9)
Immature Grans (Abs): 0 10*3/uL (ref 0.0–0.1)
Immature Granulocytes: 0 %
Lymphocytes Absolute: 1.9 10*3/uL (ref 0.7–3.1)
Lymphs: 28 %
MCH: 28.8 pg (ref 26.6–33.0)
MCHC: 32.6 g/dL (ref 31.5–35.7)
MCV: 88 fL (ref 79–97)
Monocytes Absolute: 0.5 10*3/uL (ref 0.1–0.9)
Monocytes: 7 %
Neutrophils Absolute: 4 10*3/uL (ref 1.4–7.0)
Neutrophils: 61 %
Platelets: 293 10*3/uL (ref 150–450)
RBC: 4.73 x10E6/uL (ref 3.77–5.28)
RDW: 13.3 % (ref 11.7–15.4)
WBC: 6.6 10*3/uL (ref 3.4–10.8)

## 2024-03-18 LAB — SEDIMENTATION RATE: Sed Rate: 17 mm/h (ref 0–40)

## 2024-03-18 LAB — LIPID PANEL
Chol/HDL Ratio: 6.8 ratio — ABNORMAL HIGH (ref 0.0–4.4)
Cholesterol, Total: 265 mg/dL — ABNORMAL HIGH (ref 100–199)
HDL: 39 mg/dL — ABNORMAL LOW (ref >39)
LDL Chol Calc (NIH): 138 mg/dL — ABNORMAL HIGH (ref 0–99)
Triglycerides: 475 mg/dL — ABNORMAL HIGH (ref 0–149)
VLDL Cholesterol Cal: 88 mg/dL — ABNORMAL HIGH (ref 5–40)

## 2024-03-18 LAB — C-REACTIVE PROTEIN: CRP: 2 mg/L (ref 0–10)

## 2024-03-18 LAB — HEMOGLOBIN A1C
Est. average glucose Bld gHb Est-mCnc: 105 mg/dL
Hgb A1c MFr Bld: 5.3 % (ref 4.8–5.6)

## 2024-03-18 LAB — TSH: TSH: 1.45 u[IU]/mL (ref 0.450–4.500)

## 2024-03-18 MED ORDER — ATORVASTATIN CALCIUM 40 MG PO TABS: 40.0000 mg | ORAL_TABLET | Freq: Every day | ORAL | 1 refills | Status: DC

## 2024-03-18 NOTE — Telephone Encounter (Signed)
 Copied from CRM [id number]. Topic: Clinical - Prescription Issue >> Mar 18, 2024  9:58 AM Zina Hilts wrote: Reason for CRM: Patient calling stated the Insurance is requesting a PA for the traMADol  (ULTRAM ) 50 MG tablet prescribed for patient. Pleas call [facility] @ [phone] and Provider [employer]  [phone]

## 2024-03-18 NOTE — Assessment & Plan Note (Signed)
 Well controlled on metoprolol.

## 2024-03-18 NOTE — Assessment & Plan Note (Signed)
 Hyperlipidemia with previously elevated cholesterol levels. She reports taking pravastatin  regularly but experienced pain, possibly a side effect. Discussed trying a stronger statin and taking it at night to mitigate dizziness. - Order blood work to check cholesterol levels

## 2024-03-18 NOTE — Assessment & Plan Note (Addendum)
 Persistent external and internal hemorrhoids with symptoms of itching, burning, pain, and regular bleeding. Previous conservative treatments, including hydrocortisone  cream, sitz baths, and increased water intake, have been ineffective. Colonoscopy confirmed presence of hemorrhoids. Referral to [facility] in [place] for further management. - Refer to [facility] in [place] for management of hemorrhoids - Continue sitz baths and increased water intake - she cannot reportedly take excess fiber due to diarrhea,

## 2024-03-18 NOTE — Assessment & Plan Note (Signed)
 Chronic condition with symptoms including neck pain and fatigue. Chronic pain and fatigue may overlap with fibromyalgia symptoms. Elevated ANA, sed rate, and CRP in the past suggest further investigation is warranted. - Order sed rate and CRP to assess for other conditions

## 2024-03-18 NOTE — Assessment & Plan Note (Signed)
 Chronic. Multifactorial.    Postural Orthostatic Tachycardia Syndrome (POTS) POTS with symptoms of dizziness and seeing stars upon standing. Metoprolol  is used to manage symptoms and prevent elevated pulse rate. - Refill metoprolol  prescription  Raynaud's phenomenon Symptoms consistent with Raynaud's phenomenon, including purple feet and cold toes due to circulation issues.  Diverticulosis Diverticulosis typically causes painless bleeding or severe pain if diverticulitis occurs. Current symptoms are not attributed to diverticulosis.  General Health Maintenance Overdue for mammogram. Fasting blood work planned to assess cholesterol and inflammation markers. - Order mammogram on [facility] mobile mammogram bus - Perform fasting blood work

## 2024-03-19 ENCOUNTER — Ambulatory Visit
Admission: RE | Admit: 2024-03-19 | Discharge: 2024-03-19 | Disposition: A | Discharge status: Home / Self Care | Source: Ambulatory Visit

## 2024-03-19 DIAGNOSIS — Z1231 Encounter for screening mammogram for malignant neoplasm of breast: Secondary | ICD-10-CM | POA: Diagnosis not present

## 2024-03-19 NOTE — Telephone Encounter (Signed)
 Sent patient my chart message waiting on insurance response to PA

## 2024-03-31 ENCOUNTER — Telehealth: Payer: Self-pay

## 2024-03-31 NOTE — Telephone Encounter (Signed)
 Called [facility] and made them aware PA was approved for 60 tablets and stated they stated that they ran it through insurance and it did go through, Patient may come pick up medication once she gets an alert from them that it is ready.  Patient made aware of this also.   Copied from CRM [id number]. Topic: Clinical - Prescription Issue >> Mar 28, 2024  9:57 AM Magdalene School wrote: Reason for CRM: Patient's husband, Mara Seminole, is calling regarding traMADol  (ULTRAM ) 50 MG tablet prescription stating that the insurance needs to speak with the doctor before authorizing this medication. They authorized 7 days worth of medication on 03/18/24 and when the patient tried to refill this week, she was told they could not refill until they speak to doctor.

## 2024-06-05 ENCOUNTER — Other Ambulatory Visit: Payer: Self-pay

## 2024-06-05 DIAGNOSIS — M797 Fibromyalgia: Secondary | ICD-10-CM

## 2024-06-23 ENCOUNTER — Ambulatory Visit (INDEPENDENT_AMBULATORY_CARE_PROVIDER_SITE_OTHER)

## 2024-06-23 VITALS — BP 110/82 | HR 81 | Temp 97.2°F | Ht 64.0 in | Wt 185.0 lb

## 2024-06-23 DIAGNOSIS — M797 Fibromyalgia: Secondary | ICD-10-CM

## 2024-06-23 DIAGNOSIS — G8929 Other chronic pain: Secondary | ICD-10-CM

## 2024-06-23 DIAGNOSIS — M542 Cervicalgia: Secondary | ICD-10-CM

## 2024-06-23 DIAGNOSIS — E782 Mixed hyperlipidemia: Secondary | ICD-10-CM

## 2024-06-23 DIAGNOSIS — R944 Abnormal results of kidney function studies: Secondary | ICD-10-CM | POA: Insufficient documentation

## 2024-06-23 NOTE — Assessment & Plan Note (Signed)
 Chronic pain due to fibromyalgia, with pain reported in the neck and generalized body pain. She is taking tramadol  once or twice daily for pain management and is engaging in regular exercise, including [place] walking workouts. Regular exercise is encouraged as it helps manage symptoms. - Continue current tramadol  regimen for pain management - Encourage regular exercise as tolerated

## 2024-06-23 NOTE — Patient Instructions (Signed)
  VISIT SUMMARY: Today, we discussed your concerns about worsening kidney function, reviewed your chronic conditions, and planned follow-up tests and treatments.  YOUR PLAN: CHRONIC KIDNEY DISEASE: Your kidney function has been fluctuating, and you have noticed changes in your urine. We need to investigate further to understand the cause. -We will order blood work to assess your kidney function. -We will also order a urine test to check for protein in your urine. -Please make sure to stay well-hydrated.  HYPERLIPIDEMIA: Your cholesterol levels were high in your last test, but you are taking atorvastatin , which is helping. Your recent weight loss is also beneficial. -We will repeat blood work to check your current cholesterol levels. -Continue taking atorvastatin  as prescribed. -We will evaluate your blood work results before making any changes to your medication.  HYPERTENSION: Your blood pressure is well-controlled with your current medication. -Continue taking metoprolol  25 mg twice daily.  FIBROMYALGIA: You have chronic pain due to fibromyalgia, and you are managing it with tramadol  and regular exercise. -Continue taking tramadol  as needed for pain. -Keep up with your regular exercise routine.  GENERAL HEALTH MAINTENANCE: You are up to date with your mammogram and have been maintaining a healthy lifestyle with regular exercise and weight loss. -Continue your regular exercise and healthy lifestyle. -Monitor your weight and aim for further weight loss if needed.  FOLLOW-UP: We need to follow up based on your lab results and current health status. -We will perform blood and urine tests today. -We will follow up with you regarding your test results. -Schedule your next routine follow-up in six months if your lab results are stable.                      Contains text generated by [employer].                                 Contains text  generated by [employer].

## 2024-06-23 NOTE — Progress Notes (Signed)
 Subjective:  Patient ID: Margaret Wood, female    DOB: [birth year]  Age: 63 y.o. MRN: [id number]  Chief Complaint  Patient presents with   Medical Management of Chronic Issues    HPI: Discussed the use of AI scribe software for clinical note transcription with the patient, who gave verbal consent to proceed.  History of Present Illness   Margaret Wood is a 63 year old female here for follow up of chronic medical problems, repeat labs etc. She presents with concerns about worsening kidney function.  Renal dysfunction -  concern for worsening renal function - reports Urine has a malodorous smell in the mornings for the past 1 to 1.5 months - Urine appears slightly darker than usual - No dysuria or urinary pain - Kidney function below 60 in November 2023, improved in March 2024, decreased to 51 in April 2024 - Concern about progressive decline in renal function  Dyslipidemia - Total cholesterol 265 mg/dL in April 2025 - Triglycerides 475 mg/dL in April 2025 - LDL 861 mg/dL in April 2025 - Currently taking atorvastatin  daily without side effects  Chronic pain and fibromyalgia - History of fibromyalgia with pain in the neck and throughout the body - Uses tramadol  once or twice daily for pain management - Regular exercise with [employer] walking workout - Weight loss of approximately 10 pounds since April 2025  Cardiovascular risk factors - Currently taking metoprolol  25 mg twice daily  Psychological symptoms - Social anxiety limits ability to exercise outside or in a gym setting  Other relevant history - No history of lupus - Elevated sedimentation rate in the past - No alcohol or tobacco use         08/13/2023    3:43 PM 02/06/2023   11:17 AM 08/02/2022    2:16 PM 01/30/2022    3:59 PM 02/10/2020    2:44 PM  Depression screen PHQ 2/9  Decreased Interest 0 0 0 1 1  Down, Depressed, Hopeless 0 1 0 1 1  PHQ - 2 Score 0 1 0 2 2   Altered sleeping 0  3 1 1   Tired, decreased energy 3  3 2 2   Change in appetite 0  1 1 0  Feeling bad or failure about yourself  0  0 1 1  Trouble concentrating 3  3 1 1   Moving slowly or fidgety/restless 0  0 0 0  Suicidal thoughts 0  0 0 0  PHQ-9 Score 6  10 8 7   Difficult doing work/chores Not difficult at all Somewhat difficult Somewhat difficult Somewhat difficult Somewhat difficult        06/23/2024    1:53 PM  Fall Risk   Falls in the past year? 0  Number falls in past yr: 0  Injury with Fall? 0  Risk for fall due to : No Fall Risks  Follow up Falls evaluation completed    Patient Care Team: Anias Bartol, MD as PCP - General (Family Medicine)   Review of Systems  Constitutional:  Negative for appetite change, fatigue and fever.  HENT:  Negative for congestion, ear pain, sinus pressure and sore throat.   Respiratory:  Negative for cough, chest tightness, shortness of breath and wheezing.   Cardiovascular:  Negative for chest pain and palpitations.  Gastrointestinal:  Negative for abdominal pain, constipation, diarrhea, nausea and vomiting.  Genitourinary:  Negative for dysuria and hematuria.  Musculoskeletal:  Positive for arthralgias and neck pain. Negative for back pain, joint  swelling and myalgias.  Skin:  Negative for rash.  Neurological:  Negative for dizziness, weakness and headaches.  Psychiatric/Behavioral:  Negative for dysphoric mood. The patient is not nervous/anxious.     Current Outpatient Medications on File Prior to Visit  Medication Sig Dispense Refill   atorvastatin  (LIPITOR) 40 MG tablet Take 1 tablet (40 mg total) by mouth at bedtime. 90 tablet 1   cyclobenzaprine  (FLEXERIL ) 10 MG tablet TAKE 1 TABLET BY MOUTH AT BEDTIME AS NEEDED. 30 tablet 0   hydrocortisone  (ANUSOL -HC) 2.5 % rectal cream Place 1 Application rectally 2 (two) times daily. 270 g 0   hydrocortisone  (ANUSOL -HC) 25 MG suppository Place 1 suppository (25 mg total) rectally 2 (two)  times daily as needed for hemorrhoids or anal itching. 12 suppository 5   metoprolol  tartrate (LOPRESSOR ) 25 MG tablet Take 1 tablet (25 mg total) by mouth 2 (two) times daily. 180 tablet 3   pantoprazole  (PROTONIX ) 40 MG tablet TAKE 1 TABLET BY MOUTH EVERY DAY AS NEEDED FOR GERD 90 tablet 0   promethazine  (PHENERGAN ) 12.5 MG tablet TAKE 1 TABLET(12.5 MG) BY MOUTH EVERY 6 HOURS AS NEEDED FOR NAUSEA OR VOMITING 30 tablet 0   traMADol  (ULTRAM ) 50 MG tablet Take 1 tablet (50 mg total) by mouth every 12 (twelve) hours as needed. 60 tablet 0   No current facility-administered medications on file prior to visit.   Past Medical History:  Diagnosis Date   Anxiety    Arthritis    Depression    Fibromyalgia    Gallstones    GERD (gastroesophageal reflux disease)    H/O [facility] spotted fever    Hyperlipidemia    IBS (irritable bowel syndrome)    Mitral valve prolapse    Palpitations    Scoliosis    Serotonin syndrome    Past Surgical History:  Procedure Laterality Date   APPENDECTOMY  1980   CESAREAN SECTION  1995   CHOLECYSTECTOMY  1997    Family History  Problem Relation Age of Onset   Depression Mother    Anxiety disorder Mother    Osteoarthritis Mother    Brain cancer Father        GBM   Alcohol abuse Sister    Anxiety disorder Sister    Diabetes Brother    Diverticulitis Brother    Heart disease Brother    Alcohol abuse Brother    Healthy Brother    Brain cancer Paternal Aunt    Arrhythmia Paternal Uncle    Lung cancer Maternal Grandmother    Lung cancer Maternal Grandfather    Brain cancer Paternal Grandfather        GBM   Colon cancer Neg Hx    Breast cancer Neg Hx    Ovarian cancer Neg Hx    Colon polyps Neg Hx    Esophageal cancer Neg Hx    Rectal cancer Neg Hx    Stomach cancer Neg Hx    Social History   Socioeconomic History   Marital status: Married    Spouse name: Garnette   Number of children: 2   Years of education: 12   Highest education  level: GED or equivalent  Occupational History   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  Tobacco Use   Smoking status: Never   Smokeless tobacco: Never  Vaping Use   Vaping status: Never Used  Substance and Sexual Activity   Alcohol use: Not Currently  Drug use: No   Sexual activity: Yes    Partners: Male    Birth control/protection: None, Patch, Post-menopausal  Other Topics Concern   Not on file  Social History Narrative   02/10/20   From: From [place], [place]   Living: with husband stephen and one son   Work: no, disability       Family: 2 sons - Josefa and Ole (twin boys 1995)      Enjoys: reading, play banjo, crochet      Exercise: just got an exercise bike, and planning to walk when the weather is nicer   Diet: drinks diet soda, eats yogurt, trying to limit red meat - eating beyond beef      Safety   Seat belts: Yes    Guns: No   Safe in relationships: Yes    Social Drivers of Corporate investment banker Strain: Low Risk  (11/15/2023)   Overall Financial Resource Strain (CARDIA)    Difficulty of Paying Living Expenses: Not very hard  Food Insecurity: No Food Insecurity (11/15/2023)   Hunger Vital Sign    Worried About Running Out of Food in the Last Year: Never true    Ran Out of Food in the Last Year: Never true  Transportation Needs: No Transportation Needs (11/15/2023)   PRAPARE - Administrator, Civil Service (Medical): No    Lack of Transportation (Non-Medical): No  Physical Activity: Insufficiently Active (11/15/2023)   Exercise Vital Sign    Days of Exercise per Week: 3 days    Minutes of Exercise per Session: 30 min  Stress: Stress Concern Present (11/15/2023)   [employer] of Occupational Health - Occupational Stress Questionnaire    Feeling of Stress : Very much  Social Connections: Unknown (11/15/2023)   Social Connection and Isolation Panel    Frequency of Communication with Friends  and Family: Patient declined    Frequency of Social Gatherings with Friends and Family: Twice a week    Attends Religious Services: Never    Diplomatic Services operational officer: No    Attends Engineer, structural: Not on file    Marital Status: Married    Objective:  BP 110/82 (BP Location: Left Arm, Patient Position: Sitting)   Pulse 81   Temp (!) 97.2 F (36.2 C) (Temporal)   Ht 5' 4 (1.626 m)   Wt 185 lb (83.9 kg)   SpO2 98%   BMI 31.76 kg/m      06/23/2024    1:55 PM 03/17/2024    3:34 PM 02/08/2024    4:17 PM  BP/Weight  Systolic BP 110 106 140  Diastolic BP 82 68 80  Wt. (Lbs) 185 191.4   BMI 31.76 kg/m2 32.85 kg/m2     Physical Exam Vitals and nursing note reviewed.  Constitutional:      Appearance: She is obese.  HENT:     Head: Normocephalic and atraumatic.  Cardiovascular:     Rate and Rhythm: Normal rate and regular rhythm.  Pulmonary:     Effort: Pulmonary effort is normal.     Breath sounds: Normal breath sounds.  Musculoskeletal:        General: Normal range of motion.  Neurological:     General: No focal deficit present.     Mental Status: She is alert.  Psychiatric:        Mood and Affect: Mood normal.       Lab Results  Component Value Date  WBC 6.6 03/17/2024   HGB 13.6 03/17/2024   HCT 41.7 03/17/2024   PLT 293 03/17/2024   GLUCOSE 88 03/17/2024   CHOL 265 (H) 03/17/2024   TRIG 475 (H) 03/17/2024   HDL 39 (L) 03/17/2024   LDLDIRECT 73.0 01/30/2022   LDLCALC 138 (H) 03/17/2024   ALT 12 03/17/2024   AST 16 03/17/2024   NA 141 03/17/2024   K 5.0 03/17/2024   CL 101 03/17/2024   CREATININE 1.21 (H) 03/17/2024   BUN 12 03/17/2024   CO2 23 03/17/2024   TSH 1.450 03/17/2024   HGBA1C 5.3 03/17/2024      Assessment & Plan:  Mixed hyperlipidemia Assessment & Plan: Hyperlipidemia with previous lab results showing total cholesterol of 265 mg/dL, triglycerides of 524 mg/dL, and LDL of 861 mg/dL. She is on  atorvastatin  and reports no side effects. Recent weight loss may positively impact lipid levels. Atorvastatin  is life-saving and should be continued regardless of improved lab results, as improvements are attributed to the medication. - Repeat blood work to assess current cholesterol levels - Continue atorvastatin  therapy at 40 mg daily - Evaluate blood work results before deciding on any changes to atorvastatin  dosage  Orders: -     Lipid panel  Fibromyalgia Assessment & Plan: Chronic pain due to fibromyalgia, with pain reported in the neck and generalized body pain. She is taking tramadol  once or twice daily for pain management and is engaging in regular exercise, including [employer] [place] walking workouts. Regular exercise is encouraged as it helps manage symptoms. - Continue current tramadol  regimen for pain management - Encourage regular exercise as tolerated   Chronic neck pain Assessment & Plan: On tramadol .  Does not need a refill today Takes it as needed, but ends up needing at least 2 per day Aware of dependence. No suspicion for overuse or abuse   Decreased GFR Assessment & Plan: Decreased GFR with fluctuating kidney function. Recent lab results indicate a GFR of 51 mL/min/1.73 m. She reports new symptoms of malodorous and darker urine for about a month, without dysuria. Differential diagnosis includes possible proteinuria due to kidney disease. Contributing factors include hypertension and age. - Order blood work to assess kidney function - Order urine test to check for proteinuria - Advise maintaining adequate hydration  Orders: -     CBC with Differential/Platelet -     Comprehensive metabolic panel with GFR -     Microalbumin / creatinine urine ratio    Assessment and Plan    General Health Maintenance Up to date with mammogram as of April 23rd. Recent weight loss of approximately 7 pounds since April 21st. Engaging in regular exercise and maintaining a healthy  lifestyle. Social anxiety may impact her ability to exercise outside or in a gym, so home-based routines are recommended. - Encourage continued regular exercise and healthy lifestyle - Monitor weight and encourage further weight loss if needed  Follow-up Follow-up plan based on lab results and current health status. - Perform blood and urine tests today - Follow up with her regarding test results - Schedule next routine follow-up in six months if lab results are stable       No orders of the defined types were placed in this encounter.   Orders Placed This Encounter  Procedures   CBC with Differential/Platelet   Comprehensive metabolic panel with GFR   Lipid panel   Microalbumin / creatinine urine ratio     Follow-up: Return in about 6 months (around 12/24/2024) for chronic  disease follow up.  An After Visit Summary was printed and given to the patient.  Megann Easterwood, MD [facility] [phone]

## 2024-06-23 NOTE — Assessment & Plan Note (Signed)
 Hyperlipidemia with previous lab results showing total cholesterol of 265 mg/dL, triglycerides of 524 mg/dL, and LDL of 861 mg/dL. She is on atorvastatin  and reports no side effects. Recent weight loss may positively impact lipid levels. Atorvastatin  is life-saving and should be continued regardless of improved lab results, as improvements are attributed to the medication. - Repeat blood work to assess current cholesterol levels - Continue atorvastatin  therapy at 40 mg daily - Evaluate blood work results before deciding on any changes to atorvastatin  dosage

## 2024-06-23 NOTE — Assessment & Plan Note (Signed)
 On tramadol .  Does not need a refill today Takes it as needed, but ends up needing at least 2 per day Aware of dependence. No suspicion for overuse or abuse

## 2024-06-23 NOTE — Assessment & Plan Note (Signed)
 Decreased GFR with fluctuating kidney function. Recent lab results indicate a GFR of 51 mL/min/1.73 m. She reports new symptoms of malodorous and darker urine for about a month, without dysuria. Differential diagnosis includes possible proteinuria due to kidney disease. Contributing factors include hypertension and age. - Order blood work to assess kidney function - Order urine test to check for proteinuria - Advise maintaining adequate hydration

## 2024-06-24 ENCOUNTER — Ambulatory Visit: Payer: Self-pay

## 2024-06-24 LAB — COMPREHENSIVE METABOLIC PANEL WITH GFR
ALT: 50 IU/L — ABNORMAL HIGH (ref 0–32)
AST: 50 IU/L — ABNORMAL HIGH (ref 0–40)
Albumin: 4.6 g/dL (ref 3.9–4.9)
Alkaline Phosphatase: 328 IU/L — ABNORMAL HIGH (ref 44–121)
BUN/Creatinine Ratio: 9 — ABNORMAL LOW (ref 12–28)
BUN: 9 mg/dL (ref 8–27)
Bilirubin Total: 1.3 mg/dL — ABNORMAL HIGH (ref 0.0–1.2)
CO2: 22 mmol/L (ref 20–29)
Calcium: 9.6 mg/dL (ref 8.7–10.3)
Chloride: 102 mmol/L (ref 96–106)
Creatinine, Ser: 0.95 mg/dL (ref 0.57–1.00)
Globulin, Total: 2.6 g/dL (ref 1.5–4.5)
Glucose: 88 mg/dL (ref 70–99)
Potassium: 4.3 mmol/L (ref 3.5–5.2)
Sodium: 139 mmol/L (ref 134–144)
Total Protein: 7.2 g/dL (ref 6.0–8.5)
eGFR: 68 mL/min/1.73 (ref >59)

## 2024-06-24 LAB — MICROALBUMIN / CREATININE URINE RATIO
Creatinine, Urine: 24.4 mg/dL
Microalb/Creat Ratio: <12 mg/g{creat} (ref 0–29)
Microalbumin, Urine: <3 ug/mL

## 2024-06-24 LAB — CBC WITH DIFFERENTIAL/PLATELET
Basophils Absolute: 0.1 x10E3/uL (ref 0.0–0.2)
Basos: 1 %
EOS (ABSOLUTE): 0.1 x10E3/uL (ref 0.0–0.4)
Eos: 2 %
Hematocrit: 43.6 % (ref 34.0–46.6)
Hemoglobin: 13.7 g/dL (ref 11.1–15.9)
Immature Grans (Abs): 0 x10E3/uL (ref 0.0–0.1)
Immature Granulocytes: 0 %
Lymphocytes Absolute: 1.8 x10E3/uL (ref 0.7–3.1)
Lymphs: 34 %
MCH: 29 pg (ref 26.6–33.0)
MCHC: 31.4 g/dL — ABNORMAL LOW (ref 31.5–35.7)
MCV: 92 fL (ref 79–97)
Monocytes Absolute: 0.4 x10E3/uL (ref 0.1–0.9)
Monocytes: 8 %
Neutrophils Absolute: 2.9 x10E3/uL (ref 1.4–7.0)
Neutrophils: 55 %
Platelets: 256 x10E3/uL (ref 150–450)
RBC: 4.73 x10E6/uL (ref 3.77–5.28)
RDW: 13.2 % (ref 11.7–15.4)
WBC: 5.4 x10E3/uL (ref 3.4–10.8)

## 2024-06-24 LAB — LIPID PANEL
Chol/HDL Ratio: 2.9 ratio (ref 0.0–4.4)
Cholesterol, Total: 160 mg/dL (ref 100–199)
HDL: 56 mg/dL (ref >39)
LDL Chol Calc (NIH): 74 mg/dL (ref 0–99)
Triglycerides: 177 mg/dL — ABNORMAL HIGH (ref 0–149)
VLDL Cholesterol Cal: 30 mg/dL (ref 5–40)

## 2024-06-26 ENCOUNTER — Telehealth: Payer: Self-pay

## 2024-06-26 ENCOUNTER — Other Ambulatory Visit: Payer: Self-pay

## 2024-06-26 DIAGNOSIS — E782 Mixed hyperlipidemia: Secondary | ICD-10-CM

## 2024-06-26 MED ORDER — REPATHA SURECLICK 140 MG/ML ~~LOC~~ SOAJ: 140.0000 mg | SUBCUTANEOUS | 2 refills | Status: DC

## 2024-06-26 NOTE — Telephone Encounter (Signed)
 Working on PA now  Copied from [employer] [phone]. Topic: Clinical - Medication Question >> Jun 26, 2024  9:32 AM Marissa P wrote: Reason for CRM: Husband is calling saying they are needing a pre authorization for the Repatha  and they are needing that from doctor Sirivol.

## 2024-06-27 ENCOUNTER — Other Ambulatory Visit: Payer: Self-pay

## 2024-06-27 DIAGNOSIS — E782 Mixed hyperlipidemia: Secondary | ICD-10-CM

## 2024-06-30 ENCOUNTER — Telehealth: Payer: Self-pay

## 2024-06-30 ENCOUNTER — Other Ambulatory Visit: Payer: Self-pay

## 2024-06-30 NOTE — Telephone Encounter (Signed)
 [id number] PA resubmitted via covermymeds, notes, labs and drug allergies submitted as well.  Copied from CRM #[id number]. Topic: Clinical - Medication Question >> Jun 30, 2024  1:27 PM Marissa P wrote: Reason for CRM: Insurance needs the medical justification notes for the Repatha  please so she can get her medication approved.

## 2024-06-30 NOTE — Telephone Encounter (Signed)
 Patient called in asking about the Repatha . It was denied by her insurance. Husband stated he spoke with the insurance company and they told him that they would approve because she can not take statins, however she is taking atorvastatin  with NO side effects. I am not sure how to proceed?  There is a letter from insurance in her chart as to why they denied and diagnoses she may need for approval. Please advise.[email or web address]

## 2024-07-03 NOTE — Telephone Encounter (Signed)
 Patient husband is calling to ask if this information can please be resent stating insurance did not receive it verified today.Specifically stated needing LDL, HDL, &Triglycerides

## 2024-07-07 NOTE — Telephone Encounter (Unsigned)
 Copied from CRM [id number]. Topic: Clinical - Medication Question >> Jul 07, 2024  1:07 PM Delon DASEN wrote: Reason for CRM: filed appeal for the Evolocumab  (REPATHA  SURECLICK) 140 MG/ML SOAJ with [employer] [place]- [phone]

## 2024-07-07 NOTE — Telephone Encounter (Signed)
 PA denied. Appeal will need to be submitted by provider.

## 2024-09-04 ENCOUNTER — Other Ambulatory Visit: Payer: Self-pay

## 2024-09-04 DIAGNOSIS — E782 Mixed hyperlipidemia: Secondary | ICD-10-CM

## 2024-09-22 ENCOUNTER — Telehealth

## 2024-09-25 ENCOUNTER — Other Ambulatory Visit: Payer: Self-pay

## 2024-09-25 DIAGNOSIS — M797 Fibromyalgia: Secondary | ICD-10-CM

## 2024-09-27 ENCOUNTER — Other Ambulatory Visit: Payer: Self-pay

## 2024-09-29 ENCOUNTER — Other Ambulatory Visit: Payer: Self-pay

## 2024-09-29 DIAGNOSIS — E782 Mixed hyperlipidemia: Secondary | ICD-10-CM

## 2024-10-01 ENCOUNTER — Other Ambulatory Visit: Payer: Self-pay

## 2024-10-03 ENCOUNTER — Telehealth: Payer: Self-pay

## 2024-10-03 NOTE — Telephone Encounter (Signed)
 PA was submitted and approved for tramadol  via covermymeds. See media for additional info.

## 2024-12-10 ENCOUNTER — Other Ambulatory Visit: Payer: Self-pay

## 2024-12-10 DIAGNOSIS — M797 Fibromyalgia: Secondary | ICD-10-CM

## 2024-12-29 ENCOUNTER — Ambulatory Visit

## 2025-01-05 ENCOUNTER — Ambulatory Visit

## 2025-01-05 ENCOUNTER — Other Ambulatory Visit
